# Patient Record
Sex: Male | Born: 1935 | Race: White | Hispanic: No | Marital: Married | Smoking: Former smoker
Health system: Southern US, Community
[De-identification: ages and names within clinical notes are randomized; demographics above are authoritative.]

## PROBLEM LIST (undated history)

## (undated) DIAGNOSIS — I739 Peripheral vascular disease, unspecified: Secondary | ICD-10-CM

## (undated) DIAGNOSIS — I509 Heart failure, unspecified: Secondary | ICD-10-CM

## (undated) DIAGNOSIS — IMO0002 Reserved for concepts with insufficient information to code with codable children: Secondary | ICD-10-CM

## (undated) DIAGNOSIS — C801 Malignant (primary) neoplasm, unspecified: Secondary | ICD-10-CM

## (undated) DIAGNOSIS — I219 Acute myocardial infarction, unspecified: Secondary | ICD-10-CM

## (undated) DIAGNOSIS — I2510 Atherosclerotic heart disease of native coronary artery without angina pectoris: Secondary | ICD-10-CM

## (undated) DIAGNOSIS — I10 Essential (primary) hypertension: Secondary | ICD-10-CM

## (undated) DIAGNOSIS — I82409 Acute embolism and thrombosis of unspecified deep veins of unspecified lower extremity: Secondary | ICD-10-CM

## (undated) DIAGNOSIS — I6529 Occlusion and stenosis of unspecified carotid artery: Secondary | ICD-10-CM

## (undated) DIAGNOSIS — J449 Chronic obstructive pulmonary disease, unspecified: Secondary | ICD-10-CM

## (undated) DIAGNOSIS — E785 Hyperlipidemia, unspecified: Secondary | ICD-10-CM

## (undated) HISTORY — DX: Occlusion and stenosis of unspecified carotid artery: I65.29

## (undated) HISTORY — DX: Essential (primary) hypertension: I10

## (undated) HISTORY — DX: Acute embolism and thrombosis of unspecified deep veins of unspecified lower extremity: I82.409

## (undated) HISTORY — DX: Hyperlipidemia, unspecified: E78.5

## (undated) HISTORY — PX: PR VEIN BYPASS GRAFT,AORTO-FEM-POP: 35551

## (undated) HISTORY — PX: KIDNEY SURGERY: SHX687

## (undated) HISTORY — DX: Atherosclerotic heart disease of native coronary artery without angina pectoris: I25.10

## (undated) HISTORY — PX: FEMORAL-POPLITEAL BYPASS GRAFT: SHX937

## (undated) HISTORY — DX: Peripheral vascular disease, unspecified: I73.9

## (undated) HISTORY — DX: Malignant (primary) neoplasm, unspecified: C80.1

## (undated) HISTORY — DX: Heart failure, unspecified: I50.9

## (undated) HISTORY — DX: Acute myocardial infarction, unspecified: I21.9

## (undated) HISTORY — DX: Reserved for concepts with insufficient information to code with codable children: IMO0002

## (undated) HISTORY — DX: Chronic obstructive pulmonary disease, unspecified: J44.9

---

## 1993-10-10 HISTORY — PX: AORTIC ARCH REPAIR: SHX256

## 2002-03-16 ENCOUNTER — Encounter: Payer: Self-pay | Admitting: *Deleted

## 2002-03-16 ENCOUNTER — Inpatient Hospital Stay (HOSPITAL_COMMUNITY): Admission: EM | Admit: 2002-03-16 | Discharge: 2002-03-20 | Payer: Self-pay | Admitting: *Deleted

## 2002-03-17 ENCOUNTER — Encounter: Payer: Self-pay | Admitting: *Deleted

## 2002-04-02 ENCOUNTER — Inpatient Hospital Stay (HOSPITAL_COMMUNITY): Admission: RE | Admit: 2002-04-02 | Discharge: 2002-04-04 | Payer: Self-pay | Admitting: *Deleted

## 2002-04-02 ENCOUNTER — Encounter: Payer: Self-pay | Admitting: *Deleted

## 2002-06-06 ENCOUNTER — Encounter: Admission: RE | Admit: 2002-06-06 | Discharge: 2002-06-06 | Payer: Self-pay | Admitting: *Deleted

## 2002-06-06 ENCOUNTER — Encounter: Payer: Self-pay | Admitting: *Deleted

## 2007-03-15 ENCOUNTER — Ambulatory Visit: Payer: Self-pay | Admitting: *Deleted

## 2007-10-11 DIAGNOSIS — I219 Acute myocardial infarction, unspecified: Secondary | ICD-10-CM

## 2007-10-11 HISTORY — DX: Acute myocardial infarction, unspecified: I21.9

## 2007-10-18 ENCOUNTER — Ambulatory Visit: Payer: Self-pay | Admitting: *Deleted

## 2008-05-15 ENCOUNTER — Ambulatory Visit: Payer: Self-pay | Admitting: *Deleted

## 2008-06-19 ENCOUNTER — Ambulatory Visit: Payer: Self-pay | Admitting: *Deleted

## 2009-01-01 ENCOUNTER — Ambulatory Visit: Payer: Self-pay | Admitting: *Deleted

## 2009-06-29 ENCOUNTER — Ambulatory Visit: Payer: Self-pay | Admitting: Surgery

## 2009-07-17 ENCOUNTER — Encounter: Payer: Self-pay | Admitting: Surgery

## 2009-07-17 ENCOUNTER — Ambulatory Visit: Payer: Self-pay | Admitting: Surgery

## 2009-07-17 ENCOUNTER — Inpatient Hospital Stay (HOSPITAL_COMMUNITY): Admission: RE | Admit: 2009-07-17 | Discharge: 2009-07-18 | Payer: Self-pay | Admitting: Surgery

## 2009-07-17 HISTORY — PX: CAROTID ENDARTERECTOMY: SUR193

## 2009-08-17 ENCOUNTER — Ambulatory Visit: Payer: Self-pay | Admitting: Surgery

## 2010-02-03 ENCOUNTER — Ambulatory Visit: Payer: Self-pay | Admitting: Surgery

## 2010-02-22 ENCOUNTER — Ambulatory Visit: Payer: Self-pay | Admitting: Surgery

## 2010-09-24 ENCOUNTER — Ambulatory Visit: Payer: Self-pay | Admitting: Vascular Surgery

## 2010-09-24 ENCOUNTER — Ambulatory Visit: Payer: Self-pay | Admitting: Surgery

## 2010-12-20 ENCOUNTER — Ambulatory Visit: Payer: Self-pay | Admitting: Surgery

## 2010-12-20 ENCOUNTER — Other Ambulatory Visit: Payer: Self-pay

## 2011-01-13 LAB — CBC
HCT: 44.9 % (ref 39.0–52.0)
Hemoglobin: 15.4 g/dL (ref 13.0–17.0)
MCHC: 34.2 g/dL (ref 30.0–36.0)
MCV: 91.9 fL (ref 78.0–100.0)
MCV: 92 fL (ref 78.0–100.0)
Platelets: 144 10*3/uL — ABNORMAL LOW (ref 150–400)
Platelets: 221 10*3/uL (ref 150–400)
RBC: 3.73 MIL/uL — ABNORMAL LOW (ref 4.22–5.81)
RBC: 4.89 MIL/uL (ref 4.22–5.81)
RDW: 13.6 % (ref 11.5–15.5)
WBC: 7.8 10*3/uL (ref 4.0–10.5)
WBC: 7.8 10*3/uL (ref 4.0–10.5)

## 2011-01-13 LAB — COMPREHENSIVE METABOLIC PANEL
ALT: 12 U/L (ref 0–53)
AST: 15 U/L (ref 0–37)
Albumin: 4.4 g/dL (ref 3.5–5.2)
Alkaline Phosphatase: 67 U/L (ref 39–117)
BUN: 19 mg/dL (ref 6–23)
CO2: 27 mEq/L (ref 19–32)
Calcium: 9.9 mg/dL (ref 8.4–10.5)
Chloride: 107 mEq/L (ref 96–112)
Creatinine, Ser: 1.54 mg/dL — ABNORMAL HIGH (ref 0.4–1.5)
GFR calc Af Amer: 53 mL/min — ABNORMAL LOW (ref >60)
GFR calc non Af Amer: 44 mL/min — ABNORMAL LOW (ref >60)
Glucose, Bld: 117 mg/dL — ABNORMAL HIGH (ref 70–99)
Potassium: 4.4 mEq/L (ref 3.5–5.1)
Sodium: 140 mEq/L (ref 135–145)
Total Bilirubin: 1 mg/dL (ref 0.3–1.2)
Total Protein: 6.8 g/dL (ref 6.0–8.3)

## 2011-01-13 LAB — URINALYSIS, ROUTINE W REFLEX MICROSCOPIC
Glucose, UA: NEGATIVE mg/dL
Ketones, ur: 15 mg/dL — AB
Protein, ur: NEGATIVE mg/dL

## 2011-01-13 LAB — BASIC METABOLIC PANEL
Calcium: 8.6 mg/dL (ref 8.4–10.5)
Chloride: 111 mEq/L (ref 96–112)
Creatinine, Ser: 1.53 mg/dL — ABNORMAL HIGH (ref 0.4–1.5)
GFR calc Af Amer: 54 mL/min — ABNORMAL LOW (ref >60)
GFR calc non Af Amer: 44 mL/min — ABNORMAL LOW (ref >60)

## 2011-01-13 LAB — TYPE AND SCREEN: Antibody Screen: NEGATIVE

## 2011-01-13 LAB — APTT: aPTT: 28 seconds (ref 24–37)

## 2011-01-13 LAB — PROTIME-INR: Prothrombin Time: 13.8 seconds (ref 11.6–15.2)

## 2011-01-13 LAB — ABO/RH: ABO/RH(D): O POS

## 2011-02-22 NOTE — Procedures (Signed)
BYPASS GRAFT EVALUATION   INDICATION:  Followup right fem-pop bypass graft.   HISTORY:  Diabetes:  No.  Cardiac:  MI.  Hypertension:  Yes.  Smoking:  Previous.  Previous Surgery:  Right fem-pop bypass graft in 2003, history of aorta  to bifem and aorta to birenal bypass graft.   SINGLE LEVEL ARTERIAL EXAM                               RIGHT              LEFT  Brachial:                    139                133  Anterior tibial:             122                82  Posterior tibial:            116                102  Peroneal:  Ankle/brachial index:        0.88               0.73   PREVIOUS ABI:  Date:  02/03/2010  RIGHT:  0.96  LEFT:  0.71   LOWER EXTREMITY BYPASS GRAFT DUPLEX EXAM:   DUPLEX:  Patent right femoral-popliteal bypass graft without evidence of  stenosis within the graft.  Elevated velocities up to 200 cm/s noted in  the native popliteal artery past the distal anastomosis.   IMPRESSION:  1. Patent right femoral-popliteal bypass graft.  2. Stable ankle brachial indices from previous exams.         ___________________________________________  V. Charlena Cross, MD   NT/MEDQ  D:  09/24/2010  T:  09/24/2010  Job:  [id number]

## 2011-02-22 NOTE — Procedures (Signed)
CAROTID DUPLEX EXAM   INDICATION:  Followup, carotid.   HISTORY:  Diabetes:  No.  Cardiac:  No.  Hypertension:  Yes.  Smoking:  No.  Previous Surgery:  Right fem-pop bypass graft.  CV History:  No.  Amaurosis Fugax No, Paresthesias No, Hemiparesis No.                                       RIGHT             LEFT  Brachial systolic pressure:         134               138  Brachial Doppler waveforms:         Normal            Normal  Vertebral direction of flow:        Antegrade         Antegrade  DUPLEX VELOCITIES (cm/sec)  CCA peak systolic                   75                80  ECA peak systolic                   380               152  ICA peak systolic                   311               138  ICA end diastolic                   70                29  PLAQUE MORPHOLOGY:                  Heterogenous      Heterogenous  PLAQUE AMOUNT:                      Moderate/severe   Moderate  PLAQUE LOCATION:                    ICA/ECA           ICA/ECA   IMPRESSION:  1. High-end 60-79% stenosis of the right internal carotid artery.  2. 40-59% stenosis of the left internal carotid artery.       ___________________________________________  P. Liliane Bade, M.D.   CH/MEDQ  D:  06/19/2008  T:  06/19/2008  Job:  [id number]

## 2011-02-22 NOTE — Procedures (Signed)
CAROTID DUPLEX EXAM   INDICATION:  Carotid disease.   HISTORY:  Diabetes:  No.  Cardiac:  No.  Hypertension:  Yes.  Smoking:  No.  Previous Surgery:  No carotid surgery.  CV History:  Asymptomatic.  Amaurosis Fugax No, Paresthesias No, Hemiparesis No.                                       RIGHT             LEFT  Brachial systolic pressure:         147               146  Brachial Doppler waveforms:         Normal            Normal  Vertebral direction of flow:        Antegrade         Antegrade  DUPLEX VELOCITIES (cm/sec)  CCA peak systolic                   90                85  ECA peak systolic                   398               160  ICA peak systolic                   547               170  ICA end diastolic                   126               36  PLAQUE MORPHOLOGY:                  Calcific          Calcific  PLAQUE AMOUNT:                      Severe            Moderate  PLAQUE LOCATION:                    ICA/ECA           ICA/ECA   IMPRESSION:  1. Doppler velocities suggest an 80-99% stenosis of the right internal      carotid artery.  2. 40-59% stenosis of the left internal carotid artery.  3. Significant increase in the right internal carotid artery Doppler      velocities noted when compared to the previous examination on      01/01/09 with the left internal carotid artery remaining stable.   ___________________________________________  V. Charlena Cross, MD   CH/MEDQ  D:  06/29/2009  T:  06/29/2009  Job:  [id number]

## 2011-02-22 NOTE — Procedures (Signed)
CAROTID DUPLEX EXAM   INDICATION:  Followup known carotid disease.   HISTORY:  Diabetes:  no  Cardiac:  MI  Hypertension:  yes  Smoking:  Previous  Previous Surgery:  Right carotid endarterectomy on 07/17/2009  CV History:  Asymptomatic  Amaurosis Fugax  No, Paresthesias No, Hemiparesis  No                                       RIGHT             LEFT  Brachial systolic pressure:         139               133  Brachial Doppler waveforms:         normal            normal  Vertebral direction of flow:        Antegrade         Antegrade  DUPLEX VELOCITIES (cm/sec)  CCA peak systolic                   105               78  ECA peak systolic                   147               164  ICA peak systolic                   100               132  ICA end diastolic                   33                39  PLAQUE MORPHOLOGY:                  heterogenous      heterogenous  PLAQUE AMOUNT:                      mild              mild  PLAQUE LOCATION:                    ICA / ECA         ICA / ECA   IMPRESSION:  1. Doppler velocities suggest 20% to 39% stenosis in the right      internal carotid artery, patent post carotid endarterectomy.  2. Doppler velocities suggest 40% to 59% stenosis in the left internal      carotid artery.  3. Bilateral vertebrals suggest antegrade flow.  4. No significant changes from previous exam.   ___________________________________________  V. Charlena Cross, MD   NT/MEDQ  D:  09/24/2010  T:  09/24/2010  Job:  [id number]

## 2011-02-22 NOTE — Procedures (Signed)
BYPASS GRAFT EVALUATION   INDICATION:  Followup, right femoral/popliteal artery bypass graft.   HISTORY:  Diabetes:  No.  Cardiac:  No.  Hypertension:  Yes.  Smoking:  No.  Previous Surgery:  Right femoral-popliteal artery bypass graft.   SINGLE LEVEL ARTERIAL EXAM                               RIGHT              LEFT  Brachial:                    158                160  Anterior tibial:             140                85  Posterior tibial:            130                95  Peroneal:  Ankle/brachial index:        0.88               0.60   PREVIOUS ABI:  Date: 10/18/07  RIGHT:  0.98  LEFT:  0.64   LOWER EXTREMITY BYPASS GRAFT DUPLEX EXAM:   DUPLEX:  1. Doppler arterial waveforms appear biphasic proximal to, within, and      distal to bypass graft.  2. Elevated velocities in native distal to bypass graft, 337 cm/s,      suggestive of >50% stenosis.   IMPRESSION:  1. Patent right femoral-popliteal artery bypass graft.  2. Elevated velocities in native distal to bypass graft, suggestive of      >50% stenosis.  3. Fairly stable ankle brachial indices bilaterally.   ___________________________________________  P. Liliane Bade, M.D.   AS/MEDQ  D:  05/15/2008  T:  05/15/2008  Job:  [id number]

## 2011-02-22 NOTE — Assessment & Plan Note (Signed)
OFFICE VISIT   Tyler [name], DALZIEL  DOB:  April 10, 1929                                       06/29/2009  ZOXWR#:[id number]   REASON FOR VISIT:  Follow up carotid.   HISTORY:  This is a 76 year old gentleman, former history of Dr. Madilyn Fireman.  He has a history of complex aortic reconstruction in 1995 with  aortobifemoral bypass graft, bilateral aortorenal bypass graft and a  right below-knee popliteal bypass.  This was carried out for a right  renal mass, bilateral renal artery stenosis and aortoiliac disease.  He  has been followed for right carotid stenosis.  He is asymptomatic.  He  denies numbness or weakness in either extremity.  He denies amaurosis  fugax.  He denies slurring of his speech.   On physical examination, blood pressure is 155/66, pulse is 60.  He is  well-appearing, in no distress.  He has no neurologic deficits.   Diagnostic studies:  The patient has a right carotid stenosis of 80% to  99% and a left of 40% to 59%.  There is a significant increase in the  right-sided velocities.   ASSESSMENT/PLAN:  Asymptomatic right carotid stenosis.   PLAN:  I discussed the natural history of extracranial cerebrovascular  disease with the patient.  I have recommended that we proceed with right  carotid endarterectomy.  I discussed the risks of stroke and nerve  injury.  At this point in time the patient is a little reluctant to  proceed and would like to contemplate his decision.  I am going to have  him come back to see me in 1 month.   Jorge Ny, MD  Electronically Signed   VWB/MEDQ  D:  06/29/2009  T:  06/30/2009  Job:  [id number]

## 2011-02-22 NOTE — Procedures (Signed)
CAROTID DUPLEX EXAM   INDICATION:  Follow up carotid artery disease.   HISTORY:  Diabetes:  No.  Cardiac:  No.  Hypertension:  Yes.  Smoking:  No.  Previous Surgery:  On 07/17/09.  CV History:  No.  Amaurosis Fugax No, Paresthesias No, Hemiparesis No.                                       RIGHT             LEFT  Brachial systolic pressure:         142               135  Brachial Doppler waveforms:         WNL               WNL  Vertebral direction of flow:        Antegrade         Antegrade  DUPLEX VELOCITIES (cm/sec)  CCA peak systolic                   128               97  ECA peak systolic                   140               172  ICA peak systolic                   114               146  ICA end diastolic                   36                33  PLAQUE MORPHOLOGY:                  Heterogenous      Heterogenous  PLAQUE AMOUNT:                      Mild              Mild  PLAQUE LOCATION:                    ICA, ECA          ICA, ECA   IMPRESSION:  1. Patent right carotid endarterectomy site with 20% to 39% stenosis.  2. Left internal carotid artery suggests 40% to 59% stenosis.  3. Antegrade flow in bilateral vertebrals.   ___________________________________________  V. Charlena Cross, MD   CB/MEDQ  D:  02/03/2010  T:  02/03/2010  Job:  [id number]

## 2011-02-22 NOTE — Assessment & Plan Note (Signed)
OFFICE VISIT   BURREL, LEGRAND  DOB:  [birth year]                                       08/17/2009  CHART#:[id number]   REASON FOR VISIT:  Follow up carotid.   HISTORY:  This is 77 year old gentleman with high-grade right carotid  stenosis.  He underwent right carotid endarterectomy.  This is his  postoperative visit.  He had an uncomplicated postoperative course.  He  comes in today without complaints.  His incision is well-healed.  He has  no neurological deficits.  I am going to see him back in 6 months with  repeat carotid ultrasound.  We are also following bilateral lower  extremities which have recently been evaluated.  This is a former  patient of Dr. Madilyn Fireman.   Jorge Ny, MD  Electronically Signed   VWB/MEDQ  D:  08/17/2009  T:  08/18/2009  Job:  [id number]

## 2011-02-22 NOTE — Procedures (Signed)
BYPASS GRAFT EVALUATION   INDICATION:  Follow-up evaluation of right fem-to-popliteal bypass  graft.  Patient reports bilateral hip claudication.   HISTORY:  Diabetes:  No.  Cardiac:  No.  Hypertension:  Yes.  Smoking:  No.  Previous Surgery:  Right fem-to-popliteal artery bypass graft by Dr.  Elyn Peers on March 17, 2002, with translocated nonreversed saphenous vein.  Patient also has a history of aortobifemoral and aortobirenal bypass  grafts.   SINGLE LEVEL ARTERIAL EXAM                               RIGHT              LEFT  Brachial:                    127                122  Anterior tibial:             115                78  Posterior tibial:            111                82  Peroneal:  Ankle/brachial index:        0.91               0.65   PREVIOUS ABI:  Date: 05/15/2008  RIGHT:  0.88  LEFT:  0.60   LOWER EXTREMITY BYPASS GRAFT DUPLEX EXAM:   DUPLEX:  Doppler arterial waveforms are biphasic proximal to, within and  distal to the right fem-to-popliteal bypass graft.   IMPRESSION:  1. ABIs are stable compared to previous study bilaterally.  2. Patent right fem-to-popliteal artery bypass graft.   ___________________________________________  P. Liliane Bade, M.D.   MC/MEDQ  D:  01/01/2009  T:  01/01/2009  Job:  [id number]

## 2011-02-22 NOTE — Procedures (Signed)
BYPASS GRAFT EVALUATION   INDICATION:  Follow up right fem-pop bypass graft.   HISTORY:  Diabetes:  No.  Cardiac:  No.  Hypertension:  Yes.  Smoking:  No.  Previous Surgery:  Right fem-pop bypass graft in 2003, history of aorto-  bifem and aorto-birenal bypass graft.   SINGLE LEVEL ARTERIAL EXAM                               RIGHT              LEFT  Brachial:                    142                135  Anterior tibial:             136                85  Posterior tibial:            128                101  Peroneal:  Ankle/brachial index:        0.96               0.71   PREVIOUS ABI:  Date: 06/29/2009  RIGHT:  1.0  LEFT:  0.73   LOWER EXTREMITY BYPASS GRAFT DUPLEX EXAM:   DUPLEX:  Patent right femoral-popliteal artery bypass graft with  biphasic waveforms noted proximal, within, and distal to the graft.   IMPRESSION:  1. Stable ankle brachial indices bilaterally.  2. Patent right femoral-popliteal bypass graft with no focal stenosis      noted.          ___________________________________________  V. Charlena Cross, MD   CB/MEDQ  D:  02/03/2010  T:  02/03/2010  Job:  [id number]

## 2011-02-22 NOTE — Procedures (Signed)
BYPASS GRAFT EVALUATION   INDICATION:  Right lower extremity bypass graft.   HISTORY:  Diabetes:  No.  Cardiac:  No.  Hypertension:  Yes.  Smoking:  No.  Previous Surgery:  Right fem-pop bypass graft in 2003, history of  aortobifem and aortobirenal bypass grafts.   SINGLE LEVEL ARTERIAL EXAM                               RIGHT              LEFT  Brachial:                    147                146  Anterior tibial:             141                96  Posterior tibial:            147                107  Peroneal:  Ankle/brachial index:        1.0                0.73   PREVIOUS ABI:  Date: 01/01/09  RIGHT:  0.91  LEFT:  0.65   LOWER EXTREMITY BYPASS GRAFT DUPLEX EXAM:   DUPLEX:  Biphasic Doppler waveforms noted throughout the right lower  extremity bypass graft and its native vessels with no increased  velocities noted.   IMPRESSION:  1. Patent right femoropopliteal bypass graft with no evidence of      stenosis.  2. Stable bilateral ankle brachial indices.     ___________________________________________  V. Charlena Cross, MD   CH/MEDQ  D:  06/29/2009  T:  06/29/2009  Job:  [id number]

## 2011-02-22 NOTE — Assessment & Plan Note (Signed)
OFFICE VISIT   Tyler [name], Tyler [name]  DOB:  03/14/1929                                       06/19/2008  CHART#:[id number]   The patient is a 78 year old gentleman well known to me with a history  of complex aortic reconstruction carried out in 1995 with aortobifemoral  bypass, bilateral aortorenal bypass and a right below knee popliteal  bypass.  This was carried out for a right renal mass, bilateral renal  artery stenosis and aortoiliac disease.   He returns at this time having recently been evaluated at [facility] in [place] with bilateral carotid bruits.  He underwent a  carotid Doppler evaluation today, this reveals elevated velocity in  right internal carotid artery consistent with a 60-79% stenosis.  Left  ICA reveals a 40-59% stenosis.  The patient has been free of any  symptoms.  He denies sensory, motor or visual deficit.  No speech  problems.  No gait abnormality.   He appears generally well.  BP 138/70, pulse is 68 per minute.  Alert  and oriented.  No acute distress.  Soft bilateral carotid bruits  audible.  Cranial nerves intact.  Strength equal bilaterally.  Heart  sounds are normal without murmurs.  Chest is clear with equal air entry  bilaterally.   The patient shows evidence of moderate right internal carotid artery  stenosis.  This is asymptomatic.  I have recommended continued followup  and will plan to see him again in 6 months with a carotid Doppler  evaluation.   Balinda Quails, M.D.  Electronically Signed   PGH/MEDQ  D:  06/19/2008  T:  06/20/2008  Job:  [id number]   cc:   Cristopher Estimable. Deterding, M.D.

## 2011-02-22 NOTE — Procedures (Signed)
CAROTID DUPLEX EXAM   INDICATION:  Follow-up evaluation of known cerebrovascular disease.   HISTORY:  Diabetes:  No.  Cardiac:  No.  Hypertension:  Yes.  Smoking:  No.  Previous Surgery:  Right femoropopliteal bypass graft.  CV History:  Patient reports no cerebrovascular symptoms.  Previous  duplex performed on 06/19/08 revealed a 60-79% right ICA stenosis and a  40-59% left ICA stenosis.  Amaurosis Fugax No, Paresthesias No hemiparesis No.                                       RIGHT             LEFT  Brachial systolic pressure:         127               122  Brachial Doppler waveforms:         Triphasic         Triphasic  Vertebral direction of flow:        Antegrade         Antegrade  DUPLEX VELOCITIES (cm/sec)  CCA peak systolic                   67                93  ECA peak systolic                   290               145  ICA peak systolic                   322               127  ICA end diastolic                   85                25  PLAQUE MORPHOLOGY:                  Mixed             Calcified  PLAQUE AMOUNT:                      Moderate-to-severe                  Moderate  PLAQUE LOCATION:                    Proximal-to-mid ICA                 Proximal ICA   IMPRESSION:  1. 60-79% right internal carotid artery stenosis.  2. 40-59% left internal carotid artery stenosis.  3. No significant change when compared to previous study performed on      06/19/08.   ___________________________________________  P. Liliane Bade, M.D.   MC/MEDQ  D:  01/01/2009  T:  01/01/2009  Job:  [id number]

## 2011-02-22 NOTE — Procedures (Signed)
BYPASS GRAFT EVALUATION   INDICATION:  Follow up of right fem-pop bypass graft.   HISTORY:  Diabetes:  No.  Cardiac:  No.  Hypertension:  Yes.  Smoking:  No.  Previous Surgery:  Please see above.   SINGLE LEVEL ARTERIAL EXAM                               RIGHT              LEFT  Brachial:                    134                138  Anterior tibial:             109                74  Posterior tibial:            124                88  Peroneal:  Ankle/brachial index:        0.98               0.64   PREVIOUS ABI:  Date:  03/15/2007  RIGHT:  0.98  LEFT:  0.77   LOWER EXTREMITY BYPASS GRAFT DUPLEX EXAM:   DUPLEX:  Patent right fem-pop bypass with no evidence of focal stenosis.   IMPRESSION:  1. Patent right fem-pop bypass graft with no evidence of focal      stenosis.  2. Mildly abnormal ABI with biphasic Doppler waveform noted in the      right leg.  Status post right      fem-pop bypass graft.  3. Moderately abnormal ABI with monophasic Doppler waveform noted in      the left leg.   ___________________________________________  P. Liliane Bade, M.D.   MG/MEDQ  D:  10/18/2007  T:  10/19/2007  Job:  [id number]

## 2011-02-22 NOTE — Assessment & Plan Note (Signed)
OFFICE VISIT   Tyler [name], Tyler [name]  DOB:  30-Dec-1928                                       02/22/2010  CHART#:[id number]   REASON FOR VISIT:  Follow-up.   HISTORY:  Patient is a 77 year old gentleman, former patient of Dr.  Madilyn Fireman, for complex aortic reconstruction in 1995.  He subsequently  underwent right fem-pop bypass graft in 2003.  I performed right carotid  endarterectomy.  He denies neurologic symptoms.  He continues to have  some swelling in his left leg, but overall he is doing quite well.   PHYSICAL EXAMINATION:  Heart rate 62, blood pressure 128/71,  respirations 20.  General:  He is well-appearing in no distress.  HEENT  within normal limits.  Lungs are clear bilaterally.  Cardiovascular:  Regular rate and rhythm.  No murmur.  No carotid bruits.  Abdomen:  Soft, nontender.  Musculoskeletal:  He has pitting edema of the right  leg.  Neuro is without focal deficits.  Skin:  Without rash.   DIAGNOSTIC STUDIES:  I have independently reviewed his ultrasound that  reveals ABI of 0.96 on the right and 0.71 on the left.  These are  essentially unchanged from his prior study.   His carotid duplex reveals a widely patent right carotid endarterectomy  site and 40% to 59% stenosis on the left.   ASSESSMENT/PLAN:  Peripheral artery disease.  The patient is stable with  regards to his ABIs.  At this time will continue with the protocol scan.  Carotid disease:  Patient's endarterectomy site is widely patent.  He  remains neurologically intact.  He has moderate disease on the left  side, which we will continue to monitor.  I will plan on seeing the  patient back in 1 year's time.     Jorge Ny, MD  Electronically Signed   VWB/MEDQ  D:  02/22/2010  T:  02/23/2010  Job:  [id number]   cc:   Fayrene Fearing L. Deterding, M.D.  Carolyn Linds

## 2011-02-25 NOTE — Op Note (Signed)
[facility]  Patient:    RANEY, KOEPPEN Visit Number: [id number] MRN: [id number]          Service Type: SUR Location: 2000 2023 01 Attending Physician:  Melvenia Needles Dictated by:   Denman George, M.D. Proc. Date: 04/02/02 Admit Date:  04/02/2002                             Operative Report  PREOPERATIVE DIAGNOSIS:  Lymphocele, right leg.  POSTOPERATIVE DIAGNOSIS:  Lymphocele, right leg.  OPERATION PERFORMED:  Drainage of lymphocele, right leg.  SURGEON:  Denman George, M.D.  ASSISTANT:  Adair Patter, P.A.  ANESTHESIA:  General endotracheal.  ANESTHESIOLOGIST:  Cliffton Asters. Ivin Booty, M.D.  INDICATIONS FOR PROCEDURE:  The patient is a 77 year old male who underwent a right femoral-popliteal bypass with Gore-Tex graft approximately two weeks ago.  He has developed a lymphocele in the right thigh which is draining.  He is brought to the operating room at this time for exploration and drainage.  DESCRIPTION OF PROCEDURE:  Patient brought to the operating room in stable condition.  Placed in supine position.  General endotracheal anesthesia induced.  Right leg prepped and draped in sterile fashion.  The draining lymphocele incision was opened throughout its length.  Cultures for aerobic and anaerobic bacteria obtained.  The lymphocele pocket was cauterized.  A 15 round Blake drain was in the lymphocele pocket, exited inferiorly and fixed to skin with 2-0 silk suture.  The pocket was then closed tightly over the lymphocele with interrupted 2-0 Vicryl suture for deep subcutaneous layer, interrupted 3-0 Vicryl suture for a superficial subcutaneous layer.  Skin closed with 3-0 vertical mattress nylon sutures.  Sterile dressing was applied.  The patient tolerated the procedure well.  Transferred to recovery room in stable condition. Dictated by:   Denman George, M.D. Attending Physician:  Melvenia Needles DD:  04/02/02 TD:   04/03/02 Job: [id number]

## 2011-02-25 NOTE — Discharge Summary (Signed)
[place]. [facility]  Patient:    Tyler [name], Tyler [name] Visit Number: [id number] MRN: [id number]          Service Type: SUR Location: 2000 2023 01 Attending Physician:  Melvenia Needles Dictated by:   Maxwell Marion, RNFA Admit Date:  04/02/2002 Discharge Date: 04/04/2002                             Discharge Summary  DATE OF BIRTH:  [birth year]  ADMISSION DIAGNOSIS:  Lymphocele, right popliteal space.  PAST MEDICAL HISTORY: 1. Aortoiliac occlusive disease, status post aortobifemoral bypass grafting. 2. Bilateral lower extremity peripheral vascular disease, status post right    femoral-popliteal bypass in 1995 by Dr. Madilyn Fireman, and redo right femoral to    above-the-knee popliteal bypass on March 20, 2002. 3. Hypertension. 4. History of renal cancer, status post resection of right renal mass. 5. Peptic ulcer disease. 6. Chronic constipation.  ALLERGIES:  PENICILLIN.  DISCHARGE DIAGNOSES:  Lymphocele, right popliteal right popliteal space, status post incision and drainage.  BRIEF HISTORY:  The patient is a 77 year old Caucasian man discharged on March 20, 2002, after a redo right femoral above-the-knee popliteal bypass. His hospital course was uneventful and he was discharged to home on postoperative day #3. He returned to the CVTS office on March 27, 2002, for skin staple removal. He was seen by Dr. Edilia Bo that day for concerns about his wound. Dr. Edilia Bo noticed him to have a small lymphocele adjacent to the above-the-knee incision. This was draining a small amount of serous fluid. Dr. Edilia Bo started him on a course of Keflex and asked the patient to return for a scheduled appointment with Dr. Madilyn Fireman in approximately 10 days. He returned to the CVTS office on April 02, 2002, with a large lymphocele in his right popliteal space.  HOSPITAL COURSE:  On April 02, 2002, the patient was admitted to [facility] in the care of Dr. Denman George. He underwent the following surgical procedure, incision and drainage of a lymphocele of the right popliteal space. Intraoperative cultures were sent for laboratory studies. At the conclusion of the procedure, a Jackson-Pratt drain was placed in the right popliteal space. The patient tolerated the procedure well and was transferred in stable condition to the PACU.  On the morning of postoperative day #1 the patient reports feeling well. His vital signs were stable and he was afebrile. His heart is in normal sinus rhythm. His lungs were clear to auscultation bilaterally. He was tolerating a regular diet. He is ambulating independently in his room and his pain is well controlled. His left leg surgical dressings are intact. The JP drain drained approximately 30 cc overnight. Cultures are pending.  PLAN:  If the patient continues to progress well, anticipate he will be ready for discharged to home tomorrow, April 04, 2002, with home health services for daily dressing changes as well as Jackson-Pratt drain emptying and recording of output.  CONDITION ON DISCHARGE:  Improved.  DISCHARGE INSTRUCTIONS:  Activity, he has been asked to refrain from any driving. He also has been instructed to continue his breathing exercises and daily walking. He is also to keep his right leg elevated when he is not walking. Diet should be low fat, low salt diet. Wound care, home health nurse will visit daily for his wound care.  DISCHARGE MEDICATIONS: 1. Tylox 1 to 2 p.o. q.4-6h. p.r.n. pain. He has been instructed to  resume his home medications of: 2. Procardia XL 60 mg 1 p.o. q.a.m., 2 p.o. q.p.m. 3. Cardura 4 mg 1 p.o. b.i.d. 4. Lipitor 10 mg 1 p.o. q.p.m. 5. Tagamet 200 mg p.o. b.i.d. 6. Aspirin 325 mg 1 p.o. q.d. 7. Multivitamin q.d.  FOLLOWUP:  He has been asked to keep his appointment to see Dr. Madilyn Fireman on Monday, April 08, 2002, at 11 a.m. Dictated by:   Maxwell Marion, RNFA Attending  Physician:  Melvenia Needles DD:  04/03/02 TD:  04/05/02 Job: [id number]

## 2011-02-25 NOTE — Discharge Summary (Signed)
[place]. [facility]  Patient:    Tyler [name], ORREGO Visit Number: [id number] MRN: [id number]          Service Type: SUR Location: 2000 2003 01 Attending Physician:  Caralee Ates. Dictated by:   Sherrie George, P.A. Admit Date:  03/16/2002 Discharge Date: 03/20/2002                             Discharge Summary  DATE OF BIRTH: 05-02-1929  ADMISSION DIAGNOSES: 1. Right lower extremity ischemia, status post right femoral popliteal bypass    graft with Gore-Tex below the knee in 1995. 2. Aorto iliac occlusive disease and renal artery stenosis, status post aorto-    femoral bypass grafting. 3. History of renal cancer, status post resection of right renal mass. 4. Peptic ulcer disease. 5. Hypertension. 6. History of ongoing tobacco use. 7. Chronic constipation.  DISCHARGE DIAGNOSES: 1. Right lower extremity ischemia, status post right femoral popliteal    graft with Gore-tex below the knee in 1995. 2. Aorto iliac occlusive disease and renal artery stenosis, status post    femoral bypass grafting. 3. History of renal cancer, status post resection of right renal mass. 4. Peptic ulcer disease. 5. Hypertension. 6. History of ongoing tobacco use. 7. Chronic constipation.  PROCEDURES: 1. Pelvic and right lower extremity arteriogram on March 16, 2002. 2. Redo right femoral to above the knee popliteal bypass graft with non-    reversed saphenous vein thrombectomy of the right profunda femoris    intraoperative arteriogram times two on March 17, 2002.  HISTORY OF PRESENT ILLNESS: The patient is a 77 year old white male who presented to the emergency room at [facility] with a two day history of progressive in his right lower extremity. He was evaluated by Dr. Elyn Peers and admitted and scheduled for arteriogram.  PAST MEDICAL HISTORY: 1. Hypertension. 2. Renal artery stenosis. 3. Aorto iliac occlusive disease with a previous aorto femoral bypass  grafting 4. Bilateral lower extremity peripheral vascular disease with a right femoral    to below the knee popliteal graft with Gore-tex in 1995 by Dr. Madilyn Fireman. 5. Resection of right renal mass. 6. History of tonsils and adenoids. 7. Lipoma excision.  SOCIAL HISTORY: Smoker for approximately 40 years. He is currently down to about 1-2 cigarettes per day. He also has a history of alcohol use.  MEDICATIONS: 1. Aspirin. 2. Lipitor. 3. Procardia. 4. Cardura.  ALLERGIES: PENICILLIN.  HOSPITAL COURSE: The patient was admitted and underwent arteriogram by radiology department. This showed a patent aortofemoral graft. The right femoral popliteal graft was occluded with thrombus/emboli in the profunda femoris. Posterior tibial artery reconstitutes at the adductor canal with anterior tibial and peroneal runoff. After reviewing the studies, it was Dr. Christean Leaf opinion that the patient should undergo a redo of failed above the knee popliteal in the AM. The risks and benefits were discussed. He was taken to the OR the next day and underwent the above noted procedure. He tolerated the procedure well and had a palpable DP postoperatively. The first postop morning, he was stable and somewhat hypokalemic. This was replaced. He was mobilized and transferred to the floor. He has been started on progressive ambulation. Ankle brachial indices postoperatively are 0.82 on the right and 0.76 on the left. He has made slow steady progress and it was Dr. Christean Leaf opinion that if he continued to do well and was walking without difficulty, he could go  home in the AM, March 20, 2002. Of note, during his hospitalization, his wife complained of preexisting constipation. He was given laxatives and we plan to discharge him home on Citrucel as an additional medication to his preadmission medications. The Tylox will be on a p.r.n. basis for pain only.  DISCHARGE MEDICATIONS: 1. Tagamet 200 mg b.i.d. 2. Aspirin 325 mg  q.d. 3. Lipitor 10 mg q.d. 4. Procardia 60 mg q.12h. 5. Cardura 8 mg q.12h. 6. Tylox one to two p.o. q.4h. p.r.n. 7. Citrucel one tablespoon b.i.d.  ACTIVITY: He is instructed to walk daily. No lifting over ten pounds. No driving. No strenuous activity.  DIET: To maintain a low fat diet.  WOUND CARE: Clean incision with plain soap and water.  FOLLOW-UP: Staple removal scheduled for out office on Wednesday, March 27, 2002 at 10:00 AM and he will follow-up with Dr. Elyn Peers on Friday, April 05, 2002 at 2:30 PM.  DISCHARGE CONDITION: Improving. Dictated by:   Sherrie George, P.A. Attending Physician:  Caralee Ates. DD:  03/19/02 TD:  03/21/02 Job: [id number]

## 2011-02-25 NOTE — Op Note (Signed)
[place]. [facility]  Patient:    Tyler [name], Tyler [name] Visit Number: [id number] MRN: [id number]          Service Type: SUR Location: 2000 2003 01 Attending Physician:  Caralee Ates Dictated by:   Caralee Ates, M.D. Proc. Date: 03/17/02 Admit Date:  03/16/2002                             Operative Report  PREOPERATIVE DIAGNOSIS:  Right lower extremity ischemia.  POSTOPERATIVE DIAGNOSIS:  Right lower extremity ischemia.  OPERATION PERFORMED: 1. Right femoral above knee artery popliteal bypass with translocated    nonreversed saphenous vein. 2. Left profunda thromboembolectomy. 3. Intraoperative arteriogram and completion arteriogram.  SURGEON:  Caralee Ates, M.D.  ASSISTANT:  Loura Pardon, P.A.  ANESTHESIA:  General endotracheal.  ESTIMATED BLOOD LOSS:  200 cc.  DRAINS:  None.  SPECIMENS:  None.  COMPLICATIONS:  None.  INDICATIONS FOR PROCEDURE:  The patient is a 77 year old gentleman who presented with acute onset of severe short distance claudication and intermittent rest pain on Saturday who had a history of an aortobifemoral bypass followed immediately by fem to below-knee popliteal bypass with PTFE. The PTFE bypass has been occluded chronically for several years.  An arteriogram demonstrated a patent right graft limb of the aortobifemoral and a long segment of superficial femoral artery occlusion with reconstitution of the above knee popliteal artery with two-vessel runoff to the foot via the anterior tibial and peroneal arteries.  The profunda was patent at its origin, however, had filling defects in its distal branches.  The patient was scheduled for a profunda thromboembolectomy as well as a femoral to above-knee artery popliteal bypass with greater saphenous vein.  DESCRIPTION OF PROCEDURE:  The patient was brought to the operating room and placed on the operating table in supine position.  Following adequate general endotracheal  anesthesia, the right lower extremity was prepped and draped circumferentially from the groin to the foot.  A vertical incision was made over the palpable femoral pulse.  The wound was deepened using Bovie to control to bleeding.  The aortobifemoral graft limb was identified, mobilized and encircled with an umbilical tape proximally.  Next, the native common femoral artery was mobilized deep to the graft limb circumferentially and encircled with a vessel loop.  The superficial femoral artery was similarly identified, mobilized and encircled with a vessel loop as was the profunda femoris artery which was a fairly small diminutive artery.  The chronically occluded PTFE graft was transected for further mobilization of the femoral vessels.  Next the saphenofemoral junction was exposed and the greater saphenous vein was exposed from that level to the level of the knee with intermittent skin bridges.  The vein appeared to be of excellent quality and all side branches were ligated between 4-0 silk ties.  The vein was left in situ at this point.  Next, through the saphenectomy wound, the above-knee popliteal space was entered and the popliteal artery was identified.  A 19 gauge butterfly was then used to cannulate the popliteal artery and a spot arteriogram was performed that demonstrated the patency of the artery at this level and to ensure that the bypass would be located distal to a moderate stenosis of the patent popliteal segment.  With this completed, the popliteal artery was mobilized and encircled with vessel loops proximally and distally. Next, the vein was harvested and its distal stump was ligated with a  2-0 silk tie.  The vein was then removed from the saphenofemoral junction and the resulting defect in the common femoral artery was oversewn with a running 5-0 Prolene suture.  Next, the vein was instilled with heparin saline solution and two small leaking side branches were tied with 4-0  silk ties.  Next, the long tunneler was then used to create subsartorial tunnel from the inferior wound and then brought out into the groin wound.  The patient was then systemically heparinized.  Following an adequate three-minute circulation time, the aortobifemoral graft limb and the native vessels were occluded.  The old PTFE graft anastomosis was then taken down.  The resulting defect in the aortobifemoral graft limb edges were freshened.  The vein was then brought on to the operative field and its proximal end was spatulated to the appropriate size and sewn into position in end-to-side fashion with a running 6-0 Prolene suture.  Following completion of this anastomosis, flow was restored to the graft limb and the native vessels.  There was no leak from the anastomotic suture line.  The vein was then distended  and all the valves were lysed with a [place] valvulotome.  Once all the valves had been lysed, there was noted to be excellent flow in the vein graft.  The vein graft was then occluded with a seraphim and marked on its anterior surface with a marking pen.  Next, the vein was then attached to the inner terser of the tunneler and passed in the tunneler sheath and brought out in the inferior wound.  Next, the loops around the popliteal artery were tightened to occlude flow.  A longitudinal arteriotomy was performed.  The lumen of the popliteal artery was widely patent and there was excellent brisk back-bleeding. The vein graft was then trimmed to the appropriate length, spatulated and sewn into position in end-to-side fashion with running 6-0 Prolene suture.  Prior to completion of the anastomosis, the native vessels were flushed and back-bled and the graft was flushed as well.  The anastomosis was then completed, clamps were released restoring flow.  At this point there was graft dependent flow in the dorsalis pedis and the posterior tibial and peroneal arteries.  Hemostasis was  achieved and then a completion arteriogram was performed which demonstrated a widely patent distal anastomosis and good run-off to the distal vasculature.  Next,  40 mg of protamine was administered.  Hemostasis was achieved in the wounds. The wounds were irrigated with warm sterile saline and closed in layers of 2 and 3-0 Vicryl suture followed by skin closure with skin staples.  Sterile dry dressings were applied.  The patient was noted to have a palpable dorsalis pedis pulse at this point.  The patient was then awakened from anesthesia and transferred to the recovery room in stable condition.  The patient tolerated the procedure well.  There were no complications.  All sponge and needle counts were reported as correct. Dictated by:   Caralee Ates, M.D. Attending Physician:  Caralee Ates. DD:  03/17/02 TD:  03/19/02 Job: [id number]

## 2011-02-25 NOTE — H&P (Signed)
[place]. [facility]  Patient:    Tyler [name], Tyler [name] Visit Number: [id number] MRN: [id number]          Service Type: SUR Location: 2000 2003 01 Attending Physician:  Caralee Ates Dictated by:   Caralee Ates, M.D. Admit Date:  03/16/2002 Discharge Date: 03/20/2002                           History and Physical  ADMITTING DIAGNOSIS: Right lower extremity ischemia.  HISTORY OF PRESENT ILLNESS: The patient is a 77 year old white gentleman who presented to the emergency department with a two day history of progressive right calf pain.  He noted that he began developing this progressive pain on Thursday with ambulation and has had greater difficulty over the past two days to the point where he essentially cannot ambulate.  Despite this he denies rest pain.  He also noted that his right foot had become cool, with a pallor appearance.   He denied any recent illness or injury.  This patient has had a known aortobifemoral bypass graft as well as a chronically occluded right femoral to below-the-knee popliteal artery bypass graft that was performed by Dr. Liliane Bade in 1995.  The patient also had bilateral renal artery bypass at the same setting.  CURRENT MEDICATIONS:  1. Aspirin.  2. Lipitor.  3. Procardia.  4. Cardura.  ALLERGIES: PENICILLIN.  PAST MEDICAL HISTORY:  1. Hypertension.  2. Renal artery stenosis.  3. Aortoiliac occlusive disease.  4. Bilateral lower extremity peripheral vascular disease.  5. Right renal cancer.  6. Peptic ulcer disease.  PAST SURGICAL HISTORY:  1. Aortobifemoral bypass grafting.  2. Biopsy renal artery bypass.  3. Right femoral below-the-knee artery popliteal bypass with PTFE     (Dr. Liliane Bade, 1995).  4. Resection of a right renal mass at that same time.  5. Tonsillectomy and adenoidectomy.  6. Lipoma excision.  SOCIAL HISTORY: He reports an approximate 40 year history of cigarette smoking; however, currently only  smokes one to two cigarettes a day.  He also has occasional alcohol use.  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  FAMILY HISTORY: There is a history of hypertension, coronary artery disease, Alzheimers disease, abdominal aortic aneurysm, arthritis, seizure disorders, and diabetes.  REVIEW OF SYSTEMS: CONSTITUTIONAL: This is a well-developed 77 year old white gentleman.  He denies any recent fever, chills, nausea, vomiting, or change in his bowel or bladder habits.  HEENT: Negative.  CARDIOVASCULAR: History of hypertension, although no acute coronary syndromes.  PULMONARY: Long history of cigarette smoking though does not have a known diagnosis of any acute or chronic pulmonary disease.  RENAL: History of renal artery hypertension as well as a right renal tumor which has been resected.  He did not require any chemotherapy or radiation therapy for this.  He did have occlusive disease, having undergone aortobifemoral bypass grafting.  ENDOCRINE: Negative. PSYCHIATRIC: Negative.  SKIN: No ischemic ulceration.  MUSCULOSKELETAL: Negative.  PHYSICAL EXAMINATION:  VITAL SIGNS: He is afebrile.  His blood pressure is 148/69.  His heart rate is 83 and regular, and his respirations are 20.  GENERAL: This is a 77 year old white gentleman who is awake and alert, in no acute distress; however, is somewhat anxious.  HEENT: Negative.  CHEST: Clear bilaterally.  CV: Regular rate and rhythm.  ABDOMEN: Soft, nontender, with no palpable masses.  EXTREMITIES: Warm and dry with  the exception of his right lower extremity, which is cool from approximately the ankle down.  The right foot has a pallor appearance.  He has good range of motion in his ankle and at his knee.  NEUROVASCULAR: He has 2+ femoral pulses bilaterally, popliteal pulses are not palpable, and he has a monophasic Doppler flow in the posterior tibial artery on the  right and biphasic Doppler flow in the DP and PT on the left.  LABORATORY DATA: Laboratories are pending at this time.  IMPRESSION: Right lower extremity ischemia with severe short distance progressive claudication.  He has a chronically occluded right femoropopliteal and undoubtedly has had occlusion of the profunda outflow tract.  While he has a femoral pulse on the right I suspect this graft limb is at risk for thrombosis.  PLAN:  1. I have discussed the situation with the family and have recommended that     he undergo an arteriogram to determine exactly what the etiology of his     problem is.  The family is extremely adamant that they do not wish to have     an arteriogram at this time due to the fact that he had some distal     embolization to his feet from his previous arteriogram several years ago.     With that in mind I have asked the vascular laboratory to come in to     perform a duplex study of the right femoral region to determine if the     profunda outflow tract is patent and to see if they can help me determine     exactly what the problem with the flow into the right leg is.  I suspect     that he is going to wind up with an arteriogram at any rate.  2. Plan systemic heparinization until the etiology of his right lower     extremity can be determined and resolved.  3. He will likely require a right femoral exploration with revision of the     outflow tract to the right limb of his aortobifemoral bypass graft. Dictated by:   Caralee Ates, M.D. Attending Physician:  Caralee Ates. DD:  03/16/02 TD:  03/18/02 Job: [id number]

## 2011-09-07 ENCOUNTER — Encounter: Payer: Self-pay | Admitting: Surgery

## 2011-09-26 ENCOUNTER — Ambulatory Visit: Payer: Self-pay | Admitting: Surgery

## 2011-09-26 ENCOUNTER — Other Ambulatory Visit: Payer: Self-pay

## 2011-10-14 ENCOUNTER — Encounter: Payer: Self-pay | Admitting: Surgery

## 2011-10-17 ENCOUNTER — Ambulatory Visit: Payer: Medicare Other | Admitting: Surgery

## 2011-10-17 ENCOUNTER — Other Ambulatory Visit (INDEPENDENT_AMBULATORY_CARE_PROVIDER_SITE_OTHER): Payer: Medicare Other | Admitting: *Deleted

## 2011-10-17 ENCOUNTER — Ambulatory Visit (INDEPENDENT_AMBULATORY_CARE_PROVIDER_SITE_OTHER): Payer: Medicare Other | Admitting: *Deleted

## 2011-10-17 DIAGNOSIS — I6529 Occlusion and stenosis of unspecified carotid artery: Secondary | ICD-10-CM

## 2011-10-17 DIAGNOSIS — Z48812 Encounter for surgical aftercare following surgery on the circulatory system: Secondary | ICD-10-CM

## 2011-10-17 DIAGNOSIS — I739 Peripheral vascular disease, unspecified: Secondary | ICD-10-CM

## 2011-11-01 ENCOUNTER — Other Ambulatory Visit: Payer: Self-pay | Admitting: *Deleted

## 2011-11-01 DIAGNOSIS — I739 Peripheral vascular disease, unspecified: Secondary | ICD-10-CM

## 2011-11-01 DIAGNOSIS — I6529 Occlusion and stenosis of unspecified carotid artery: Secondary | ICD-10-CM

## 2011-11-01 DIAGNOSIS — Z48812 Encounter for surgical aftercare following surgery on the circulatory system: Secondary | ICD-10-CM

## 2011-11-02 ENCOUNTER — Encounter: Payer: Self-pay | Admitting: Surgery

## 2011-11-02 NOTE — Procedures (Unsigned)
CAROTID DUPLEX EXAM  INDICATION:  Follow up carotid artery disease.  HISTORY: Diabetes:  No. Cardiac:  MI. Hypertension:  Yes. Smoking:  Previous. Previous Surgery:  Right carotid endarterectomy, 07/17/2009. CV History:  Currently asymptomatic. Amaurosis Fugax No, Paresthesias No, Hemiparesis No.                                      RIGHT             LEFT Brachial systolic pressure:         131               133 Brachial Doppler waveforms:         Normal            Normal Vertebral direction of flow:        Antegrade         Antegrade DUPLEX VELOCITIES (cm/sec) CCA peak systolic                   80                71 ECA peak systolic                   113               133 ICA peak systolic                   80                81 ICA end diastolic                   26                25 PLAQUE MORPHOLOGY:                                    Heterogenous PLAQUE AMOUNT:                      None              Mild PLAQUE LOCATION:                                      ICA, ECA  IMPRESSION: 1. Patent right carotid endarterectomy site with no evidence of     restenosis of the internal carotid artery. 2. Left internal carotid artery velocities suggest 1% to 39% stenosis. 3. Antegrade vertebral arteries bilaterally.  ___________________________________________ V. Charlena Cross, MD  EM/MEDQ  D:  10/17/2011  T:  10/17/2011  Job:  [id number]

## 2011-11-02 NOTE — Procedures (Unsigned)
BYPASS GRAFT EVALUATION  INDICATION:  Follow up right lower extremity femoral to popliteal bypass graft.  HISTORY: Diabetes:  No. Cardiac:  MI. Hypertension:  Yes. Smoking:  Previous. Previous Surgery:  Right femoral to popliteal bypass graft placed in 2003.  SINGLE LEVEL ARTERIAL EXAM                              RIGHT              LEFT Brachial:                    131                133 Anterior tibial:             127                93 Posterior tibial:            124                96 Peroneal: Ankle/brachial index:        0.95               0.72  PREVIOUS ABI:  Date:  09/24/10  RIGHT:  0.88  LEFT:  0.73   LOWER EXTREMITY BYPASS GRAFT DUPLEX EXAM:  DUPLEX:  Patent right lower extremity femoral to popliteal bypass graft.  IMPRESSION: 1. Patent right lower extremity femoral to popliteal bypass graft with     biphasic waveforms noted throughout the bypass graft.  Right ankle     brachial indices are suggestive of mild arterial disease. 2. Left ankle brachial indices are suggestive of moderate arterial     disease. 3. Ankle brachial indices are stable in comparison to the last     examination.         ___________________________________________ V. Charlena Cross, MD  EM/MEDQ  D:  10/17/2011  T:  10/17/2011  Job:  [id number]

## 2012-09-28 ENCOUNTER — Encounter: Payer: Self-pay | Admitting: Neurosurgery

## 2012-10-01 ENCOUNTER — Ambulatory Visit (INDEPENDENT_AMBULATORY_CARE_PROVIDER_SITE_OTHER): Payer: Medicare Other | Admitting: Neurosurgery

## 2012-10-01 ENCOUNTER — Encounter: Payer: Self-pay | Admitting: Neurosurgery

## 2012-10-01 ENCOUNTER — Encounter (INDEPENDENT_AMBULATORY_CARE_PROVIDER_SITE_OTHER): Payer: Medicare Other | Admitting: *Deleted

## 2012-10-01 ENCOUNTER — Other Ambulatory Visit (INDEPENDENT_AMBULATORY_CARE_PROVIDER_SITE_OTHER): Payer: Medicare Other | Admitting: *Deleted

## 2012-10-01 ENCOUNTER — Ambulatory Visit: Payer: Medicare Other | Admitting: Surgery

## 2012-10-01 VITALS — BP 116/64 | HR 54 | Resp 16 | Ht 67.0 in | Wt 162.0 lb

## 2012-10-01 DIAGNOSIS — I739 Peripheral vascular disease, unspecified: Secondary | ICD-10-CM

## 2012-10-01 DIAGNOSIS — I6529 Occlusion and stenosis of unspecified carotid artery: Secondary | ICD-10-CM

## 2012-10-01 DIAGNOSIS — Z48812 Encounter for surgical aftercare following surgery on the circulatory system: Secondary | ICD-10-CM

## 2012-10-01 NOTE — Progress Notes (Signed)
[facility]  [place] Office Note  CC: Carotid surveillance with right lower extremity bypass graft and ABIs Referring Physician: Brabham  History of Present Illness: 78 year old male patient of Dr. Myra Gianotti status post aortobifem bypass graft in 1995 with a right femoropopliteal bypass in 2003. The patient also had a right CEA in 2010. The patient denies any lower extremity pain, he has no claudication or rest pain and no open ulcerations. The patient denies any signs or symptoms of CVA, TIA, amaurosis fugax or any neural deficit.  Past Medical History  Diagnosis Date  . Hypertension   . Myocardial infarction 2009  . CHF (congestive heart failure)   . Hyperlipidemia   . Carotid artery occlusion   . Ulcer   . Cancer     kidney  . Peripheral vascular disease   . COPD (chronic obstructive pulmonary disease)   . Coronary artery disease   . DVT (deep venous thrombosis)     ROS: [x]  Positive   [ ]  Denies    General: [ ]  Weight loss, [ ]  Fever, [ ]  chills Neurologic: [ ]  Dizziness, [ ]  Blackouts, [ ]  Seizure [ ]  Stroke, [ ]  "Mini stroke", [ ]  Slurred speech, [ ]  Temporary blindness; [ ]  weakness in arms or legs, [ ]  Hoarseness Cardiac: [ ]  Chest pain/pressure, [ ]  Shortness of breath at rest [ ]  Shortness of breath with exertion, [ ]  Atrial fibrillation or irregular heartbeat Vascular: [ ]  Pain in legs with walking, [ ]  Pain in legs at rest, [ ]  Pain in legs at night,  [ ]  Non-healing ulcer, [ ]  Blood clot in vein/DVT,   Pulmonary: [ ]  Home oxygen, [ ]  Productive cough, [ ]  Coughing up blood, [ ]  Asthma,  [ ]  Wheezing Musculoskeletal:  [ ]  Arthritis, [ ]  Low back pain, [ ]  Joint pain Hematologic: [ ]  Easy Bruising, [ ]  Anemia; [ ]  Hepatitis Gastrointestinal: [ ]  Blood in stool, [ ]  Gastroesophageal Reflux/heartburn, [ ]  Trouble swallowing Urinary: [ ]  chronic Kidney disease, [ ]  on HD - [ ]  MWF or [ ]  TTHS, [ ]  Burning with urination, [ ]  Difficulty  urinating Skin: [ ]  Rashes, [ ]  Wounds Psychological: [ ]  Anxiety, [ ]  Depression   Social History History  Substance Use Topics  . Smoking status: Former Smoker -- 1.0 packs/day for 40 years    Types: Cigarettes    Quit date: 10/11/1999  . Smokeless tobacco: Never Used  . Alcohol Use: No    Family History Family History  Problem Relation Age of Onset  . Hypertension Other   . Coronary artery disease Other   . Alzheimer's disease Other   . Other Other     AAA  . Arthritis Other   . Diabetes Other   . Seizures Other   . Heart disease Father     Aneurysm, Abdominal Aortic    Allergies  Allergen Reactions  . Penicillins Swelling and Rash    Current Outpatient Prescriptions  Medication Sig Dispense Refill  . aspirin EC 81 MG tablet Take 81 mg by mouth daily.        . carvedilol (COREG) 6.25 MG tablet Take 6.25 mg by mouth 2 (two) times daily with a meal.        . dicyclomine (BENTYL) 10 MG capsule Take 10 mg by mouth 4 (four) times daily -  before meals and at bedtime.      Marland Kitchen doxazosin (CARDURA) 4 MG tablet Take  4 mg by mouth at bedtime.        . Multiple Vitamin (MULTIVITAMIN) capsule Take 1 capsule by mouth daily.        Marland Kitchen NIFEdipine (PROCARDIA XL/ADALAT-CC) 60 MG 24 hr tablet Take 60 mg by mouth 2 (two) times daily.        . nitroGLYCERIN (NITROSTAT) 0.4 MG SL tablet Place 0.4 mg under the tongue every 5 (five) minutes as needed.        Marland Kitchen omeprazole (PRILOSEC) 20 MG capsule Take 20 mg by mouth daily.        . simvastatin (ZOCOR) 20 MG tablet Take 20 mg by mouth at bedtime.          Physical Examination  Filed Vitals:   10/01/12 1107  BP: 116/64  Pulse: 54  Resp:     Body mass index is 25.37 kg/(m^2).  General:  WDWN in NAD Gait: Normal HEENT: WNL Eyes: Pupils equal Pulmonary: normal non-labored breathing , without Rales, rhonchi,  wheezing Cardiac: RRR, without  Murmurs, rubs or gallops; Abdomen: soft, NT, no masses Skin: no rashes, ulcers  noted  Vascular Exam Pulses: Palpable femoral pulses bilaterally, palpable dorsalis pedis Carotid bruits: Carotid pulses to auscultation no bruits are heard Extremities without ischemic changes, no Gangrene , no cellulitis; no open wounds;  Musculoskeletal: no muscle wasting or atrophy   Neurologic: A&O X 3; Appropriate Affect ; SENSATION: normal; MOTOR FUNCTION:  moving all extremities equally. Speech is fluent/normal  Non-Invasive Vascular Imaging CAROTID DUPLEX 10/01/2012  Right ICA 0 - 19% stenosis Left ICA 20 - 39 % stenosis ABIs today are 0.96 and triphasic to biphasic on the right, 0.74 on the left which is virtually unchanged from previous exam  ASSESSMENT/PLAN: Asymptomatic patient that will followup in 6 months for repeat graft duplex and ABIs due to some elevation in velocity of the distal anastomosis. He will followup in one year for repeat carotid duplex. The patient's questions were encouraged and answered, he is in agreement with this plan.  Lauree Chandler ANP   Clinic MD: Myra Gianotti

## 2012-10-01 NOTE — Addendum Note (Signed)
Addended by: Lorin Mercy K on: 10/01/2012 02:00 PM   Modules accepted: Orders

## 2012-10-22 ENCOUNTER — Ambulatory Visit: Payer: Medicare Other | Admitting: Surgery

## 2012-10-22 ENCOUNTER — Other Ambulatory Visit: Payer: Medicare Other

## 2012-11-05 ENCOUNTER — Ambulatory Visit: Payer: Medicare Other | Admitting: Surgery

## 2012-11-05 ENCOUNTER — Other Ambulatory Visit: Payer: Medicare Other

## 2012-12-28 ENCOUNTER — Encounter: Payer: Self-pay | Admitting: Surgery

## 2012-12-31 ENCOUNTER — Ambulatory Visit (INDEPENDENT_AMBULATORY_CARE_PROVIDER_SITE_OTHER): Payer: Medicare Other | Admitting: Surgery

## 2012-12-31 ENCOUNTER — Encounter: Payer: Self-pay | Admitting: Surgery

## 2012-12-31 ENCOUNTER — Encounter (INDEPENDENT_AMBULATORY_CARE_PROVIDER_SITE_OTHER): Payer: Medicare Other | Admitting: *Deleted

## 2012-12-31 VITALS — BP 140/58 | HR 95 | Ht 67.0 in | Wt 165.8 lb

## 2012-12-31 DIAGNOSIS — I739 Peripheral vascular disease, unspecified: Secondary | ICD-10-CM

## 2012-12-31 DIAGNOSIS — Z48812 Encounter for surgical aftercare following surgery on the circulatory system: Secondary | ICD-10-CM

## 2012-12-31 NOTE — Progress Notes (Signed)
[facility] [place]   Patient name: Tyler [name] MRN: [id number] DOB: 23-Jan-1929 Sex: male     Chief Complaint  Patient presents with  . Re-evaluation    3 month f/u    HISTORY OF PRESENT ILLNESS: The patient is back today for followup. He is a former patient of Dr. Madilyn Fireman. He is status post complex aortic reconstruction with an aortobifemoral bypass graft and bilateral renal artery bypass graft. He is also status post right femoral-popliteal bypass graft in 1995 and 2003. I performed a right carotid endarterectomy for asymptomatic stenosis. He comes in today without complaints. Approximately 100 yard she does experience pain in his buttocks. He has no neurologic symptoms.  Past Medical History  Diagnosis Date  . Hypertension   . Myocardial infarction 2009  . CHF (congestive heart failure)   . Hyperlipidemia   . Carotid artery occlusion   . Ulcer   . Cancer     kidney  . Peripheral vascular disease   . COPD (chronic obstructive pulmonary disease)   . Coronary artery disease   . DVT (deep venous thrombosis)     Past Surgical History  Procedure Laterality Date  . Femoral-popliteal bypass graft    . Carotid endarterectomy  07/17/2009    right  . Pr vein bypass graft,aorto-fem-pop    . Aortic arch repair    . Kidney surgery      resection of right renal mass    History   Social History  . Marital Status: Married    Spouse Name: N/A    Number of Children: N/A  . Years of Education: N/A   Occupational History  . Not on file.   Social History Main Topics  . Smoking status: Former Smoker -- 1.00 packs/day for 40 years    Types: Cigarettes    Quit date: 10/11/1999  . Smokeless tobacco: Never Used  . Alcohol Use: No  . Drug Use: No  . Sexually Active: Not on file   Other Topics Concern  . Not on file   Social History Narrative  . No narrative on file    Family History  Problem Relation Age of Onset  . Hypertension Other   . Coronary  artery disease Other   . Alzheimer's disease Other   . Other Other     AAA  . Arthritis Other   . Diabetes Other   . Seizures Other   . Heart disease Father     Aneurysm, Abdominal Aortic    Allergies as of 12/31/2012 - Review Complete 12/31/2012  Allergen Reaction Noted  . Penicillins Swelling and Rash 09/07/2011    Current Outpatient Prescriptions on File Prior to Visit  Medication Sig Dispense Refill  . aspirin EC 81 MG tablet Take 81 mg by mouth daily.        . carvedilol (COREG) 6.25 MG tablet Take 6.25 mg by mouth 2 (two) times daily with a meal.        . dicyclomine (BENTYL) 10 MG capsule Take 10 mg by mouth 4 (four) times daily -  before meals and at bedtime.      Marland Kitchen doxazosin (CARDURA) 4 MG tablet Take 4 mg by mouth at bedtime.        . Multiple Vitamin (MULTIVITAMIN) capsule Take 1 capsule by mouth daily.        Marland Kitchen NIFEdipine (PROCARDIA XL/ADALAT-CC) 60 MG 24 hr tablet Take 60 mg by mouth 2 (two) times daily.        Marland Kitchen  omeprazole (PRILOSEC) 20 MG capsule Take 20 mg by mouth daily.        . simvastatin (ZOCOR) 20 MG tablet Take 20 mg by mouth at bedtime.        . nitroGLYCERIN (NITROSTAT) 0.4 MG SL tablet Place 0.4 mg under the tongue every 5 (five) minutes as needed.         No current facility-administered medications on file prior to visit.     REVIEW OF SYSTEMS: Please see history of present illness, otherwise all systems negative  PHYSICAL EXAMINATION:   Vital signs are BP 140/58  Pulse 95  Ht 5\' 7"  (1.702 m)  Wt 165 lb 12.8 oz (75.206 kg)  BMI 25.96 kg/m2  SpO2 100% General: The patient appears their stated age. HEENT:  No gross abnormalities Pulmonary:  Non labored breathing Musculoskeletal: There are no major deformities. Neurologic: No focal weakness or paresthesias are detected, Skin: There are no ulcer or rashes noted. Psychiatric: The patient has normal affect. Cardiovascular: There is a regular rate and rhythm without significant murmur appreciated.  No carotid bruits. Palpable right dorsalis pedis pulse   Diagnostic Studies Duplex ultrasound was ordered and reviewed today. His ABIs remain unchanged at 0.97. We have been following an area in the right popliteal artery for a progressive increase in velocities. Today's ultrasound suggest that this is in the native popliteal artery below the anastomosis. Peak velocity today was 2 78 cm/s  Assessment: Status post aortic reconstruction and right femoral-popliteal bypass graft and right carotid endarterectomy Plan: I have the the patient's most recent carotid ultrasound in December of 2013. This remained stable. From a lower extremity perspective, he remains essentially asymptomatic. We are following a progressively stenotic lesion in the right popliteal artery. It is uncertain as to whether this is the distal anastomosis of his right femoral-popliteal bypass graft or whether this is the native artery. Regardless, the patient remains asymptomatic, he has an ABI of 0.97, and a palpable pulse. I would not recommend intervention at this time but rather close surveillance. He would potentially require antegrade access to address this because of his aortobifemoral bypass graft. I did discuss these findings with the patient. He will follow with me in 3 months for a repeat duplex ultrasound.  Jorge Ny, M.D. [facility] [place] Office: [phone] Pager:  [phone]

## 2013-01-01 NOTE — Addendum Note (Signed)
Addended by: Adria Dill L on: 01/01/2013 02:04 PM   Modules accepted: Orders

## 2013-01-07 ENCOUNTER — Encounter: Payer: Self-pay | Admitting: Nephrology

## 2013-02-27 ENCOUNTER — Telehealth: Payer: Self-pay | Admitting: Surgery

## 2013-02-27 NOTE — Telephone Encounter (Signed)
Appointment scheduled 04/08/13 @ 2 pm.  Tyler [name] is aware.   Juliette Alcide

## 2013-02-27 NOTE — Telephone Encounter (Signed)
Message copied by Allegra Grana on Wed Feb 27, 2013 11:53 AM ------      Message from: Vanetta Shawl      Created: Wed Feb 27, 2013  9:18 AM      Regarding: FW: Do we need to add carotid duplex                   ----- Message -----         From: Nada Libman, MD         Sent: 02/26/2013   8:47 PM           To: Fayne Norrie, RT,RVT,RDMS      Subject: RE: Do we need to add carotid duplex                     Just repeat carotid duplex.   If I have a slot earlier, he can have it, otherwise keep his appointment      ----- Message -----         From: Fayne Norrie, RT,RVT,RDMS         Sent: 02/26/2013   2:57 PM           To: Nada Libman, MD      Subject: Do we need to add carotid duplex                         Dr. Myra Gianotti,      This patient is coming to see you and with ABI's and duplex of right FPBG on 5/23.  He had carotid duplex on 10/07/12 s/p right CE in 2010 and is scheduled for repeat carotid duplex on 10/14/13.  He was told recently at [facility] that he had a "cholesterol patch" in his left eye.  [facility].  Do you want [place] to add a carotid duplex to the appt on 04/01/13?  Does he need to come in any earlier than 04/01/13?  Previous carotid duplex revealed widely patent right ICA with <40% LICA stenosis.              Thanks.      Eber Jones       ------

## 2013-04-01 ENCOUNTER — Ambulatory Visit: Payer: Medicare Other | Admitting: Neurosurgery

## 2013-04-01 ENCOUNTER — Ambulatory Visit: Payer: Medicare Other | Admitting: Surgery

## 2013-04-05 ENCOUNTER — Encounter: Payer: Self-pay | Admitting: Surgery

## 2013-04-08 ENCOUNTER — Ambulatory Visit (INDEPENDENT_AMBULATORY_CARE_PROVIDER_SITE_OTHER): Payer: Medicare Other | Admitting: Surgery

## 2013-04-08 ENCOUNTER — Encounter (INDEPENDENT_AMBULATORY_CARE_PROVIDER_SITE_OTHER): Payer: Medicare Other | Admitting: *Deleted

## 2013-04-08 ENCOUNTER — Other Ambulatory Visit (INDEPENDENT_AMBULATORY_CARE_PROVIDER_SITE_OTHER): Payer: Medicare Other | Admitting: *Deleted

## 2013-04-08 ENCOUNTER — Encounter: Payer: Self-pay | Admitting: Surgery

## 2013-04-08 VITALS — BP 120/60 | HR 61 | Ht 67.0 in | Wt 165.5 lb

## 2013-04-08 DIAGNOSIS — I6529 Occlusion and stenosis of unspecified carotid artery: Secondary | ICD-10-CM

## 2013-04-08 DIAGNOSIS — I739 Peripheral vascular disease, unspecified: Secondary | ICD-10-CM

## 2013-04-08 DIAGNOSIS — Z48812 Encounter for surgical aftercare following surgery on the circulatory system: Secondary | ICD-10-CM

## 2013-04-08 NOTE — Progress Notes (Signed)
[facility] [place] HISTORY AND PHYSICAL   CC:  F/u carotid duplex and lower extremity arterial duplex and states he has no complaints except swelling in his right leg Deterding, Llana Aliment, MD  HPI: This is a 79 y.o. male who presents today for f/u of right CEA in 10/10, Aortobifemoral bypass grafting 1995, and right femoral to above knee popliteal artery bypass graft in 2003.  He states that he has been doing well since his last visit.  He denies claudication or any non healing ulcers to his feet.  He does have HTN for which he takes a beta blocker for.  He also has hyperlipidemia, which he takes a statin.  He is also on a baby aspirin.  Past Medical History  Diagnosis Date  . Hypertension   . Myocardial infarction 2009  . CHF (congestive heart failure)   . Hyperlipidemia   . Carotid artery occlusion   . Ulcer   . Cancer     kidney  . Peripheral vascular disease   . COPD (chronic obstructive pulmonary disease)   . Coronary artery disease   . DVT (deep venous thrombosis)    Past Surgical History  Procedure Laterality Date  . Femoral-popliteal bypass graft    . Carotid endarterectomy  07/17/2009    right  . Pr vein bypass graft,aorto-fem-pop    . Aortic arch repair    . Kidney surgery      resection of right renal mass    Allergies  Allergen Reactions  . Penicillins Swelling and Rash    Current Outpatient Prescriptions  Medication Sig Dispense Refill  . aspirin EC 81 MG tablet Take 81 mg by mouth daily.        . carvedilol (COREG) 6.25 MG tablet Take 6.25 mg by mouth 2 (two) times daily with a meal.        . dicyclomine (BENTYL) 10 MG capsule Take 10 mg by mouth 4 (four) times daily -  before meals and at bedtime.      Marland Kitchen doxazosin (CARDURA) 4 MG tablet Take 4 mg by mouth at bedtime.        . Multiple Vitamin (MULTIVITAMIN) capsule Take 1 capsule by mouth daily.        Marland Kitchen NIFEdipine (PROCARDIA XL/ADALAT-CC) 60 MG 24 hr tablet Take 60 mg by mouth 2 (two)  times daily.        . nitroGLYCERIN (NITROSTAT) 0.4 MG SL tablet Place 0.4 mg under the tongue every 5 (five) minutes as needed.        Marland Kitchen omeprazole (PRILOSEC) 20 MG capsule Take 20 mg by mouth daily.        . simvastatin (ZOCOR) 20 MG tablet Take 20 mg by mouth at bedtime.         No current facility-administered medications for this visit.    Family History  Problem Relation Age of Onset  . Hypertension Other   . Coronary artery disease Other   . Alzheimer's disease Other   . Other Other     AAA  . Arthritis Other   . Diabetes Other   . Seizures Other   . Heart disease Father     Aneurysm, Abdominal Aortic    History   Social History  . Marital Status: Married    Spouse Name: N/A    Number of Children: N/A  . Years of Education: N/A   Occupational History  . Not on file.   Social History Main Topics  .  Smoking status: Former Smoker -- 1.00 packs/day for 40 years    Types: Cigarettes    Quit date: 10/11/1999  . Smokeless tobacco: Never Used  . Alcohol Use: No  . Drug Use: No  . Sexually Active: Not on file   Other Topics Concern  . Not on file   Social History Narrative  . No narrative on file     ROS: [x]  Positive   [ ]  Negative   [x ] All sytems reviewed and are negative  Cardiovascular: []  chest pain/pressure []  palpitations []  SOB lying flat []  DOE []  pain in legs while walking []  pain in feet when lying flat []  hx of DVT []  hx of phlebitis []  swelling in legs []  varicose veins  Pulmonary: []  productive cough []  asthma []  wheezing  Neurologic: []  weakness in []  arms []  legs []  numbness in []  arms []  legs [] difficulty speaking or slurred speech []  temporary loss of vision in one eye []  dizziness  Hematologic: []  bleeding problems []  problems with blood clotting easily  GI []  vomiting blood []  blood in stool  GU: []  burning with urination []  blood in urine  Psychiatric: []  hx of major depression  Integumentary: []  rashes  []  ulcers  Constitutional: []  fever []  chills   PHYSICAL EXAMINATION:  Filed Vitals:   04/08/13 1456  BP: 120/60  Pulse:    Body mass index is 25.91 kg/(m^2).  General:  WDWN in NAD Gait: Normal HENT: WNL Eyes: PERRL Pulmonary: normal non-labored breathing , without Rales, rhonchi,  wheezing Cardiac: RRR, without  Murmurs, rubs or gallops Abdomen: soft, NT, no masses; small incisional hernia proximal to the umbilicus  Skin: no rashes, ulcers noted Vascular Exam/Pulses: 1+ edema RLE;  Extremities: without ischemic changes, no Gangrene , no cellulitis; no open wounds;  Musculoskeletal: no muscle wasting or atrophy  Neurologic: A&O X 3; Appropriate Affect ; SENSATION: normal; MOTOR FUNCTION:  moving all extremities equally. Speech is fluent/normal   Non-Invasive Vascular Imaging:  04/08/13  Carotid duplex: 1.  Patent right CEA site with no RICA stenosis 2.  Evidence suggestive of < 40% stenosis of the left proximal internal carotid artery  *no significant change in bilateral carotid arteries when compared to the previous exam on 10/01/12. *no significant stenosis in the bliateral external or common carotid arteries.  Lower extremity Arterial duplex:  -Patent right lower extremity bypass graft with doppler velocities suggestive of a greater than 50% stenosis of the distal outflow artery near the anastomosis.  This increase in velocity may be due to a  Change in vessel diameter.  -patent right distal limb of the aortofemoral bypass graft  -No significant change noted in the right lower extremity bypass graft when compared to the previous exam on 12/31/12.  ABI's: Today: Right:  0.91 Left:  0.71  12/31/12 Right:  0.97 Left:  0.67   ASSESSMENT/PLAN: 79 y.o. male who is s/p right CEA 2010, aortobifemoral bypass 1995, and right femoral to above knee popliteal bypass in 2003.  -pt is doing well without complaints. -the lesion in his right popliteal artery is stable from  last visit.  Will have him f/u in 6 months with f/u lower extremity arterial duplex. -he is doing well from his right CEA and is asymptomatic.  Will have him repeat the carotid duplex in 1 year. -he does have a small incisional hernia-pt knows to go to the ER if he experiences significant pain with this-he has been asymptomatic with this.   Doreatha Massed,  PA-C [facility] [phone]  Clinic MD:  Pt seen and examined in conjunction with Dr. Myra Gianotti  The patient has been seen and examined. We are following him for his carotid occlusive disease. He is status post carotid endarterectomy on the right. Repeat ultrasound showed a patent endarterectomy site. He is less than 40% stenosis of the left carotid artery. He is also status post aortobifemoral bypass graft. He does have an incisional hernia which is reducible. He is status post right femoral-popliteal bypass graft. I have been following him for stenosis within the native artery distal to the distal anastomosis. This appears to be stable as are his ankle-brachial indices. I will have his ultrasound repeated in 6 months to evaluate the stenosis in his outflow artery for his right leg bypass graft. He will have followup carotid ultrasound in one year.  Durene Cal

## 2013-04-09 NOTE — Addendum Note (Signed)
Addended by: Sharee Pimple on: 04/09/2013 07:46 AM   Modules accepted: Orders

## 2013-10-02 ENCOUNTER — Encounter: Payer: Self-pay | Admitting: Family

## 2013-10-07 ENCOUNTER — Ambulatory Visit (INDEPENDENT_AMBULATORY_CARE_PROVIDER_SITE_OTHER)
Admission: RE | Admit: 2013-10-07 | Discharge: 2013-10-07 | Disposition: A | Discharge status: Home / Self Care | Payer: Medicare Other | Source: Ambulatory Visit | Attending: Family | Admitting: Family

## 2013-10-07 ENCOUNTER — Ambulatory Visit (INDEPENDENT_AMBULATORY_CARE_PROVIDER_SITE_OTHER): Payer: Medicare Other | Admitting: Family

## 2013-10-07 ENCOUNTER — Ambulatory Visit (HOSPITAL_COMMUNITY)
Admission: RE | Admit: 2013-10-07 | Discharge: 2013-10-07 | Disposition: A | Discharge status: Home / Self Care | Payer: Medicare Other | Source: Ambulatory Visit | Attending: Family | Admitting: Family

## 2013-10-07 ENCOUNTER — Encounter (HOSPITAL_COMMUNITY): Payer: Medicare Other

## 2013-10-07 ENCOUNTER — Encounter: Payer: Self-pay | Admitting: Family

## 2013-10-07 ENCOUNTER — Other Ambulatory Visit: Payer: Medicare Other

## 2013-10-07 ENCOUNTER — Ambulatory Visit: Payer: Medicare Other | Admitting: Neurosurgery

## 2013-10-07 VITALS — BP 141/72 | HR 57 | Resp 16 | Ht 67.0 in | Wt 166.0 lb

## 2013-10-07 DIAGNOSIS — Z48812 Encounter for surgical aftercare following surgery on the circulatory system: Secondary | ICD-10-CM

## 2013-10-07 DIAGNOSIS — I6529 Occlusion and stenosis of unspecified carotid artery: Secondary | ICD-10-CM

## 2013-10-07 DIAGNOSIS — I739 Peripheral vascular disease, unspecified: Secondary | ICD-10-CM

## 2013-10-07 DIAGNOSIS — I10 Essential (primary) hypertension: Secondary | ICD-10-CM | POA: Insufficient documentation

## 2013-10-07 DIAGNOSIS — I70209 Unspecified atherosclerosis of native arteries of extremities, unspecified extremity: Secondary | ICD-10-CM | POA: Insufficient documentation

## 2013-10-07 DIAGNOSIS — Z87891 Personal history of nicotine dependence: Secondary | ICD-10-CM | POA: Insufficient documentation

## 2013-10-07 DIAGNOSIS — E785 Hyperlipidemia, unspecified: Secondary | ICD-10-CM | POA: Insufficient documentation

## 2013-10-07 NOTE — Progress Notes (Signed)
[facility] HISTORY AND PHYSICAL   MRN : [id number]  History of Present Illness:   Tyler [name] is a 79 y.o. male patient of Dr. Myra Gianotti who is s/p right CEA in 07/17/09 by Dr. Myra Gianotti, Aortobifemoral bypass grafting and bilateral renal artery bypass graft in1995, and right femoral to above knee popliteal artery bypass using vein graft in 1995 and 2003 by Dr. Madilyn Fireman. He returns today for LE arterial surveillance. Bilateral buttocks tired feeling after walking several hundred yards, relieved by rest, he denies claudication symptoms in thighs or calves. Patient denies non-healing wounds. He denies any history of stroke or TIA symptoms.  Pt Diabetic: No Pt smoker: former smoker, quit 15 years ago  Current Outpatient Prescriptions  Medication Sig Dispense Refill  . aspirin EC 81 MG tablet Take 81 mg by mouth daily.        . carvedilol (COREG) 6.25 MG tablet Take 6.25 mg by mouth 2 (two) times daily with a meal.        . dicyclomine (BENTYL) 10 MG capsule Take 10 mg by mouth 4 (four) times daily -  before meals and at bedtime.      Marland Kitchen doxazosin (CARDURA) 4 MG tablet Take 4 mg by mouth at bedtime.        . Multiple Vitamin (MULTIVITAMIN) capsule Take 1 capsule by mouth daily.        Marland Kitchen NIFEdipine (PROCARDIA XL/ADALAT-CC) 60 MG 24 hr tablet Take 60 mg by mouth 2 (two) times daily.        . nitroGLYCERIN (NITROSTAT) 0.4 MG SL tablet Place 0.4 mg under the tongue every 5 (five) minutes as needed.        Marland Kitchen omeprazole (PRILOSEC) 20 MG capsule Take 20 mg by mouth daily.        . simvastatin (ZOCOR) 20 MG tablet Take 20 mg by mouth at bedtime.         No current facility-administered medications for this visit.    Pt meds include: Statin :Yes Betablocker: Yes ASA: Yes Other anticoagulants/antiplatelets: no  Past Medical History  Diagnosis Date  . Hypertension   . Myocardial infarction 2009  . CHF (congestive heart failure)   . Hyperlipidemia   . Carotid  artery occlusion   . Ulcer   . Cancer     kidney  . Peripheral vascular disease   . COPD (chronic obstructive pulmonary disease)   . Coronary artery disease   . DVT (deep venous thrombosis)     Past Surgical History  Procedure Laterality Date  . Femoral-popliteal bypass graft    . Carotid endarterectomy  07/17/2009    right  . Pr vein bypass graft,aorto-fem-pop    . Aortic arch repair    . Kidney surgery      resection of right renal mass    Social History History  Substance Use Topics  . Smoking status: Former Smoker -- 1.00 packs/day for 40 years    Types: Cigarettes    Quit date: 10/11/1999  . Smokeless tobacco: Never Used  . Alcohol Use: No    Family History Family History  Problem Relation Age of Onset  . Hypertension Other   . Coronary artery disease Other   . Alzheimer's disease Other   . Other Other     AAA  . Arthritis Other   . Diabetes Other   . Seizures Other   . Heart disease Father     Aneurysm, Abdominal Aortic    Allergies  Allergen Reactions  . Penicillins Swelling and Rash     REVIEW OF SYSTEMS: See HPI for pertinent positives and negatives.  Physical Examination Filed Vitals:   10/07/13 1520  BP: 141/72  Pulse: 57  Resp: 16  Height: 5\' 7"  (1.702 m)  Weight: 166 lb (75.297 kg)  SpO2: 99%   Body mass index is 25.99 kg/(m^2).  General:  WDWN in NAD Gait: Normal HENT: WNL Eyes: Pupils equal Pulmonary: normal non-labored breathing , without Rales, rhonchi,  wheezing Cardiac: RRR, without  Murmurs, rubs or gallops; No carotid bruits Abdomen: soft, NT, large asymptotic ventral hernia Skin: no rashes, ulcers noted;  no Gangrene , no cellulitis; no open wounds.  Vascular Exam/Pulses: VASCULAR EXAM  Carotid Bruits Left Right   Negative Negative                             VASCULAR EXAM: Extremities without ischemic changes  without Gangrene; without open wounds.                                                                                                           LE Pulses LEFT RIGHT       FEMORAL   palpable   palpable        POPLITEAL  not palpable   not palpable       POSTERIOR TIBIAL  not palpable    palpable        DORSALIS PEDIS      ANTERIOR TIBIAL  palpable   palpable      Musculoskeletal: no muscle wasting or atrophy; no edema  Neurologic: A&O X 3; Appropriate Affect ;  SENSATION: normal; MOTOR FUNCTION: 5/5 Symmetric, CN 2-12 intact Speech is fluent/normal   Non-Invasive Vascular Imaging (10/07/2013):   ABI's: Right: 0.98, biphasic waveform, Left: 0.65, monophasic waveform Previous (04/08/13) ABI's: Right: 0.91, Left: 0.71  RLE Duplex: Patent right distal limb of aorto-bi-femoral BPG. Elevated velocities present involving the right distal femoral-popliteal artery bypass graft anastomosis suggestive of >70% stenosis.  ASSESSMENT:  Tyler [name] is a 79 y.o. male patient who is s/p right CEA in 07/17/09, Aortobifemoral bypass grafting 1995, and right femoral to above knee popliteal artery bypass using vein graft in 2003. His ankle-brachial indices are stable compared to six months ago: normal in the RLE, moderate arterial occlusive disease in the LLE. He has elevated velocities present involving the right distal femoral-popliteal artery bypass graft anastomosis suggestive of >70% stenosis, may be in the 50-70% range; discussed with sonographer, lead sonographer, and Dr. Myra Gianotti. The patient does not have claudication symptoms in his thighs or calves. He does have claudication in both buttocks after walking several hundred yards.   PLAN:   Based on today's exam and non-invasive vascular lab studies, and after discussing with Dr. Myra Gianotti, patient advised to return in 3 months for aortoiliac duplex complete and RLE arterial Duplex, he should already be scheduled for carotid Duplex in 6 months. I discussed in depth with the patient the nature of  atherosclerosis, and emphasized the  importance of maximal medical management including strict control of blood pressure, blood glucose, and lipid levels, obtaining regular exercise, and continued cessation of smoking.  The patient is aware that without maximal medical management the underlying atherosclerotic disease process will progress, limiting the benefit of any interventions.  The patient was given information about stroke prevention and what symptoms should prompt the patient to seek immediate medical care.  The patient was given information about PAD and stroke prevention, including signs, symptoms, treatment, what symptoms should prompt the patient to seek immediate medical care, and risk reduction measures to take.  Charisse March, RN, MSN, FNP-C [facility] Office: [phone]  Clinic MD: Myra Gianotti 10/07/2013 3:58 PM

## 2013-10-07 NOTE — Patient Instructions (Signed)
Stroke Prevention Some medical conditions and behaviors are associated with an increased chance of having a stroke. You may prevent a stroke by making healthy choices and managing medical conditions. Reduce your risk of having a stroke by:  Staying physically active. Get at least 30 minutes of activity on most or all days.  Not smoking. It may also be helpful to avoid exposure to secondhand smoke.  Limiting alcohol use. Moderate alcohol use is considered to be:  No more than 2 drinks per day for men.  No more than 1 drink per day for nonpregnant women.  Eating healthy foods.  Include 5 or more servings of fruits and vegetables a day.  Certain diets may be prescribed to address high blood pressure, high cholesterol, diabetes, or obesity.  Managing your cholesterol levels.  A low-saturated fat, low-trans fat, low-cholesterol, and high-fiber diet may control cholesterol levels.  Take any prescribed medicines to control cholesterol as directed by your caregiver.  Managing your diabetes.  A controlled-carbohydrate, controlled-sugar diet is recommended to manage diabetes.  Take any prescribed medicines to control diabetes as directed by your caregiver.  Controlling your high blood pressure (hypertension).  A low-salt (sodium), low-saturated fat, low-trans fat, and low-cholesterol diet is recommended to manage high blood pressure.  Take any prescribed medicines to control hypertension as directed by your caregiver.  Maintaining a healthy weight.  A reduced-calorie, low-sodium, low-saturated fat, low-trans fat, low-cholesterol diet is recommended to manage weight.  Stopping drug abuse.  Avoiding birth control pills.  Talk to your caregiver about the risks of taking birth control pills if you are over 35 years old, smoke, get migraines, or have ever had a blood clot.  Getting evaluated for sleep disorders (sleep apnea).  Talk to your caregiver about getting a sleep evaluation  if you snore a lot or have excessive sleepiness.  Taking medicines as directed by your caregiver.  For some people, aspirin or blood thinners (anticoagulants) are helpful in reducing the risk of forming abnormal blood clots that can lead to stroke. If you have the irregular heart rhythm of atrial fibrillation, you should be on a blood thinner unless there is a good reason you cannot take them.  Understand all your medicine instructions. SEEK IMMEDIATE MEDICAL CARE IF:   You have sudden weakness or numbness of the face, arm, or leg, especially on one side of the body.  You have sudden confusion.  You have trouble speaking (aphasia) or understanding.  You have sudden trouble seeing in one or both eyes.  You have sudden trouble walking.  You have dizziness.  You have a loss of balance or coordination.  You have a sudden, severe headache with no known cause.  You have new chest pain or an irregular heartbeat. Any of these symptoms may represent a serious problem that is an emergency. Do not wait to see if the symptoms will go away. Get medical help right away. Call your local emergency services (911 in [place] Do not drive yourself to the hospital. Document Released: 11/03/2004 Document Revised: 12/19/2011 Document Reviewed: 03/29/2013 [employer] Patient Information 2014 [employer].   Peripheral Vascular Disease Peripheral Vascular Disease (PVD), also called Peripheral Arterial Disease (PAD), is a circulation problem caused by cholesterol (atherosclerotic plaque) deposits in the arteries. PVD commonly occurs in the lower extremities (legs) but it can occur in other areas of the body, such as your arms. The cholesterol buildup in the arteries reduces blood flow which can cause pain and other serious problems. The presence   of PVD can place a person at risk for Coronary Artery Disease (CAD).  CAUSES  Causes of PVD can be many. It is usually associated with more than one risk factor such  as:   High Cholesterol.  Smoking.  Diabetes.  Lack of exercise or inactivity.  High blood pressure (hypertension).  Obesity.  Family history. SYMPTOMS   When the lower extremities are affected, patients with PVD may experience:  Leg pain with exertion or physical activity. This is called INTERMITTENT CLAUDICATION. This may present as cramping or numbness with physical activity. The location of the pain is associated with the level of blockage. For example, blockage at the abdominal level (distal abdominal aorta) may result in buttock or hip pain. Lower leg arterial blockage may result in calf pain.  As PVD becomes more severe, pain can develop with less physical activity.  In people with severe PVD, leg pain may occur at rest.  Other PVD signs and symptoms:  Leg numbness or weakness.  Coldness in the affected leg or foot, especially when compared to the other leg.  A change in leg color.  Patients with significant PVD are more prone to ulcers or sores on toes, feet or legs. These may take longer to heal or may reoccur. The ulcers or sores can become infected.  If signs and symptoms of PVD are ignored, gangrene may occur. This can result in the loss of toes or loss of an entire limb.  Not all leg pain is related to PVD. Other medical conditions can cause leg pain such as:  Blood clots (embolism) or Deep Vein Thrombosis.  Inflammation of the blood vessels (vasculitis).  Spinal stenosis. DIAGNOSIS  Diagnosis of PVD can involve several different types of tests. These can include:  Pulse Volume Recording Method (PVR). This test is simple, painless and does not involve the use of X-rays. PVR involves measuring and comparing the blood pressure in the arms and legs. An ABI (Ankle-Brachial Index) is calculated. The normal ratio of blood pressures is 1. As this number becomes smaller, it indicates more severe disease.  < 0.95  indicates significant narrowing in one or more leg  vessels.  <0.8 there will usually be pain in the foot, leg or buttock with exercise.  <0.4 will usually have pain in the legs at rest.  <0.25  usually indicates limb threatening PVD.  Doppler detection of pulses in the legs. This test is painless and checks to see if you have a pulses in your legs/feet.  A dye or contrast material (a substance that highlights the blood vessels so they show up on x-ray) may be given to help your caregiver better see the arteries for the following tests. The dye is eliminated from your body by the kidney's. Your caregiver may order blood work to check your kidney function and other laboratory values before the following tests are performed:  Magnetic Resonance Angiography (MRA). An MRA is a picture study of the blood vessels and arteries. The MRA machine uses a large magnet to produce images of the blood vessels.  Computed Tomography Angiography (CTA). A CTA is a specialized x-ray that looks at how the blood flows in your blood vessels. An IV may be inserted into your arm so contrast dye can be injected.  Angiogram. Is a procedure that uses x-rays to look at your blood vessels. This procedure is minimally invasive, meaning a small incision (cut) is made in your groin. A small tube (catheter) is then inserted into the artery   of your groin. The catheter is guided to the blood vessel or artery your caregiver wants to examine. Contrast dye is injected into the catheter. X-rays are then taken of the blood vessel or artery. After the images are obtained, the catheter is taken out. TREATMENT  Treatment of PVD involves many interventions which may include:  Lifestyle changes:  Quitting smoking.  Exercise.  Following a low fat, low cholesterol diet.  Control of diabetes.  Foot care is very important to the PVD patient. Good foot care can help prevent infection.  Medication:  Cholesterol-lowering medicine.  Blood pressure medicine.  Anti-platelet  drugs.  Certain medicines may reduce symptoms of Intermittent Claudication.  Interventional/Surgical options:  Angioplasty. An Angioplasty is a procedure that inflates a balloon in the blocked artery. This opens the blocked artery to improve blood flow.  Stent Implant. A wire mesh tube (stent) is placed in the artery. The stent expands and stays in place, allowing the artery to remain open.  Peripheral Bypass Surgery. This is a surgical procedure that reroutes the blood around a blocked artery to help improve blood flow. This type of procedure may be performed if Angioplasty or stent implants are not an option. SEEK IMMEDIATE MEDICAL CARE IF:   You develop pain or numbness in your arms or legs.  Your arm or leg turns cold, becomes blue in color.  You develop redness, warmth, swelling and pain in your arms or legs. MAKE SURE YOU:   Understand these instructions.  Will watch your condition.  Will get help right away if you are not doing well or get worse. Document Released: 11/03/2004 Document Revised: 12/19/2011 Document Reviewed: 09/30/2008 [employer] Patient Information 2014 [employer].  

## 2013-10-14 ENCOUNTER — Other Ambulatory Visit: Payer: Medicare Other

## 2013-10-14 ENCOUNTER — Ambulatory Visit: Payer: Medicare Other | Admitting: Neurosurgery

## 2013-11-15 ENCOUNTER — Other Ambulatory Visit: Payer: Self-pay | Admitting: Family

## 2013-11-15 DIAGNOSIS — I739 Peripheral vascular disease, unspecified: Secondary | ICD-10-CM

## 2013-11-15 DIAGNOSIS — Z48812 Encounter for surgical aftercare following surgery on the circulatory system: Secondary | ICD-10-CM

## 2013-11-15 DIAGNOSIS — I701 Atherosclerosis of renal artery: Secondary | ICD-10-CM

## 2014-01-03 ENCOUNTER — Encounter: Payer: Self-pay | Admitting: Family

## 2014-01-06 ENCOUNTER — Other Ambulatory Visit: Payer: Self-pay | Admitting: Surgery

## 2014-01-06 ENCOUNTER — Ambulatory Visit (HOSPITAL_COMMUNITY)
Admission: RE | Admit: 2014-01-06 | Discharge: 2014-01-06 | Disposition: A | Discharge status: Home / Self Care | Payer: Medicare Other | Source: Ambulatory Visit | Attending: Family | Admitting: Family

## 2014-01-06 ENCOUNTER — Ambulatory Visit (INDEPENDENT_AMBULATORY_CARE_PROVIDER_SITE_OTHER)
Admission: RE | Admit: 2014-01-06 | Discharge: 2014-01-06 | Disposition: A | Discharge status: Home / Self Care | Payer: Medicare Other | Source: Ambulatory Visit | Attending: Surgery | Admitting: Surgery

## 2014-01-06 ENCOUNTER — Other Ambulatory Visit (HOSPITAL_COMMUNITY): Payer: Medicare Other

## 2014-01-06 ENCOUNTER — Encounter: Payer: Self-pay | Admitting: Family

## 2014-01-06 ENCOUNTER — Ambulatory Visit (HOSPITAL_COMMUNITY)
Admission: RE | Admit: 2014-01-06 | Discharge: 2014-01-06 | Disposition: A | Discharge status: Home / Self Care | Payer: Medicare Other | Source: Ambulatory Visit | Attending: Surgery | Admitting: Surgery

## 2014-01-06 ENCOUNTER — Ambulatory Visit (INDEPENDENT_AMBULATORY_CARE_PROVIDER_SITE_OTHER): Payer: Medicare Other | Admitting: Family

## 2014-01-06 VITALS — BP 125/64 | HR 53 | Resp 14 | Ht 67.0 in | Wt 165.0 lb

## 2014-01-06 DIAGNOSIS — I739 Peripheral vascular disease, unspecified: Secondary | ICD-10-CM

## 2014-01-06 DIAGNOSIS — I701 Atherosclerosis of renal artery: Secondary | ICD-10-CM

## 2014-01-06 DIAGNOSIS — Z48812 Encounter for surgical aftercare following surgery on the circulatory system: Secondary | ICD-10-CM | POA: Insufficient documentation

## 2014-01-06 NOTE — Patient Instructions (Signed)
Peripheral Vascular Disease Peripheral Vascular Disease (PVD), also called Peripheral Arterial Disease (PAD), is a circulation problem caused by cholesterol (atherosclerotic plaque) deposits in the arteries. PVD commonly occurs in the lower extremities (legs) but it can occur in other areas of the body, such as your arms. The cholesterol buildup in the arteries reduces blood flow which can cause pain and other serious problems. The presence of PVD can place a person at risk for Coronary Artery Disease (CAD).  CAUSES  Causes of PVD can be many. It is usually associated with more than one risk factor such as:   High Cholesterol.  Smoking.  Diabetes.  Lack of exercise or inactivity.  High blood pressure (hypertension).  Obesity.  Family history. SYMPTOMS   When the lower extremities are affected, patients with PVD may experience:  Leg pain with exertion or physical activity. This is called INTERMITTENT CLAUDICATION. This may present as cramping or numbness with physical activity. The location of the pain is associated with the level of blockage. For example, blockage at the abdominal level (distal abdominal aorta) may result in buttock or hip pain. Lower leg arterial blockage may result in calf pain.  As PVD becomes more severe, pain can develop with less physical activity.  In people with severe PVD, leg pain may occur at rest.  Other PVD signs and symptoms:  Leg numbness or weakness.  Coldness in the affected leg or foot, especially when compared to the other leg.  A change in leg color.  Patients with significant PVD are more prone to ulcers or sores on toes, feet or legs. These may take longer to heal or may reoccur. The ulcers or sores can become infected.  If signs and symptoms of PVD are ignored, gangrene may occur. This can result in the loss of toes or loss of an entire limb.  Not all leg pain is related to PVD. Other medical conditions can cause leg pain such  as:  Blood clots (embolism) or Deep Vein Thrombosis.  Inflammation of the blood vessels (vasculitis).  Spinal stenosis. DIAGNOSIS  Diagnosis of PVD can involve several different types of tests. These can include:  Pulse Volume Recording Method (PVR). This test is simple, painless and does not involve the use of X-rays. PVR involves measuring and comparing the blood pressure in the arms and legs. An ABI (Ankle-Brachial Index) is calculated. The normal ratio of blood pressures is 1. As this number becomes smaller, it indicates more severe disease.  < 0.95  indicates significant narrowing in one or more leg vessels.  <0.8 there will usually be pain in the foot, leg or buttock with exercise.  <0.4 will usually have pain in the legs at rest.  <0.25  usually indicates limb threatening PVD.  Doppler detection of pulses in the legs. This test is painless and checks to see if you have a pulses in your legs/feet.  A dye or contrast material (a substance that highlights the blood vessels so they show up on x-ray) may be given to help your caregiver better see the arteries for the following tests. The dye is eliminated from your body by the kidney's. Your caregiver may order blood work to check your kidney function and other laboratory values before the following tests are performed:  Magnetic Resonance Angiography (MRA). An MRA is a picture study of the blood vessels and arteries. The MRA machine uses a large magnet to produce images of the blood vessels.  Computed Tomography Angiography (CTA). A CTA is a   specialized x-ray that looks at how the blood flows in your blood vessels. An IV may be inserted into your arm so contrast dye can be injected.  Angiogram. Is a procedure that uses x-rays to look at your blood vessels. This procedure is minimally invasive, meaning a small incision (cut) is made in your groin. A small tube (catheter) is then inserted into the artery of your groin. The catheter is  guided to the blood vessel or artery your caregiver wants to examine. Contrast dye is injected into the catheter. X-rays are then taken of the blood vessel or artery. After the images are obtained, the catheter is taken out. TREATMENT  Treatment of PVD involves many interventions which may include:  Lifestyle changes:  Quitting smoking.  Exercise.  Following a low fat, low cholesterol diet.  Control of diabetes.  Foot care is very important to the PVD patient. Good foot care can help prevent infection.  Medication:  Cholesterol-lowering medicine.  Blood pressure medicine.  Anti-platelet drugs.  Certain medicines may reduce symptoms of Intermittent Claudication.  Interventional/Surgical options:  Angioplasty. An Angioplasty is a procedure that inflates a balloon in the blocked artery. This opens the blocked artery to improve blood flow.  Stent Implant. A wire mesh tube (stent) is placed in the artery. The stent expands and stays in place, allowing the artery to remain open.  Peripheral Bypass Surgery. This is a surgical procedure that reroutes the blood around a blocked artery to help improve blood flow. This type of procedure may be performed if Angioplasty or stent implants are not an option. SEEK IMMEDIATE MEDICAL CARE IF:   You develop pain or numbness in your arms or legs.  Your arm or leg turns cold, becomes blue in color.  You develop redness, warmth, swelling and pain in your arms or legs. MAKE SURE YOU:   Understand these instructions.  Will watch your condition.  Will get help right away if you are not doing well or get worse. Document Released: 11/03/2004 Document Revised: 12/19/2011 Document Reviewed: 09/30/2008 [employer] Patient Information 2014 [employer].   Stroke Prevention Some medical conditions and behaviors are associated with an increased chance of having a stroke. You may prevent a stroke by making healthy choices and managing medical  conditions. HOW CAN I REDUCE MY RISK OF HAVING A STROKE?   Stay physically active. Get at least 30 minutes of activity on most or all days.  Do not smoke. It may also be helpful to avoid exposure to secondhand smoke.  Limit alcohol use. Moderate alcohol use is considered to be:  No more than 2 drinks per day for men.  No more than 1 drink per day for nonpregnant women.  Eat healthy foods. This involves  Eating 5 or more servings of fruits and vegetables a day.  Following a diet that addresses high blood pressure (hypertension), high cholesterol, diabetes, or obesity.  Manage your cholesterol levels.  A diet low in saturated fat, trans fat, and cholesterol and high in fiber may control cholesterol levels.  Take any prescribed medicines to control cholesterol as directed by your health care provider.  Manage your diabetes.  A controlled-carbohydrate, controlled-sugar diet is recommended to manage diabetes.  Take any prescribed medicines to control diabetes as directed by your health care provider.  Control your hypertension.  A low-salt (sodium), low-saturated fat, low-trans fat, and low-cholesterol diet is recommended to manage hypertension.  Take any prescribed medicines to control hypertension as directed by your health care provider.    Maintain a healthy weight.  A reduced-calorie, low-sodium, low-saturated fat, low-trans fat, low-cholesterol diet is recommended to manage weight.  Stop drug abuse.  Avoid taking birth control pills.  Talk to your health care provider about the risks of taking birth control pills if you are over 35 years old, smoke, get migraines, or have ever had a blood clot.  Get evaluated for sleep disorders (sleep apnea).  Talk to your health care provider about getting a sleep evaluation if you snore a lot or have excessive sleepiness.  Take medicines as directed by your health care provider.  For some people, aspirin or blood thinners  (anticoagulants) are helpful in reducing the risk of forming abnormal blood clots that can lead to stroke. If you have the irregular heart rhythm of atrial fibrillation, you should be on a blood thinner unless there is a good reason you cannot take them.  Understand all your medicine instructions.  Make sure that other other conditions (such as anemia or atherosclerosis) are addressed. SEEK IMMEDIATE MEDICAL CARE IF:   You have sudden weakness or numbness of the face, arm, or leg, especially on one side of the body.  Your face or eyelid droops to one side.  You have sudden confusion.  You have trouble speaking (aphasia) or understanding.  You have sudden trouble seeing in one or both eyes.  You have sudden trouble walking.  You have dizziness.  You have a loss of balance or coordination.  You have a sudden, severe headache with no known cause.  You have new chest pain or an irregular heartbeat. Any of these symptoms may represent a serious problem that is an emergency. Do not wait to see if the symptoms will go away. Get medical help at once. Call your local emergency services  (911 in [place] Do not drive yourself to the hospital. Document Released: 11/03/2004 Document Revised: 07/17/2013 Document Reviewed: 03/29/2013 [employer] Patient Information 2014 [employer].  

## 2014-01-06 NOTE — Progress Notes (Signed)
[facility] -PAD  History of Present Illness Tyler [name] is a 80 y.o. male patient of Dr. Trula Slade who is s/p right CEA in 07/17/09 by Dr. Trula Slade, Aortobifemoral bypass grafting and bilateral renal artery bypass graft in1995, and right femoral to above knee popliteal artery bypass using vein graft in 1995 and 2003 by Dr. Amedeo Plenty.  He returns today for LE arterial surveillance.  Bilateral buttocks tired feeling after walking about a hundred yards, relieved by rest, he denies claudication symptoms in thighs or calves.  Patient denies non-healing wounds.  He denies any history of stroke or TIA symptoms.   Pt Diabetic: No  Pt smoker: former smoker, quit 15 years ago  Pt meds include:  Statin :Yes  Betablocker: Yes  ASA: Yes  Other anticoagulants/antiplatelets: no   Past Medical History  Diagnosis Date  . Hypertension   . Myocardial infarction 2009  . CHF (congestive heart failure)   . Hyperlipidemia   . Carotid artery occlusion   . Ulcer   . Cancer     kidney  . Peripheral vascular disease   . COPD (chronic obstructive pulmonary disease)   . Coronary artery disease   . DVT (deep venous thrombosis)     Social History History  Substance Use Topics  . Smoking status: Former Smoker -- 1.00 packs/day for 40 years    Types: Cigarettes    Quit date: 10/11/1999  . Smokeless tobacco: Never Used  . Alcohol Use: No    Family History Family History  Problem Relation Age of Onset  . Hypertension Other   . Coronary artery disease Other   . Alzheimer's disease Other   . Other Other     AAA  . Arthritis Other   . Diabetes Other   . Seizures Other   . Heart disease Father     Aneurysm, Abdominal Aortic    Past Surgical History  Procedure Laterality Date  . Femoral-popliteal bypass graft    . Carotid endarterectomy  07/17/2009    right  . Pr vein bypass graft,aorto-fem-pop    . Aortic arch repair    . Kidney surgery     resection of right renal mass    Allergies  Allergen Reactions  . Penicillins Swelling and Rash    Current Outpatient Prescriptions  Medication Sig Dispense Refill  . aspirin EC 81 MG tablet Take 81 mg by mouth daily.        . carvedilol (COREG) 6.25 MG tablet Take 6.25 mg by mouth 2 (two) times daily with a meal.        . dicyclomine (BENTYL) 10 MG capsule Take 10 mg by mouth 4 (four) times daily -  before meals and at bedtime.      Marland Kitchen doxazosin (CARDURA) 4 MG tablet Take 4 mg by mouth at bedtime.        . Multiple Vitamin (MULTIVITAMIN) capsule Take 1 capsule by mouth daily.        Marland Kitchen NIFEdipine (PROCARDIA XL/ADALAT-CC) 60 MG 24 hr tablet Take 60 mg by mouth 2 (two) times daily.        . nitroGLYCERIN (NITROSTAT) 0.4 MG SL tablet Place 0.4 mg under the tongue every 5 (five) minutes as needed.        Marland Kitchen omeprazole (PRILOSEC) 20 MG capsule Take 20 mg by mouth daily.        . simvastatin (ZOCOR) 20 MG tablet Take 20 mg by mouth at bedtime.  No current facility-administered medications for this visit.    ROS: See HPI for pertinent positives and negatives.   Physical Examination  Filed Vitals:   01/06/14 1103  BP: 125/64  Pulse: 53  Resp: 14   Filed Weights   01/06/14 1103  Weight: 165 lb (74.844 kg)   Body mass index is 25.84 kg/(m^2).   General: A&O x 3, WDWN. Gait: normal Eyes: PERRLA. Pulmonary: Breathing is non labored. Cardiac: regular Rythm , without detected murmur.      Aorta is not palpable. Radial pulses: 2+ palpable and =.                           VASCULAR EXAM: Extremities without ischemic changes  without Gangrene; without open wounds.                                                                                                          LE Pulses LEFT RIGHT       FEMORAL  not palpable   palpable        POPLITEAL  not palpable   not palpable       POSTERIOR TIBIAL  not palpable    palpable        DORSALIS PEDIS      ANTERIOR TIBIAL   palpable  palpable    Abdomen: soft, NT, no masses. Skin: no rashes, no ulcers noted. Musculoskeletal: no muscle wasting or atrophy.  Neurologic: A&O X 3; Appropriate Affect ; SENSATION: normal; MOTOR FUNCTION:  moving all extremities equally. Speech is fluent/normal. CN 2-12 grossly intact.    Non-Invasive Vascular Imaging: DATE: 01/06/2014 [facility] - ILIAC DUPLEX EVALUATION    INDICATION: PVD    PREVIOUS INTERVENTION(S): Aorta bifemoral bypass graft with bilateral aorta renal bypass graft 1995, right femoral to below knee popliteal bypass graft by Dr. Amedeo Plenty in 1995. In 2003 Dr. Allean Found performed a right femoral to above knee popliteal bypass with saphenous vein.    DUPLEX EXAM: Aorta-iliac duplex limited     Peak Systolic Velocity (cm/s)  AORTA - Proximal -  AORTA - Mid -  AORTA - Distal -    RIGHT  LEFT  Peak Systolic Velocity (cm/s) Ratio (if abnormal) Waveform  Peak Systolic Velocity (cm/s) Ratio (if abnormal) Waveform  - - - Common Iliac Artery - Proximal -  -  - - - Common Iliac Artery - Mid -  -  - - - Common Iliac Artery - Distal -  -  - - - External Iliac Artery - Proximal -  -  - - - External Iliac Artery - Mid -  -  *247 - B External Iliac Artery - Distal >783  Stenosic  - - - Internal Iliac Artery -  -  0.99 Today's ABI / TBI 0.67  0.98 Previous ABI / TBI ( 10-07-13 ) 0.65    Waveform:    M - Monophasic       B - Biphasic       T - Triphasic  If  Ankle Brachial Index (ABI) or Toe Brachial Index (TBI) performed, please see complete report     ADDITIONAL FINDINGS:   Focal  plaque in the distal right limb of the aorta-bifemoral bypass graft.    IMPRESSION: 1. 50 - 99% stenosis of the bilateral limb of the bypass graft, left greater than right. 2. Unable to visualize renal bypass graft..    Compared to the previous exam:  None   LOWER EXTREMITY ARTERIAL DUPLEX EVALUATION    INDICATION: PVD    PREVIOUS INTERVENTION(S): Aorta bifemoral bypass graft with bilateral  aorta renal bypass graft, right femoral to below knee popliteal bypass graft by Dr. Amedeo Plenty in  1995, In 2003 Dr. Allean Found performed a right femoral to above knee popliteal bypass with saphenous vein.    DUPLEX EXAM: Right lower extremity duplex    RIGHT  LEFT   Peak Systolic Velocity (cm/s) Ratio (if abnormal) Waveform  Peak Systolic Velocity (cm/s) Ratio (if abnormal) Waveform  91 limb  B Inflow Artery     68  B Proximal Anastomosis     40  B Proximal Graft     71  B Mid Graft     117  B  Distal Graft     62  B Distal Anastomosis     307  B Outflow Artery     0.99 Today's ABI / TBI 0.67  0.98 Previous ABI / TBI (  10/07/13) 0.65    Waveform:    M - Monophasic       B - Biphasic       T - Triphasic  If Ankle Brachial Index (ABI) or Toe Brachial Index (TBI) performed, please see complete report     ADDITIONAL FINDINGS:     IMPRESSION: 1. Patent right femoral to above knee bypass graft with greater than 50% stenosis in the right distal popliteal artery.    Compared to the previous exam:      ASSESSMENT: Tyler [name] is a 80 y.o. male who is s/p right CEA in 07/17/09 by Dr. Trula Slade, Aortobifemoral bypass grafting and bilateral renal artery bypass graft in1995, and right femoral to above knee popliteal artery bypass using vein graft in 1995 and 2003 by Dr. Amedeo Plenty.  Patent right femoral to above knee bypass graft with greater than 50% stenosis in the right distal popliteal artery. Bilateral buttocks tired feeling after walking about a hundred yards, relieved by rest, he denies claudication symptoms in thighs or calves.  Patient denies non-healing wounds.   Focal  plaque in the distal right limb of the aorta-bifemoral bypass graft. 50 - 99% stenosis of the bilateral limb of the bypass graft, left greater than right. Unable to visualize renal bypass graft.Marland Kitchen  PLAN:  I discussed in depth with the patient the nature of atherosclerosis, and emphasized the importance of maximal medical  management including strict control of blood pressure, blood glucose, and lipid levels, obtaining regular exercise, and continued cessation of smoking.  The patient is aware that without maximal medical management the underlying atherosclerotic disease process will progress, limiting the benefit of any interventions.  Based on the patient's.vascular studies and examination, pt will return to clinic in 3 months for carotid Duplex. ABI's, aortoiliac Duplex, and bilateral LE arterial Duplex., follow up with Dr. Trula Slade.  The patient was given information about PAD including signs, symptoms, treatment, what symptoms should prompt the patient to seek immediate medical care, and risk reduction measures to take.  Vinnie Level Nickel, RN, MSN, FNP-C [facility] Phone: [phone]  Clinic MD: Kellie Simmering  01/06/2014 9:19 AM

## 2014-01-07 ENCOUNTER — Other Ambulatory Visit: Payer: Self-pay | Admitting: Family

## 2014-01-07 DIAGNOSIS — Z48812 Encounter for surgical aftercare following surgery on the circulatory system: Secondary | ICD-10-CM

## 2014-01-07 DIAGNOSIS — I739 Peripheral vascular disease, unspecified: Secondary | ICD-10-CM

## 2014-04-07 ENCOUNTER — Other Ambulatory Visit (HOSPITAL_COMMUNITY): Payer: Medicare Other

## 2014-04-07 ENCOUNTER — Ambulatory Visit: Payer: Medicare Other | Admitting: Family

## 2014-04-08 ENCOUNTER — Encounter: Payer: Self-pay | Admitting: Family

## 2014-04-09 ENCOUNTER — Ambulatory Visit (INDEPENDENT_AMBULATORY_CARE_PROVIDER_SITE_OTHER)
Admission: RE | Admit: 2014-04-09 | Discharge: 2014-04-09 | Disposition: A | Discharge status: Home / Self Care | Payer: Medicare Other | Source: Ambulatory Visit | Attending: Family | Admitting: Family

## 2014-04-09 ENCOUNTER — Ambulatory Visit (INDEPENDENT_AMBULATORY_CARE_PROVIDER_SITE_OTHER): Payer: Medicare Other | Admitting: Family

## 2014-04-09 ENCOUNTER — Ambulatory Visit (HOSPITAL_COMMUNITY)
Admission: RE | Admit: 2014-04-09 | Discharge: 2014-04-09 | Disposition: A | Discharge status: Home / Self Care | Payer: Medicare Other | Source: Ambulatory Visit | Attending: Family | Admitting: Family

## 2014-04-09 ENCOUNTER — Encounter: Payer: Self-pay | Admitting: Family

## 2014-04-09 ENCOUNTER — Other Ambulatory Visit: Payer: Self-pay | Admitting: Family

## 2014-04-09 ENCOUNTER — Other Ambulatory Visit: Payer: Self-pay

## 2014-04-09 VITALS — BP 147/77 | HR 66 | Resp 16 | Ht 67.0 in | Wt 159.0 lb

## 2014-04-09 DIAGNOSIS — I714 Abdominal aortic aneurysm, without rupture, unspecified: Secondary | ICD-10-CM | POA: Insufficient documentation

## 2014-04-09 DIAGNOSIS — I658 Occlusion and stenosis of other precerebral arteries: Secondary | ICD-10-CM | POA: Insufficient documentation

## 2014-04-09 DIAGNOSIS — I739 Peripheral vascular disease, unspecified: Secondary | ICD-10-CM

## 2014-04-09 DIAGNOSIS — I6529 Occlusion and stenosis of unspecified carotid artery: Secondary | ICD-10-CM

## 2014-04-09 DIAGNOSIS — Z48812 Encounter for surgical aftercare following surgery on the circulatory system: Secondary | ICD-10-CM | POA: Insufficient documentation

## 2014-04-09 NOTE — Progress Notes (Signed)
[facility] [place] HISTORY AND PHYSICAL   MRN : [id number]  History of Present Illness:   Tyler [name] is a 80 y.o. male patient of Dr. Trula Slade who is s/p right CEA in 07/17/09 by Dr. Trula Slade, Aortobifemoral bypass grafting and bilateral renal artery bypass graft in1995, and right femoral to above knee popliteal artery bypass using vein graft in 1995 and 2003 by Dr. Amedeo Plenty.  He returns today for LE arterial surveillance.  Bilateral buttocks tired feeling after walking about a hundred yards, relieved by rest, he denies claudication symptoms in thighs or calves.  Patient denies non-healing wounds.  He denies any recent of remote history of stroke or TIA symptoms.   Pt Diabetic: No  Pt smoker: former smoker, quit 15 years ago  Pt meds include:  Statin :Yes  Betablocker: Yes  ASA: Yes  Other anticoagulants/antiplatelets: no    Current Outpatient Prescriptions  Medication Sig Dispense Refill  . aspirin EC 81 MG tablet Take 81 mg by mouth daily.        . carvedilol (COREG) 6.25 MG tablet Take 6.25 mg by mouth 2 (two) times daily with a meal.        . dicyclomine (BENTYL) 10 MG capsule Take 10 mg by mouth 4 (four) times daily -  before meals and at bedtime.      Marland Kitchen doxazosin (CARDURA) 4 MG tablet Take 4 mg by mouth at bedtime.        . Multiple Vitamin (MULTIVITAMIN) capsule Take 1 capsule by mouth daily.        Marland Kitchen NIFEdipine (PROCARDIA XL/ADALAT-CC) 60 MG 24 hr tablet Take 60 mg by mouth 2 (two) times daily.        . nitroGLYCERIN (NITROSTAT) 0.4 MG SL tablet Place 0.4 mg under the tongue every 5 (five) minutes as needed. Pt  Has patch on everyday      . omeprazole (PRILOSEC) 20 MG capsule Take 20 mg by mouth daily.        . simvastatin (ZOCOR) 20 MG tablet Take 20 mg by mouth at bedtime.         No current facility-administered medications for this visit.    Past Medical History  Diagnosis Date  . Hypertension   . Myocardial infarction 2009  . CHF (congestive  heart failure)   . Hyperlipidemia   . Carotid artery occlusion   . Ulcer   . Cancer     kidney  . Peripheral vascular disease   . COPD (chronic obstructive pulmonary disease)   . Coronary artery disease   . DVT (deep venous thrombosis)     Past Surgical History  Procedure Laterality Date  . Femoral-popliteal bypass graft  1995 and 2003  . Carotid endarterectomy  07/17/2009    right  . Pr vein bypass graft,aorto-fem-pop    . Aortic arch repair  1995  . Kidney surgery      resection of right renal mass    Social History History  Substance Use Topics  . Smoking status: Former Smoker -- 1.00 packs/day for 40 years    Types: Cigarettes    Quit date: 10/11/1999  . Smokeless tobacco: Never Used  . Alcohol Use: No    Family History Family History  Problem Relation Age of Onset  . Hypertension Other   . Coronary artery disease Other   . Alzheimer's disease Other   . Other Other     AAA  . Arthritis Other   . Diabetes Other   .  Seizures Other   . Heart disease Father     Aneurysm, Abdominal Aortic    Allergies  Allergen Reactions  . Penicillins Swelling and Rash     REVIEW OF SYSTEMS: See HPI for pertinent positives and negatives.  Physical Examination Filed Vitals:   04/09/14 1125 04/09/14 1127  BP: 150/79 147/77  Pulse: 60 66  Resp:  16  Height:  5\' 7"  (1.702 m)  Weight:  159 lb (72.122 kg)  SpO2:  99%   Body mass index is 24.9 kg/(m^2).  General: A&O x 3, WDWN.  Gait: normal  Eyes: PERRLA.  Pulmonary: Breathing is non labored.  Cardiac: regular Rythm , without detected murmur.   Aorta is not palpable.  Radial pulses: 2+ palpable and =.   VASCULAR EXAM:  Extremities without ischemic changes  without Gangrene; without open wounds.   LE Pulses  LEFT  RIGHT   FEMORAL  not palpable  palpable   POPLITEAL  not palpable  not palpable   POSTERIOR TIBIAL  not palpable  palpable   DORSALIS PEDIS  ANTERIOR TIBIAL  Not palpable  Not palpable    Abdomen: soft, NT, no masses.  Skin: no rashes, no ulcers noted.  Musculoskeletal: no muscle wasting or atrophy.  Neurologic: A&O X 3; Appropriate Affect ; SENSATION: normal; MOTOR FUNCTION: moving all extremities equally. Speech is fluent/normal. CN 2-12  intact.   Non-Invasive Vascular Imaging:  VASCULAR LAB EVALUATION    INDICATION: Follow up aorta to bifemoral bypass graft    PREVIOUS INTERVENTION(S): Aorta bifemoral bypass graft with bilateral aorta to renal bypass graft by Dr. Amedeo Plenty in 1995. Right femoral to below knee popliteal bypass graft by Dr. Amedeo Plenty in 1995. Dr. Allean Found performed a right femoral to above knee popliteal bypass with saphenous vein 2003.    DUPLEX EXAM: Aorta bifemoral bypass graft    FINDINGS:     IMPRESSION:  1. Patent right limb of the aorta to femoral bypass graft, unable to obtain increased velocity as shown on prior exam. 2. 50 - 99% stenosis of the left limb of the bypass graft. 3. See attached.   LOWER EXTREMITY ARTERIAL DUPLEX EVALUATION    INDICATION: Follow up bypass graft    PREVIOUS INTERVENTION(S): Aorta bifemoral bypass graft with bilateral aorta to renal bypass graft by Dr. Amedeo Plenty in 1995. Right femoral to below knee popliteal bypass graft by Dr. Amedeo Plenty in 1995. Dr. Allean Found performed a right femoral to above knee popliteal bypass with saphenous vein 2003.    DUPLEX EXAM: Right lower extremity duplex    RIGHT  LEFT   Peak Systolic Velocity (cm/s) Ratio (if abnormal) Waveform  Peak Systolic Velocity (cm/s) Ratio (if abnormal) Waveform  136  T Inflow Artery     85  B Proximal Anastomosis     37  B Proximal Graft     55  B Mid Graft     83  B  Distal Graft     39  B Distal Anastomosis     288  B Outflow Artery     1.02 Today's ABI / TBI .65  .99 Previous ABI / TBI ( 01/06/14 ) .67    Waveform:    M - Monophasic       B - Biphasic       T - Triphasic  If Ankle Brachial Index (ABI) or Toe Brachial Index (TBI) performed, please see complete report      ADDITIONAL FINDINGS:     IMPRESSION: 1. Patent right  femoral to below knee popliteal bypass graft with greater than 50% stenosis in the outflow artery.    Compared to the previous exam:  No change   CEREBROVASCULAR DUPLEX EVALUATION    INDICATION: Carotid artery disease    PREVIOUS INTERVENTION(S): Aorta bifemoral bypass graft with bilateral aorta to renal bypass graft by Dr. Amedeo Plenty in 1995. Right femoral to below knee popliteal bypass graft by Dr. Amedeo Plenty in 1995.  Dr. Allean Found performed a right femoral to above knee popliteal bypass graft with saphenous vein. 2003. Right carotid endarterectomy 07/17/2009.    DUPLEX EXAM: Carotid duplex    RIGHT  LEFT  Peak Systolic Velocities (cm/s) End Diastolic Velocities (cm/s) Plaque LOCATION Peak Systolic Velocities (cm/s) End Diastolic Velocities (cm/s) Plaque  104 11  CCA PROXIMAL 101 11 HT  91 13  CCA MID 82 16 -  70 11 HM CCA DISTAL 104 20 -  96 4  ECA 144 11 -  72 14  ICA PROXIMAL 112 27 CP  70 18  ICA MID 119 23 -  79 25  ICA DISTAL 72 21 -    N/A ICA / CCA Ratio (PSV) 1.4  Antegrade Vertebral Flow Antegrade  016 Brachial Systolic Pressure (mmHg) 010  Triphasic Brachial Artery Waveforms Triphasic    Plaque Morphology:  HM = Homogeneous, HT = Heterogeneous, CP = Calcific Plaque, SP = Smooth Plaque, IP = Irregular Plaque     ADDITIONAL FINDINGS:     IMPRESSION: 1. Patent right carotid endarterectomy with no evidence for restenosis. 2. Less than 40% left internal carotid artery stenosis.    Compared to the previous exam:  No change.    ASSESSMENT:  Tyler [name] is a 80 y.o. male s/p right CEA in 07/17/09 by Dr. Trula Slade, Aortobifemoral bypass grafting and bilateral renal artery bypass graft in1995, and right femoral to above knee popliteal artery bypass using vein graft in 1995 and 2003 by Dr. Amedeo Plenty.   He has not history of stroke or TIA. Patent right carotid endarterectomy with no evidence for restenosis. Less than 40% left  internal carotid artery stenosis.  Bilateral buttocks tired feeling after walking about a hundred yards, relieved by rest, he denies claudication symptoms in thighs or calves.  Patient denies non-healing wounds. Patent right limb of the aorta to femoral bypass graft, unable to obtain increased velocity as shown on prior exam. 50 - 99% stenosis of the left limb of the bypass graft including velocities of 723 and 698 cm/sec.  Right ABI of 1.02 with triphasic waveforms in PT and DP arteries, left ABI at 0.67 with monophasic waveforms; remains stable compared to three months ago.   PLAN:   Based on today's exam and non-invasive vascular lab results, and after discussing with Dr. Scot Dock,  the patient will follow up for aortogram with run off with Dr. Trula Slade. I discussed in depth with the patient the nature of atherosclerosis, and emphasized the importance of maximal medical management including strict control of blood pressure, blood glucose, and lipid levels, obtaining regular exercise, and cessation of smoking.  The patient is aware that without maximal medical management the underlying atherosclerotic disease process will progress, limiting the benefit of any interventions.  The patient was given information about stroke prevention and what symptoms should prompt the patient to seek immediate medical care.  The patient was given information about PAD including signs, symptoms, treatment, what symptoms should prompt the patient to seek immediate medical care, and risk reduction measures to take. Thank you for  allowing [place] to participate in this patient's care.  Clemon Chambers, RN, MSN, FNP-C [facility] Office: [phone]  Clinic MD: Scot Dock 04/09/2014 11:40 AM

## 2014-04-11 ENCOUNTER — Encounter (HOSPITAL_COMMUNITY): Payer: Self-pay | Admitting: Pharmacy Technician

## 2014-04-15 ENCOUNTER — Encounter (HOSPITAL_COMMUNITY)
Admission: RE | Disposition: A | Discharge status: Home / Self Care | Payer: Self-pay | Source: Ambulatory Visit | Attending: Surgery

## 2014-04-15 ENCOUNTER — Ambulatory Visit (HOSPITAL_COMMUNITY)
Admission: RE | Admit: 2014-04-15 | Discharge: 2014-04-15 | Disposition: A | Discharge status: Home / Self Care | Payer: Medicare Other | Source: Ambulatory Visit | Attending: Surgery | Admitting: Surgery

## 2014-04-15 ENCOUNTER — Telehealth: Payer: Self-pay | Admitting: Surgery

## 2014-04-15 DIAGNOSIS — Z7982 Long term (current) use of aspirin: Secondary | ICD-10-CM | POA: Insufficient documentation

## 2014-04-15 DIAGNOSIS — I10 Essential (primary) hypertension: Secondary | ICD-10-CM | POA: Diagnosis not present

## 2014-04-15 DIAGNOSIS — I252 Old myocardial infarction: Secondary | ICD-10-CM | POA: Insufficient documentation

## 2014-04-15 DIAGNOSIS — I509 Heart failure, unspecified: Secondary | ICD-10-CM | POA: Insufficient documentation

## 2014-04-15 DIAGNOSIS — J4489 Other specified chronic obstructive pulmonary disease: Secondary | ICD-10-CM | POA: Insufficient documentation

## 2014-04-15 DIAGNOSIS — J449 Chronic obstructive pulmonary disease, unspecified: Secondary | ICD-10-CM | POA: Insufficient documentation

## 2014-04-15 DIAGNOSIS — T82898A Other specified complication of vascular prosthetic devices, implants and grafts, initial encounter: Secondary | ICD-10-CM

## 2014-04-15 DIAGNOSIS — E785 Hyperlipidemia, unspecified: Secondary | ICD-10-CM | POA: Diagnosis not present

## 2014-04-15 DIAGNOSIS — I70309 Unspecified atherosclerosis of unspecified type of bypass graft(s) of the extremities, unspecified extremity: Secondary | ICD-10-CM | POA: Insufficient documentation

## 2014-04-15 DIAGNOSIS — I6529 Occlusion and stenosis of unspecified carotid artery: Secondary | ICD-10-CM | POA: Insufficient documentation

## 2014-04-15 DIAGNOSIS — Z87891 Personal history of nicotine dependence: Secondary | ICD-10-CM | POA: Insufficient documentation

## 2014-04-15 DIAGNOSIS — I2510 Atherosclerotic heart disease of native coronary artery without angina pectoris: Secondary | ICD-10-CM | POA: Insufficient documentation

## 2014-04-15 DIAGNOSIS — Z86718 Personal history of other venous thrombosis and embolism: Secondary | ICD-10-CM | POA: Insufficient documentation

## 2014-04-15 DIAGNOSIS — Z85528 Personal history of other malignant neoplasm of kidney: Secondary | ICD-10-CM | POA: Insufficient documentation

## 2014-04-15 HISTORY — PX: ABDOMINAL AORTAGRAM: SHX5454

## 2014-04-15 LAB — POCT I-STAT, CHEM 8
BUN: 20 mg/dL (ref 6–23)
Calcium, Ion: 1.25 mmol/L (ref 1.13–1.30)
Chloride: 104 mEq/L (ref 96–112)
Creatinine, Ser: 1.5 mg/dL — ABNORMAL HIGH (ref 0.50–1.35)
GLUCOSE: 114 mg/dL — AB (ref 70–99)
HEMATOCRIT: 47 % (ref 39.0–52.0)
HEMOGLOBIN: 16 g/dL (ref 13.0–17.0)
Potassium: 3.8 mEq/L (ref 3.7–5.3)
Sodium: 144 mEq/L (ref 137–147)
TCO2: 23 mmol/L (ref 0–100)

## 2014-04-15 SURGERY — ABDOMINAL AORTAGRAM
Anesthesia: LOCAL

## 2014-04-15 MED ORDER — HEPARIN (PORCINE) IN NACL 2-0.9 UNIT/ML-% IJ SOLN
INTRAMUSCULAR | Status: AC
  Filled 2014-04-15: qty 1000

## 2014-04-15 MED ORDER — PHENOL 1.4 % MT LIQD: 1.0000 | OROMUCOSAL | Status: DC | PRN

## 2014-04-15 MED ORDER — MIDAZOLAM HCL 2 MG/2ML IJ SOLN
INTRAMUSCULAR | Status: AC
  Filled 2014-04-15: qty 2

## 2014-04-15 MED ORDER — LIDOCAINE HCL (PF) 1 % IJ SOLN
INTRAMUSCULAR | Status: AC
  Filled 2014-04-15: qty 30

## 2014-04-15 MED ORDER — METOPROLOL TARTRATE 1 MG/ML IV SOLN: 2.0000 mg | INTRAVENOUS | Status: DC | PRN

## 2014-04-15 MED ORDER — OXYCODONE-ACETAMINOPHEN 5-325 MG PO TABS: 1.0000 | ORAL_TABLET | ORAL | Status: DC | PRN

## 2014-04-15 MED ORDER — ONDANSETRON HCL 4 MG/2ML IJ SOLN
4.0000 mg | Freq: Four times a day (QID) | INTRAMUSCULAR | Status: DC | PRN
Start: 2014-04-15 — End: 2014-04-15

## 2014-04-15 MED ORDER — ACETAMINOPHEN 325 MG PO TABS
325.0000 mg | ORAL_TABLET | ORAL | Status: DC | PRN
  Filled 2014-04-15: qty 2

## 2014-04-15 MED ORDER — SODIUM CHLORIDE 0.9 % IV SOLN: 1.0000 mL/kg/h | INTRAVENOUS | Status: DC

## 2014-04-15 MED ORDER — SODIUM CHLORIDE 0.9 % IV SOLN
INTRAVENOUS | Status: DC
  Administered 2014-04-15: 06:00:00 via INTRAVENOUS

## 2014-04-15 MED ORDER — HYDRALAZINE HCL 20 MG/ML IJ SOLN: 10.0000 mg | INTRAMUSCULAR | Status: DC | PRN

## 2014-04-15 MED ORDER — ALUM & MAG HYDROXIDE-SIMETH 200-200-20 MG/5ML PO SUSP
15.0000 mL | ORAL | Status: DC | PRN
  Filled 2014-04-15: qty 30

## 2014-04-15 MED ORDER — GUAIFENESIN-DM 100-10 MG/5ML PO SYRP
15.0000 mL | ORAL_SOLUTION | ORAL | Status: DC | PRN
  Filled 2014-04-15: qty 15

## 2014-04-15 MED ORDER — MORPHINE SULFATE 10 MG/ML IJ SOLN: 2.0000 mg | INTRAMUSCULAR | Status: DC | PRN

## 2014-04-15 MED ORDER — FENTANYL CITRATE 0.05 MG/ML IJ SOLN
INTRAMUSCULAR | Status: AC
  Filled 2014-04-15: qty 2

## 2014-04-15 MED ORDER — ACETAMINOPHEN 325 MG RE SUPP
325.0000 mg | RECTAL | Status: DC | PRN
  Filled 2014-04-15: qty 2

## 2014-04-15 MED ORDER — LABETALOL HCL 5 MG/ML IV SOLN: 10.0000 mg | INTRAVENOUS | Status: DC | PRN

## 2014-04-15 SURGICAL SUPPLY — 55 items
ADH SKN CLS APL DERMABOND .7 (GAUZE/BANDAGES/DRESSINGS) ×2
BANDAGE ELASTIC 4 VELCRO ST LF (GAUZE/BANDAGES/DRESSINGS) IMPLANT
BANDAGE ESMARK 6X9 LF (GAUZE/BANDAGES/DRESSINGS) IMPLANT
BNDG CMPR 9X6 STRL LF SNTH (GAUZE/BANDAGES/DRESSINGS)
BNDG ESMARK 6X9 LF (GAUZE/BANDAGES/DRESSINGS)
CANISTER SUCTION 2500CC (MISCELLANEOUS) ×3 IMPLANT
CLIP TI MEDIUM 24 (CLIP) ×3 IMPLANT
CLIP TI WIDE RED SMALL 24 (CLIP) ×3 IMPLANT
COVER SURGICAL LIGHT HANDLE (MISCELLANEOUS) ×3 IMPLANT
CUFF TOURNIQUET SINGLE 24IN (TOURNIQUET CUFF) IMPLANT
CUFF TOURNIQUET SINGLE 34IN LL (TOURNIQUET CUFF) IMPLANT
CUFF TOURNIQUET SINGLE 44IN (TOURNIQUET CUFF) IMPLANT
DERMABOND ADVANCED (GAUZE/BANDAGES/DRESSINGS) ×1
DERMABOND ADVANCED .7 DNX12 (GAUZE/BANDAGES/DRESSINGS) ×2 IMPLANT
DRAIN CHANNEL 15F RND FF W/TCR (WOUND CARE) IMPLANT
DRAPE WARM FLUID 44X44 (DRAPE) ×3 IMPLANT
DRAPE X-RAY CASS 24X20 (DRAPES) IMPLANT
DRSG COVADERM 4X10 (GAUZE/BANDAGES/DRESSINGS) IMPLANT
DRSG COVADERM 4X8 (GAUZE/BANDAGES/DRESSINGS) IMPLANT
ELECT REM PT RETURN 9FT ADLT (ELECTROSURGICAL) ×3
ELECTRODE REM PT RTRN 9FT ADLT (ELECTROSURGICAL) ×2 IMPLANT
EVACUATOR SILICONE 100CC (DRAIN) IMPLANT
GLOVE BIOGEL PI IND STRL 7.5 (GLOVE) ×2 IMPLANT
GLOVE BIOGEL PI INDICATOR 7.5 (GLOVE) ×1
GLOVE SURG SS PI 7.5 STRL IVOR (GLOVE) ×3 IMPLANT
GOWN PREVENTION PLUS XXLARGE (GOWN DISPOSABLE) ×3 IMPLANT
GOWN STRL NON-REIN LRG LVL3 (GOWN DISPOSABLE) ×9 IMPLANT
HEMOSTAT SNOW SURGICEL 2X4 (HEMOSTASIS) IMPLANT
KIT BASIN OR (CUSTOM PROCEDURE TRAY) ×3 IMPLANT
KIT ROOM TURNOVER OR (KITS) ×3 IMPLANT
MARKER GRAFT CORONARY BYPASS (MISCELLANEOUS) IMPLANT
NS IRRIG 1000ML POUR BTL (IV SOLUTION) ×6 IMPLANT
PACK PERIPHERAL VASCULAR (CUSTOM PROCEDURE TRAY) ×3 IMPLANT
PAD ARMBOARD 7.5X6 YLW CONV (MISCELLANEOUS) ×6 IMPLANT
PADDING CAST COTTON 6X4 STRL (CAST SUPPLIES) IMPLANT
SET COLLECT BLD 21X3/4 12 (NEEDLE) IMPLANT
STOPCOCK 4 WAY LG BORE MALE ST (IV SETS) IMPLANT
SUT ETHILON 3 0 PS 1 (SUTURE) IMPLANT
SUT PROLENE 5 0 C 1 24 (SUTURE) ×3 IMPLANT
SUT PROLENE 6 0 BV (SUTURE) ×3 IMPLANT
SUT PROLENE 7 0 BV 1 (SUTURE) IMPLANT
SUT SILK 2 0 SH (SUTURE) ×3 IMPLANT
SUT SILK 3 0 (SUTURE)
SUT SILK 3-0 18XBRD TIE 12 (SUTURE) IMPLANT
SUT VIC AB 2-0 CT1 27 (SUTURE) ×6
SUT VIC AB 2-0 CT1 TAPERPNT 27 (SUTURE) ×4 IMPLANT
SUT VIC AB 3-0 SH 27 (SUTURE) ×6
SUT VIC AB 3-0 SH 27X BRD (SUTURE) ×4 IMPLANT
SUT VICRYL 4-0 PS2 18IN ABS (SUTURE) ×6 IMPLANT
TOWEL OR 17X24 6PK STRL BLUE (TOWEL DISPOSABLE) ×6 IMPLANT
TOWEL OR 17X26 10 PK STRL BLUE (TOWEL DISPOSABLE) ×6 IMPLANT
TRAY FOLEY CATH 16FRSI W/METER (SET/KITS/TRAYS/PACK) ×3 IMPLANT
TUBING EXTENTION W/L.L. (IV SETS) IMPLANT
UNDERPAD 30X30 INCONTINENT (UNDERPADS AND DIAPERS) ×3 IMPLANT
WATER STERILE IRR 1000ML POUR (IV SOLUTION) ×3 IMPLANT

## 2014-04-15 NOTE — H&P (View-Only) (Signed)
[facility] [place] HISTORY AND PHYSICAL   MRN : [id number]  History of Present Illness:   Tyler [name] is a 80 y.o. male patient of Dr. Trula Slade who is s/p right CEA in 07/17/09 by Dr. Trula Slade, Aortobifemoral bypass grafting and bilateral renal artery bypass graft in1995, and right femoral to above knee popliteal artery bypass using vein graft in 1995 and 2003 by Dr. Amedeo Plenty.  He returns today for LE arterial surveillance.  Bilateral buttocks tired feeling after walking about a hundred yards, relieved by rest, he denies claudication symptoms in thighs or calves.  Patient denies non-healing wounds.  He denies any recent of remote history of stroke or TIA symptoms.   Pt Diabetic: No  Pt smoker: former smoker, quit 15 years ago  Pt meds include:  Statin :Yes  Betablocker: Yes  ASA: Yes  Other anticoagulants/antiplatelets: no    Current Outpatient Prescriptions  Medication Sig Dispense Refill  . aspirin EC 81 MG tablet Take 81 mg by mouth daily.        . carvedilol (COREG) 6.25 MG tablet Take 6.25 mg by mouth 2 (two) times daily with a meal.        . dicyclomine (BENTYL) 10 MG capsule Take 10 mg by mouth 4 (four) times daily -  before meals and at bedtime.      Marland Kitchen doxazosin (CARDURA) 4 MG tablet Take 4 mg by mouth at bedtime.        . Multiple Vitamin (MULTIVITAMIN) capsule Take 1 capsule by mouth daily.        Marland Kitchen NIFEdipine (PROCARDIA XL/ADALAT-CC) 60 MG 24 hr tablet Take 60 mg by mouth 2 (two) times daily.        . nitroGLYCERIN (NITROSTAT) 0.4 MG SL tablet Place 0.4 mg under the tongue every 5 (five) minutes as needed. Pt  Has patch on everyday      . omeprazole (PRILOSEC) 20 MG capsule Take 20 mg by mouth daily.        . simvastatin (ZOCOR) 20 MG tablet Take 20 mg by mouth at bedtime.         No current facility-administered medications for this visit.    Past Medical History  Diagnosis Date  . Hypertension   . Myocardial infarction 2009  . CHF (congestive  heart failure)   . Hyperlipidemia   . Carotid artery occlusion   . Ulcer   . Cancer     kidney  . Peripheral vascular disease   . COPD (chronic obstructive pulmonary disease)   . Coronary artery disease   . DVT (deep venous thrombosis)     Past Surgical History  Procedure Laterality Date  . Femoral-popliteal bypass graft  1995 and 2003  . Carotid endarterectomy  07/17/2009    right  . Pr vein bypass graft,aorto-fem-pop    . Aortic arch repair  1995  . Kidney surgery      resection of right renal mass    Social History History  Substance Use Topics  . Smoking status: Former Smoker -- 1.00 packs/day for 40 years    Types: Cigarettes    Quit date: 10/11/1999  . Smokeless tobacco: Never Used  . Alcohol Use: No    Family History Family History  Problem Relation Age of Onset  . Hypertension Other   . Coronary artery disease Other   . Alzheimer's disease Other   . Other Other     AAA  . Arthritis Other   . Diabetes Other   .  Seizures Other   . Heart disease Father     Aneurysm, Abdominal Aortic    Allergies  Allergen Reactions  . Penicillins Swelling and Rash     REVIEW OF SYSTEMS: See HPI for pertinent positives and negatives.  Physical Examination Filed Vitals:   04/09/14 1125 04/09/14 1127  BP: 150/79 147/77  Pulse: 60 66  Resp:  16  Height:  5\' 7"  (1.702 m)  Weight:  159 lb (72.122 kg)  SpO2:  99%   Body mass index is 24.9 kg/(m^2).  General: A&O x 3, WDWN.  Gait: normal  Eyes: PERRLA.  Pulmonary: Breathing is non labored.  Cardiac: regular Rythm , without detected murmur.   Aorta is not palpable.  Radial pulses: 2+ palpable and =.   VASCULAR EXAM:  Extremities without ischemic changes  without Gangrene; without open wounds.   LE Pulses  LEFT  RIGHT   FEMORAL  not palpable  palpable   POPLITEAL  not palpable  not palpable   POSTERIOR TIBIAL  not palpable  palpable   DORSALIS PEDIS  ANTERIOR TIBIAL  Not palpable  Not palpable    Abdomen: soft, NT, no masses.  Skin: no rashes, no ulcers noted.  Musculoskeletal: no muscle wasting or atrophy.  Neurologic: A&O X 3; Appropriate Affect ; SENSATION: normal; MOTOR FUNCTION: moving all extremities equally. Speech is fluent/normal. CN 2-12  intact.   Non-Invasive Vascular Imaging:  VASCULAR LAB EVALUATION    INDICATION: Follow up aorta to bifemoral bypass graft    PREVIOUS INTERVENTION(S): Aorta bifemoral bypass graft with bilateral aorta to renal bypass graft by Dr. Amedeo Plenty in 1995. Right femoral to below knee popliteal bypass graft by Dr. Amedeo Plenty in 1995. Dr. Allean Found performed a right femoral to above knee popliteal bypass with saphenous vein 2003.    DUPLEX EXAM: Aorta bifemoral bypass graft    FINDINGS:     IMPRESSION:  1. Patent right limb of the aorta to femoral bypass graft, unable to obtain increased velocity as shown on prior exam. 2. 50 - 99% stenosis of the left limb of the bypass graft. 3. See attached.   LOWER EXTREMITY ARTERIAL DUPLEX EVALUATION    INDICATION: Follow up bypass graft    PREVIOUS INTERVENTION(S): Aorta bifemoral bypass graft with bilateral aorta to renal bypass graft by Dr. Amedeo Plenty in 1995. Right femoral to below knee popliteal bypass graft by Dr. Amedeo Plenty in 1995. Dr. Allean Found performed a right femoral to above knee popliteal bypass with saphenous vein 2003.    DUPLEX EXAM: Right lower extremity duplex    RIGHT  LEFT   Peak Systolic Velocity (cm/s) Ratio (if abnormal) Waveform  Peak Systolic Velocity (cm/s) Ratio (if abnormal) Waveform  136  T Inflow Artery     85  B Proximal Anastomosis     37  B Proximal Graft     55  B Mid Graft     83  B  Distal Graft     39  B Distal Anastomosis     288  B Outflow Artery     1.02 Today's ABI / TBI .65  .99 Previous ABI / TBI ( 01/06/14 ) .67    Waveform:    M - Monophasic       B - Biphasic       T - Triphasic  If Ankle Brachial Index (ABI) or Toe Brachial Index (TBI) performed, please see complete report      ADDITIONAL FINDINGS:     IMPRESSION: 1. Patent right  femoral to below knee popliteal bypass graft with greater than 50% stenosis in the outflow artery.    Compared to the previous exam:  No change   CEREBROVASCULAR DUPLEX EVALUATION    INDICATION: Carotid artery disease    PREVIOUS INTERVENTION(S): Aorta bifemoral bypass graft with bilateral aorta to renal bypass graft by Dr. Amedeo Plenty in 1995. Right femoral to below knee popliteal bypass graft by Dr. Amedeo Plenty in 1995.  Dr. Allean Found performed a right femoral to above knee popliteal bypass graft with saphenous vein. 2003. Right carotid endarterectomy 07/17/2009.    DUPLEX EXAM: Carotid duplex    RIGHT  LEFT  Peak Systolic Velocities (cm/s) End Diastolic Velocities (cm/s) Plaque LOCATION Peak Systolic Velocities (cm/s) End Diastolic Velocities (cm/s) Plaque  104 11  CCA PROXIMAL 101 11 HT  91 13  CCA MID 82 16 -  70 11 HM CCA DISTAL 104 20 -  96 4  ECA 144 11 -  72 14  ICA PROXIMAL 112 27 CP  70 18  ICA MID 119 23 -  79 25  ICA DISTAL 72 21 -    N/A ICA / CCA Ratio (PSV) 1.4  Antegrade Vertebral Flow Antegrade  643 Brachial Systolic Pressure (mmHg) 329  Triphasic Brachial Artery Waveforms Triphasic    Plaque Morphology:  HM = Homogeneous, HT = Heterogeneous, CP = Calcific Plaque, SP = Smooth Plaque, IP = Irregular Plaque     ADDITIONAL FINDINGS:     IMPRESSION: 1. Patent right carotid endarterectomy with no evidence for restenosis. 2. Less than 40% left internal carotid artery stenosis.    Compared to the previous exam:  No change.    ASSESSMENT:  Tyler [name] is a 80 y.o. male s/p right CEA in 07/17/09 by Dr. Trula Slade, Aortobifemoral bypass grafting and bilateral renal artery bypass graft in1995, and right femoral to above knee popliteal artery bypass using vein graft in 1995 and 2003 by Dr. Amedeo Plenty.   He has not history of stroke or TIA. Patent right carotid endarterectomy with no evidence for restenosis. Less than 40% left  internal carotid artery stenosis.  Bilateral buttocks tired feeling after walking about a hundred yards, relieved by rest, he denies claudication symptoms in thighs or calves.  Patient denies non-healing wounds. Patent right limb of the aorta to femoral bypass graft, unable to obtain increased velocity as shown on prior exam. 50 - 99% stenosis of the left limb of the bypass graft including velocities of 723 and 698 cm/sec.  Right ABI of 1.02 with triphasic waveforms in PT and DP arteries, left ABI at 0.67 with monophasic waveforms; remains stable compared to three months ago.   PLAN:   Based on today's exam and non-invasive vascular lab results, and after discussing with Dr. Scot Dock,  the patient will follow up for aortogram with run off with Dr. Trula Slade. I discussed in depth with the patient the nature of atherosclerosis, and emphasized the importance of maximal medical management including strict control of blood pressure, blood glucose, and lipid levels, obtaining regular exercise, and cessation of smoking.  The patient is aware that without maximal medical management the underlying atherosclerotic disease process will progress, limiting the benefit of any interventions.  The patient was given information about stroke prevention and what symptoms should prompt the patient to seek immediate medical care.  The patient was given information about PAD including signs, symptoms, treatment, what symptoms should prompt the patient to seek immediate medical care, and risk reduction measures to take. Thank you for  allowing [place] to participate in this patient's care.  Clemon Chambers, RN, MSN, FNP-C [facility] Office: [phone]  Clinic MD: Scot Dock 04/09/2014 11:40 AM

## 2014-04-15 NOTE — Op Note (Signed)
Patient name: Tyler [name] MRN: [id number] DOB: July 03, 1929 Sex: male  04/15/2014 Pre-operative Diagnosis: Bypass graft stenosis Post-operative diagnosis:  Same Surgeon:  Eldridge Abrahams Procedure Performed:  1.  ultrasound-guided access, left femoral artery  2.  abdominal aortogram  3.  bilateral lower extremity runoff   Indications:  The patient has a history of an aortobifemoral bypass graft by Dr. Amedeo Plenty.  He underwent bilateral renal artery bypass graft is well.  He is also undergone a right femoral-popliteal bypass grafting.  Ultrasound identified elevated velocities within the left limb of the graft.  He is in for further evaluation.  Procedure:  The patient was identified in the holding area and taken to room 8.  The patient was then placed supine on the table and prepped and draped in the usual sterile fashion.  A time out was called.  Ultrasound was used to evaluate the left common femoral artery.  It was patent .  A digital ultrasound image was acquired.  A micropuncture needle was used to access the left common femoral artery under ultrasound guidance.  An 018 wire was advanced without resistance and a micropuncture sheath was placed.  The 018 wire was removed and a benson wire was placed.  The micropuncture sheath was exchanged for a 5 french sheath.  An omniflush catheter was advanced over the wire to the level of L-1.  An abdominal angiogram was obtained in both the AP and lateral projections.  The catheter was then brought down to the aortic bifurcation and pelvic angiography was performed, followed by bilateral lower extremity runoff.  Findings:   Aortogram:  The suprarenal abdominal aorta is widely patent.  There is a bypass arising from the aortic graft going to the left renal artery which is widely patent.  I do not see a bypass going to the right renal artery.  I also did not see contrast opacified the right kidney.  There is approximately a 30% stenosis at the proximal  anastomosis.  The remainder of the graft is widely patent.  The right femoral anastomosis is widely patent.  The left femoral anastomosis and distal limb of the graft appears to be diffusely diseased with approximately 80% stenosis.  Right Lower Extremity:  Patulous dilatation the right common femoral artery is identified.  The common femoral artery is widely patent as is the profunda femoral artery.  There is a bypass from the common femoral artery to the above-knee popliteal artery which is widely patent.  The below knee pop the artery is widely patent.  There is two-vessel runoff via the anterior tibial and peroneal artery.  There is a high-grade stenosis within the mid anterior tibial artery.  Left Lower Extremity:  The distal end of the left limb of the aortobifemoral graft is diffusely diseased, approximately 80% stenosis leading into the patulous dilatation of the common femoral artery.  There is a low I furcation of the common femoral artery.  The profunda is patent throughout it's course.  The superficial femoral artery is diffusely diseased but patent.  The popliteal artery is patent throughout it's course.  There is three-vessel runoff down to the ankle.  The posterior tibial artery is the dominant vessel across the ankle.  Intervention:  None  Impression:  #1  approximately 30% stenosis at the proximal aortic anastomosis  #2  right renal artery bypass graft is visualized, nor is the right kidney.  #3  the right femoral above-knee popliteal bypass graft is widely patent  #4  approximate 80% stenosis is identified within the distal limb of the left distal anastomosis to the common femoral artery.  #5  surgical repair of the left groin is recommended with interposition graft from the distal portion of the left limb of the aortobifemoral bypass graft to the common femoral artery patch repair   V. Annamarie Major, M.D. [facility] [place] Office: [phone] Pager:   [phone]

## 2014-04-15 NOTE — Discharge Instructions (Signed)
Angiogram, Care After Refer to this sheet in the next few weeks. These instructions provide you with information on caring for yourself after your procedure. Your health care provider may also give you more specific instructions. Your treatment has been planned according to current medical practices, but problems sometimes occur. Call your health care provider if you have any problems or questions after your procedure.  WHAT TO EXPECT AFTER THE PROCEDURE After your procedure, it is typical to have the following sensations:  Minor discomfort or tenderness and a small bump at the catheter insertion site. The bump should usually decrease in size and tenderness within 1 to 2 weeks.  Any bruising will usually fade within 2 to 4 weeks. HOME CARE INSTRUCTIONS   You may need to keep taking blood thinners if they were prescribed for you. Only take over-the-counter or prescription medicines for pain, fever, or discomfort as directed by your health care provider.  Do not apply powder or lotion to the site.  Do not sit in a bathtub, swimming pool, or whirlpool for 5 to 7 days.  You may shower 24 hours after the procedure. Remove the bandage (dressing) and gently wash the site with plain soap and water. Gently pat the site dry.  Inspect the site at least twice daily.  Limit your activity for the first 48 hours. Do not bend, squat, or lift anything over 20 lb (9 kg) or as directed by your health care provider.  Do not drive home if you are discharged the day of the procedure. Have someone else drive you. Follow instructions about when you can drive or return to work. SEEK MEDICAL CARE IF:  You get lightheaded when standing up.  You have drainage (other than a small amount of blood on the dressing).  You have chills.  You have a fever.  You have redness, warmth, swelling, or pain at the insertion site. SEEK IMMEDIATE MEDICAL CARE IF:   You develop chest pain or shortness of breath, feel faint,  or pass out.  You have bleeding, swelling larger than a walnut, or drainage from the catheter insertion site.  You develop pain, discoloration, coldness, or severe bruising in the leg or arm that held the catheter.  You develop bleeding from any other place, such as the bowels. You may see bright red blood in your urine or stools, or your stools may appear black and tarry.  You have heavy bleeding from the site. If this happens, hold pressure on the site. MAKE SURE YOU:  Understand these instructions.  Will watch your condition.  Will get help right away if you are not doing well or get worse. Document Released: 04/14/2005 Document Revised: 10/01/2013 Document Reviewed: 02/18/2013 [facility] Patient Information 2015 [place], [place]. This information is not intended to replace advice given to you by your health care provider. Make sure you discuss any questions you have with your health care provider.

## 2014-04-15 NOTE — Telephone Encounter (Signed)
Message copied by Gena Fray on Tue Apr 15, 2014 10:42 AM ------      Message from: Denman George      Created: Tue Apr 15, 2014  9:07 AM      Regarding: needs OV with [facility] in July/discuss surgery                   ----- Message -----         From: Serafina Mitchell, MD         Sent: 04/15/2014   8:53 AM           To: [facility] Charge Pool            04/15/2014:            Surgeon:  Eldridge Abrahams      Procedure Performed:       1.  ultrasound-guided access, left femoral artery       2.  abdominal aortogram       3.  bilateral lower extremity runoff            Please schedule the patient to come see me in the office to sometime in July for discussions of a left femoral endarterectomy ------

## 2014-04-15 NOTE — Progress Notes (Signed)
Report given to [facility]  RN in Short stay .

## 2014-04-15 NOTE — Progress Notes (Signed)
Received pt from [facility] lab post procedure alert and denies any discomfort .

## 2014-04-15 NOTE — Telephone Encounter (Signed)
04/15/14: left message for pt and asked for call confirmation, dpm

## 2014-04-15 NOTE — Interval H&P Note (Signed)
History and Physical Interval Note:  04/15/2014 7:47 AM  Tyler [name]  has presented today for surgery, with the diagnosis of pvd  The various methods of treatment have been discussed with the patient and family. After consideration of risks, benefits and other options for treatment, the patient has consented to  Procedure(s): ABDOMINAL AORTAGRAM (N/A) as a surgical intervention .  The patient's history has been reviewed, patient examined, no change in status, stable for surgery.  I have reviewed the patient's chart and labs.  Questions were answered to the patient's satisfaction.     BRABHAM IV, V. WELLS

## 2014-05-02 ENCOUNTER — Encounter: Payer: Self-pay | Admitting: Surgery

## 2014-05-05 ENCOUNTER — Other Ambulatory Visit: Payer: Self-pay

## 2014-05-05 ENCOUNTER — Encounter: Payer: Self-pay | Admitting: Surgery

## 2014-05-05 ENCOUNTER — Ambulatory Visit (INDEPENDENT_AMBULATORY_CARE_PROVIDER_SITE_OTHER): Payer: Medicare Other | Admitting: Surgery

## 2014-05-05 VITALS — BP 204/79 | HR 52 | Ht 67.0 in | Wt 160.5 lb

## 2014-05-05 DIAGNOSIS — I739 Peripheral vascular disease, unspecified: Secondary | ICD-10-CM

## 2014-05-05 NOTE — Progress Notes (Signed)
Patient name: Tyler [name] MRN: [id number] DOB: Aug 25, 1929 Sex: male     Chief Complaint  Patient presents with  . Re-evaluation    f/u to discuss L femoral endarterectomy    HISTORY OF PRESENT ILLNESS: The patient is back today for followup. He is a former patient of Dr. Amedeo Plenty. He is status post complex aortic reconstruction with an aortobifemoral bypass graft and bilateral renal artery bypass graft. He is also status post right femoral-popliteal bypass graft in 1995 and 2003. I performed a right carotid endarterectomy for asymptomatic stenosis.  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  He underwent angiography on 04/15/2014 to better define this.  I found approximately 80% stenosis within the distal limb of the aortobifemoral graft.  The patient does have a stable claudication symptoms.   Past Medical History  Diagnosis Date  . Hypertension   . Myocardial infarction 2009  . CHF (congestive heart failure)   . Hyperlipidemia   . Carotid artery occlusion   . Ulcer   . Cancer     kidney  . Peripheral vascular disease   . COPD (chronic obstructive pulmonary disease)   . Coronary artery disease   . DVT (deep venous thrombosis)     Past Surgical History  Procedure Laterality Date  . Femoral-popliteal bypass graft  1995 and 2003  . Carotid endarterectomy  07/17/2009    right  . Pr vein bypass graft,aorto-fem-pop    . Aortic arch repair  1995  . Kidney surgery      resection of right renal mass    History   Social History  . Marital Status: Married    Spouse Name: N/A    Number of Children: N/A  . Years of Education: N/A   Occupational History  . Not on file.   Social History Main Topics  . Smoking status: Former Smoker -- 1.00 packs/day for 40 years    Types: Cigarettes    Quit date: 10/11/1999  . Smokeless tobacco: Never Used  . Alcohol Use: No  . Drug Use: No  . Sexual Activity: Not on file   Other Topics Concern  . Not on  file   Social History Narrative  . No narrative on file    Family History  Problem Relation Age of Onset  . Hypertension Other   . Coronary artery disease Other   . Alzheimer's disease Other   . Other Other     AAA  . Arthritis Other   . Diabetes Other   . Seizures Other   . Heart disease Father     Aneurysm, Abdominal Aortic    Allergies as of 05/05/2014 - Review Complete 05/05/2014  Allergen Reaction Noted  . Penicillins Swelling and Rash 09/07/2011    Current Outpatient Prescriptions on File Prior to Visit  Medication Sig Dispense Refill  . aspirin EC 81 MG tablet Take 81 mg by mouth daily.        . carvedilol (COREG) 6.25 MG tablet Take 6.25 mg by mouth 2 (two) times daily with a meal.        . dicyclomine (BENTYL) 10 MG capsule Take 10 mg by mouth 4 (four) times daily -  before meals and at bedtime.      Marland Kitchen doxazosin (CARDURA) 4 MG tablet Take 4 mg by mouth at bedtime.        . Multiple Vitamin (MULTIVITAMIN) capsule Take 1 capsule by mouth daily.        Marland Kitchen  NIFEdipine (PROCARDIA XL/ADALAT-CC) 60 MG 24 hr tablet Take 60 mg by mouth 2 (two) times daily.        . nitroGLYCERIN (NITROSTAT) 0.4 MG SL tablet Place 0.4 mg under the tongue every 5 (five) minutes as needed. Pt  Has patch on everyday      . omeprazole (PRILOSEC) 20 MG capsule Take 20 mg by mouth daily.        . simvastatin (ZOCOR) 20 MG tablet Take 20 mg by mouth at bedtime.         No current facility-administered medications on file prior to visit.     REVIEW OF SYSTEMS: Please see review of systems from last visit with Tyler [name]  PHYSICAL EXAMINATION:   Vital signs are BP 204/79  Pulse 52  Ht 5\' 7"  (1.702 m)  Wt 160 lb 8 oz (72.802 kg)  BMI 25.13 kg/m2  SpO2 100% General: The patient appears their stated age. HEENT:  No gross abnormalities Pulmonary:  Non labored breathing Musculoskeletal: There are no major deformities. Neurologic: No focal weakness or paresthesias are detected, Skin: There  are no ulcer or rashes noted. Psychiatric: The patient has normal affect. Cardiovascular: There is a regular rate and rhythm without significant murmur appreciated.   Diagnostic Studies None  Assessment: Bypass graft stenosis, left limb of aortobifemoral graft Plan: I have reviewed the patient's operative note from Dr. Amedeo Plenty.  It appears that he has only had one intervention in the left groin.  He did have to have the distal anastomosis opened up during the original operation as there was a intimal flap which had to be tacked down.  I discussed treatment options with the patient which would be treating this area which I believe is within the aortobifemoral graft, coming from the left brachial approach and perform balloon angioplasty.  I also discussed surgical revision with interposition graft.  After a lengthy discussion, we have elected to proceed with balloon angioplasty.  This will be scheduled for Tuesday, August 18 via the left brachial approach.  The patient's blood pressure was elevated today.  I am giving him scheduled to see nephrology today  V. Leia Alf, M.D. [facility] [place] Office: [phone] Pager:  [phone]

## 2014-05-14 ENCOUNTER — Encounter (HOSPITAL_COMMUNITY): Payer: Self-pay | Admitting: Pharmacy Technician

## 2014-05-26 MED ORDER — SODIUM CHLORIDE 0.9 % IV SOLN
INTRAVENOUS | Status: DC
  Administered 2014-05-27: 09:00:00 via INTRAVENOUS

## 2014-05-27 ENCOUNTER — Ambulatory Visit (HOSPITAL_COMMUNITY)
Admission: RE | Admit: 2014-05-27 | Discharge: 2014-05-27 | Disposition: A | Discharge status: Home / Self Care | Payer: Medicare Other | Source: Ambulatory Visit | Attending: Surgery | Admitting: Surgery

## 2014-05-27 ENCOUNTER — Other Ambulatory Visit: Payer: Self-pay

## 2014-05-27 ENCOUNTER — Encounter (HOSPITAL_COMMUNITY)
Admission: RE | Disposition: A | Discharge status: Home / Self Care | Payer: Self-pay | Source: Ambulatory Visit | Attending: Surgery

## 2014-05-27 DIAGNOSIS — Y832 Surgical operation with anastomosis, bypass or graft as the cause of abnormal reaction of the patient, or of later complication, without mention of misadventure at the time of the procedure: Secondary | ICD-10-CM | POA: Insufficient documentation

## 2014-05-27 DIAGNOSIS — I739 Peripheral vascular disease, unspecified: Secondary | ICD-10-CM

## 2014-05-27 DIAGNOSIS — I10 Essential (primary) hypertension: Secondary | ICD-10-CM | POA: Diagnosis not present

## 2014-05-27 DIAGNOSIS — I509 Heart failure, unspecified: Secondary | ICD-10-CM | POA: Insufficient documentation

## 2014-05-27 DIAGNOSIS — E785 Hyperlipidemia, unspecified: Secondary | ICD-10-CM | POA: Insufficient documentation

## 2014-05-27 DIAGNOSIS — T82898A Other specified complication of vascular prosthetic devices, implants and grafts, initial encounter: Secondary | ICD-10-CM | POA: Diagnosis not present

## 2014-05-27 DIAGNOSIS — I2510 Atherosclerotic heart disease of native coronary artery without angina pectoris: Secondary | ICD-10-CM | POA: Diagnosis not present

## 2014-05-27 DIAGNOSIS — Z86718 Personal history of other venous thrombosis and embolism: Secondary | ICD-10-CM | POA: Diagnosis not present

## 2014-05-27 DIAGNOSIS — Z87891 Personal history of nicotine dependence: Secondary | ICD-10-CM | POA: Insufficient documentation

## 2014-05-27 DIAGNOSIS — J4489 Other specified chronic obstructive pulmonary disease: Secondary | ICD-10-CM | POA: Insufficient documentation

## 2014-05-27 DIAGNOSIS — Z48812 Encounter for surgical aftercare following surgery on the circulatory system: Secondary | ICD-10-CM

## 2014-05-27 DIAGNOSIS — I252 Old myocardial infarction: Secondary | ICD-10-CM | POA: Insufficient documentation

## 2014-05-27 DIAGNOSIS — Z7982 Long term (current) use of aspirin: Secondary | ICD-10-CM | POA: Insufficient documentation

## 2014-05-27 DIAGNOSIS — J449 Chronic obstructive pulmonary disease, unspecified: Secondary | ICD-10-CM | POA: Insufficient documentation

## 2014-05-27 LAB — POCT I-STAT, CHEM 8
BUN: 19 mg/dL (ref 6–23)
CREATININE: 1.5 mg/dL — AB (ref 0.50–1.35)
Calcium, Ion: 1.28 mmol/L (ref 1.13–1.30)
Chloride: 107 mEq/L (ref 96–112)
GLUCOSE: 129 mg/dL — AB (ref 70–99)
HCT: 48 % (ref 39.0–52.0)
Hemoglobin: 16.3 g/dL (ref 13.0–17.0)
Potassium: 4.1 mEq/L (ref 3.7–5.3)
Sodium: 142 mEq/L (ref 137–147)
TCO2: 23 mmol/L (ref 0–100)

## 2014-05-27 LAB — POCT ACTIVATED CLOTTING TIME: Activated Clotting Time: 202 seconds

## 2014-05-27 SURGERY — PTA FEMORAL POPLITEAL ARTERY
Laterality: Left

## 2014-05-27 MED ORDER — ALUM & MAG HYDROXIDE-SIMETH 200-200-20 MG/5ML PO SUSP: 15.0000 mL | ORAL | Status: DC | PRN

## 2014-05-27 MED ORDER — LABETALOL HCL 5 MG/ML IV SOLN: 10.0000 mg | INTRAVENOUS | Status: DC | PRN

## 2014-05-27 MED ORDER — MIDAZOLAM HCL 2 MG/2ML IJ SOLN
INTRAMUSCULAR | Status: AC
  Filled 2014-05-27: qty 2

## 2014-05-27 MED ORDER — PHENOL 1.4 % MT LIQD: 1.0000 | OROMUCOSAL | Status: DC | PRN

## 2014-05-27 MED ORDER — HEPARIN (PORCINE) IN NACL 2-0.9 UNIT/ML-% IJ SOLN
INTRAMUSCULAR | Status: AC
  Filled 2014-05-27: qty 1000

## 2014-05-27 MED ORDER — ONDANSETRON HCL 4 MG/2ML IJ SOLN: 4.0000 mg | Freq: Four times a day (QID) | INTRAMUSCULAR | Status: DC | PRN

## 2014-05-27 MED ORDER — LIDOCAINE HCL (PF) 1 % IJ SOLN
INTRAMUSCULAR | Status: AC
  Filled 2014-05-27: qty 30

## 2014-05-27 MED ORDER — ACETAMINOPHEN 325 MG PO TABS: 325.0000 mg | ORAL_TABLET | ORAL | Status: DC | PRN

## 2014-05-27 MED ORDER — MORPHINE SULFATE 10 MG/ML IJ SOLN: 2.0000 mg | INTRAMUSCULAR | Status: DC | PRN

## 2014-05-27 MED ORDER — HYDRALAZINE HCL 20 MG/ML IJ SOLN: 10.0000 mg | INTRAMUSCULAR | Status: DC | PRN

## 2014-05-27 MED ORDER — METOPROLOL TARTRATE 1 MG/ML IV SOLN: 2.0000 mg | INTRAVENOUS | Status: DC | PRN

## 2014-05-27 MED ORDER — SODIUM CHLORIDE 0.9 % IV SOLN: 1.0000 mL/kg/h | INTRAVENOUS | Status: DC

## 2014-05-27 MED ORDER — NITROGLYCERIN 0.2 MG/ML ON CALL CATH LAB
INTRAVENOUS | Status: AC
  Filled 2014-05-27: qty 1

## 2014-05-27 MED ORDER — HEPARIN SODIUM (PORCINE) 1000 UNIT/ML IJ SOLN
INTRAMUSCULAR | Status: AC
  Filled 2014-05-27: qty 1

## 2014-05-27 MED ORDER — OXYCODONE HCL 5 MG PO TABS: 5.0000 mg | ORAL_TABLET | ORAL | Status: DC | PRN

## 2014-05-27 MED ORDER — ACETAMINOPHEN 325 MG RE SUPP: 325.0000 mg | RECTAL | Status: DC | PRN

## 2014-05-27 MED ORDER — FENTANYL CITRATE 0.05 MG/ML IJ SOLN
INTRAMUSCULAR | Status: AC
  Filled 2014-05-27: qty 2

## 2014-05-27 MED ORDER — GUAIFENESIN-DM 100-10 MG/5ML PO SYRP: 15.0000 mL | ORAL_SOLUTION | ORAL | Status: DC | PRN

## 2014-05-27 NOTE — Progress Notes (Signed)
Site area: left brachial Site Prior to Removal:  Level 0 Pressure Applied For: 20 minutes, sheath removed by [facility] Manual:   yes Patient Status During Pull:  stable Post Pull Site:  Level 0 Post Pull Instructions Given:  yes Post Pull Pulses Present: yes Dressing Applied:  yes Bedrest begins @ 1194 Comments: no complications

## 2014-05-27 NOTE — Progress Notes (Signed)
Left brachial arterial sheath removed by [facility]

## 2014-05-27 NOTE — H&P (View-Only) (Signed)
Patient name: Tyler [name] MRN: [id number] DOB: 11-10-1928 Sex: male     Chief Complaint  Patient presents with  . Re-evaluation    f/u to discuss L femoral endarterectomy    HISTORY OF PRESENT ILLNESS: The patient is back today for followup. He is a former patient of Dr. Amedeo Plenty. He is status post complex aortic reconstruction with an aortobifemoral bypass graft and bilateral renal artery bypass graft. He is also status post right femoral-popliteal bypass graft in 1995 and 2003. I performed a right carotid endarterectomy for asymptomatic stenosis.  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  He underwent angiography on 04/15/2014 to better define this.  I found approximately 80% stenosis within the distal limb of the aortobifemoral graft.  The patient does have a stable claudication symptoms.   Past Medical History  Diagnosis Date  . Hypertension   . Myocardial infarction 2009  . CHF (congestive heart failure)   . Hyperlipidemia   . Carotid artery occlusion   . Ulcer   . Cancer     kidney  . Peripheral vascular disease   . COPD (chronic obstructive pulmonary disease)   . Coronary artery disease   . DVT (deep venous thrombosis)     Past Surgical History  Procedure Laterality Date  . Femoral-popliteal bypass graft  1995 and 2003  . Carotid endarterectomy  07/17/2009    right  . Pr vein bypass graft,aorto-fem-pop    . Aortic arch repair  1995  . Kidney surgery      resection of right renal mass    History   Social History  . Marital Status: Married    Spouse Name: N/A    Number of Children: N/A  . Years of Education: N/A   Occupational History  . Not on file.   Social History Main Topics  . Smoking status: Former Smoker -- 1.00 packs/day for 40 years    Types: Cigarettes    Quit date: 10/11/1999  . Smokeless tobacco: Never Used  . Alcohol Use: No  . Drug Use: No  . Sexual Activity: Not on file   Other Topics Concern  . Not on  file   Social History Narrative  . No narrative on file    Family History  Problem Relation Age of Onset  . Hypertension Other   . Coronary artery disease Other   . Alzheimer's disease Other   . Other Other     AAA  . Arthritis Other   . Diabetes Other   . Seizures Other   . Heart disease Father     Aneurysm, Abdominal Aortic    Allergies as of 05/05/2014 - Review Complete 05/05/2014  Allergen Reaction Noted  . Penicillins Swelling and Rash 09/07/2011    Current Outpatient Prescriptions on File Prior to Visit  Medication Sig Dispense Refill  . aspirin EC 81 MG tablet Take 81 mg by mouth daily.        . carvedilol (COREG) 6.25 MG tablet Take 6.25 mg by mouth 2 (two) times daily with a meal.        . dicyclomine (BENTYL) 10 MG capsule Take 10 mg by mouth 4 (four) times daily -  before meals and at bedtime.      Marland Kitchen doxazosin (CARDURA) 4 MG tablet Take 4 mg by mouth at bedtime.        . Multiple Vitamin (MULTIVITAMIN) capsule Take 1 capsule by mouth daily.        Marland Kitchen  NIFEdipine (PROCARDIA XL/ADALAT-CC) 60 MG 24 hr tablet Take 60 mg by mouth 2 (two) times daily.        . nitroGLYCERIN (NITROSTAT) 0.4 MG SL tablet Place 0.4 mg under the tongue every 5 (five) minutes as needed. Pt  Has patch on everyday      . omeprazole (PRILOSEC) 20 MG capsule Take 20 mg by mouth daily.        . simvastatin (ZOCOR) 20 MG tablet Take 20 mg by mouth at bedtime.         No current facility-administered medications on file prior to visit.     REVIEW OF SYSTEMS: Please see review of systems from last visit with [name]  PHYSICAL EXAMINATION:   Vital signs are BP 204/79  Pulse 52  Ht 5\' 7"  (1.702 m)  Wt 160 lb 8 oz (72.802 kg)  BMI 25.13 kg/m2  SpO2 100% General: The patient appears their stated age. HEENT:  No gross abnormalities Pulmonary:  Non labored breathing Musculoskeletal: There are no major deformities. Neurologic: No focal weakness or paresthesias are detected, Skin: There  are no ulcer or rashes noted. Psychiatric: The patient has normal affect. Cardiovascular: There is a regular rate and rhythm without significant murmur appreciated.   Diagnostic Studies None  Assessment: Bypass graft stenosis, left limb of aortobifemoral graft Plan: I have reviewed the patient's operative note from Dr. Amedeo Plenty.  It appears that he has only had one intervention in the left groin.  He did have to have the distal anastomosis opened up during the original operation as there was a intimal flap which had to be tacked down.  I discussed treatment options with the patient which would be treating this area which I believe is within the aortobifemoral graft, coming from the left brachial approach and perform balloon angioplasty.  I also discussed surgical revision with interposition graft.  After a lengthy discussion, we have elected to proceed with balloon angioplasty.  This will be scheduled for Tuesday, August 18 via the left brachial approach.  The patient's blood pressure was elevated today.  I am giving him scheduled to see nephrology today  V. Leia Alf, M.D. [facility] [place] Office: [phone] Pager:  [phone]

## 2014-05-27 NOTE — Op Note (Signed)
Patient name: Tyler [name] MRN: [id number] DOB: June 04, 1929 Sex: male  05/27/2014 Pre-operative Diagnosis: Bypass graft stenosis Post-operative diagnosis:  Same Surgeon:  Eldridge Abrahams Procedure Performed:  1.  ultrasound-guided access, left brachial artery  2.  left lower extremity runoff  3.  drug coated balloon angioplasty left external iliac and left common femoral artery \  Indications:  The patient previously underwent angiography which revealed a stenosis within his left distal external iliac and common femoral artery.  He has a history of an aortobifemoral bypass graft.  This lesion most likely correlated with the distal limb of the left side of his bypass graft.  I felt that because of the location and we better treated surgically.  However after discussing this with the patient he wants to do everything possible to avoid an operation.  Therefore he comes back today for angiography and possible percutaneous treatment.  Procedure:  The patient was identified in the holding area and taken to room 8.  The patient was then placed supine on the table and prepped and draped in the usual sterile fashion.  A time out was called.  Ultrasound was used to evaluate the brachial artery.  It was widely patent without significant calcification.  One percent lidocaine was used for local anesthesia.  The left brachial artery was then cannulated at the antecubital crease under ultrasound guidance with a micropuncture needle.  An 018 wire was advanced without resistance and a micropuncture sheath was placed.  A Foley wire was then inserted followed by a short 6 [place] sheath.  A Simmons catheter was then used to gain access into the descending thoracic aorta.  The wire was then easily advanced into the left limb of the bypass graft.  The short 6 French sheath was then removed and exchanged for a long 6 [place] sheath which was advanced into the left limb of the aortobifemoral bypass graft.  The patient  was then fully heparinized.  I used a 014 [place] core to gain access across the lesion.  I then selected a 6 x 40 Lutonix drug coated balloon and performed balloon angioplasty of the left common femoral and distal external iliac artery.  The balloon was taken to burst pressure for 2 minutes.  Completion angiography revealed suboptimal results.  I elected to up size the balloon to an 8 x 40 balloon.  This was a Cordis balloon.  It was taken to 20 atmospheres and held up for 2 minutes.  Completion angiography revealed improved results but there was still residual narrowing.  I then utilized a 7 x 20 Cordis balloon to try to treat the residual focal narrowing.  This was taken to 20 atmospheres.  Followup angiography revealed persistent narrowing at this level.  It did appear to be improved from pre-intervention.  There was residual stenosis of approximately 40%.  I was reluctant to proceed with more aggressive angioplasty, as I was concerned that this could lead to rupture of the lesion at this area.  Also because of the location of the lesion this is not extent of the lesion.  Therefore elected to stop the procedure.  I checked pullback pressures across the aortobifemoral bypass graft bifurcation there was no pressure gradient.  A long sheath was exchanged out for a short 6 [place] sheath.  There were no complications.   Impression:  #1  drug coated balloon angioplasty of the left external iliac and common femoral lesion with a 6 x 40 balloon.  Additional balloon  angioplasty was performed with an 8 x 40 and 7 x 20 balloon  #2  no pressure gradient across the origin of the left limb of the aortobifemoral bypass graft    V. Annamarie Major, M.D. [facility] [place] Office: [phone] Pager:  [phone]

## 2014-05-27 NOTE — Discharge Instructions (Signed)
Arteriogram Care After These instructions give you information on caring for yourself after your procedure. Your doctor may also give you more specific instructions. Call your doctor if you have any problems or questions after your procedure. HOME CARE  Do not bathe, swim, or use a hot tub until directed by your doctor. You can shower.  Do not lift anything heavier than 10 pounds (about a gallon of milk) for 2 days.  Do not walk a lot, run, or drive for 2 days.  Return to normal activities in 2 days or as told by your doctor. Finding out the results of your test Ask when your test results will be ready. Make sure you get your test results. GET HELP RIGHT AWAY IF:   You have fever.  You have more pain in your arm.  The arm that was cut is:  Bleeding.  Puffy (swollen) or red.  Cold.  Pale or changes color.  Weak.  Tingly or numb. If you go to the Emergency Room, tell your nurse that you have had an arteriogram. Take this paper with you to show the nurse. MAKE SURE YOU:  Understand these instructions.  Will watch your condition.  Will get help right away if you are not doing well or get worse. Document Released: 12/23/2008 Document Revised: 10/01/2013 Document Reviewed: 12/23/2008 [facility] Patient Information 2015 [place], [place]. This information is not intended to replace advice given to you by your health care provider. Make sure you discuss any questions you have with your health care provider.

## 2014-05-27 NOTE — Interval H&P Note (Signed)
History and Physical Interval Note:  05/27/2014 12:34 PM  Tyler [name]  has presented today for surgery, with the diagnosis of pvd, left limb aorta-biFem graft stenosis  The various methods of treatment have been discussed with the patient and family. After consideration of risks, benefits and other options for treatment, the patient has consented to  Procedure(s): LOWER EXTREMITY ANGIOGRAM (Left) as a surgical intervention .  The patient's history has been reviewed, patient examined, no change in status, stable for surgery.  I have reviewed the patient's chart and labs.  Questions were answered to the patient's satisfaction.     BRABHAM IV, V. WELLS

## 2014-05-28 ENCOUNTER — Telehealth: Payer: Self-pay | Admitting: Surgery

## 2014-05-28 LAB — POCT ACTIVATED CLOTTING TIME: Activated Clotting Time: 157 seconds

## 2014-05-28 NOTE — Telephone Encounter (Addendum)
Message copied by Gena Fray on Wed May 28, 2014 12:43 PM ------      Message from: Denman George      Created: Tue May 27, 2014  4:06 PM      Regarding: Zigmund Daniel log; also needs 3 mo. f/u with [facility] with bil. LE Art Duplex and ABI's                   ----- Message -----         From: Serafina Mitchell, MD         Sent: 05/27/2014   1:53 PM           To: [facility] Charge Pool            05/27/2014:                  Surgeon:  Eldridge Abrahams      Procedure Performed:       1.  ultrasound-guided access, left brachial artery       2.  left lower extremity runoff       3.  drug coated balloon angioplasty left external iliac and left common femoral artery                  Followup in 3 months with a duplex ultrasound of both legs and ankle-brachial indices ------  05/28/14: spoke with pt to schedule appointment, dpm

## 2014-08-22 ENCOUNTER — Encounter: Payer: Self-pay | Admitting: Student

## 2014-08-25 ENCOUNTER — Ambulatory Visit (INDEPENDENT_AMBULATORY_CARE_PROVIDER_SITE_OTHER)
Admission: RE | Admit: 2014-08-25 | Discharge: 2014-08-25 | Disposition: A | Discharge status: Home / Self Care | Payer: Medicare Other | Source: Ambulatory Visit | Attending: Surgery | Admitting: Surgery

## 2014-08-25 ENCOUNTER — Ambulatory Visit (HOSPITAL_COMMUNITY)
Admission: RE | Admit: 2014-08-25 | Discharge: 2014-08-25 | Disposition: A | Discharge status: Home / Self Care | Payer: Medicare Other | Source: Ambulatory Visit | Attending: Surgery | Admitting: Surgery

## 2014-08-25 ENCOUNTER — Encounter: Payer: Self-pay | Admitting: Surgery

## 2014-08-25 ENCOUNTER — Ambulatory Visit (INDEPENDENT_AMBULATORY_CARE_PROVIDER_SITE_OTHER): Payer: Medicare Other | Admitting: Surgery

## 2014-08-25 VITALS — BP 135/56 | HR 61 | Ht 67.0 in | Wt 158.4 lb

## 2014-08-25 DIAGNOSIS — I739 Peripheral vascular disease, unspecified: Secondary | ICD-10-CM

## 2014-08-25 DIAGNOSIS — I509 Heart failure, unspecified: Secondary | ICD-10-CM | POA: Diagnosis not present

## 2014-08-25 DIAGNOSIS — Z87891 Personal history of nicotine dependence: Secondary | ICD-10-CM | POA: Insufficient documentation

## 2014-08-25 DIAGNOSIS — I70219 Atherosclerosis of native arteries of extremities with intermittent claudication, unspecified extremity: Secondary | ICD-10-CM | POA: Diagnosis not present

## 2014-08-25 DIAGNOSIS — Z7982 Long term (current) use of aspirin: Secondary | ICD-10-CM | POA: Insufficient documentation

## 2014-08-25 DIAGNOSIS — I252 Old myocardial infarction: Secondary | ICD-10-CM | POA: Insufficient documentation

## 2014-08-25 DIAGNOSIS — Z48812 Encounter for surgical aftercare following surgery on the circulatory system: Secondary | ICD-10-CM

## 2014-08-25 DIAGNOSIS — E785 Hyperlipidemia, unspecified: Secondary | ICD-10-CM | POA: Diagnosis not present

## 2014-08-25 DIAGNOSIS — Z95818 Presence of other cardiac implants and grafts: Secondary | ICD-10-CM | POA: Insufficient documentation

## 2014-08-25 DIAGNOSIS — Z79899 Other long term (current) drug therapy: Secondary | ICD-10-CM | POA: Diagnosis not present

## 2014-08-25 DIAGNOSIS — I6529 Occlusion and stenosis of unspecified carotid artery: Secondary | ICD-10-CM

## 2014-08-25 DIAGNOSIS — I10 Essential (primary) hypertension: Secondary | ICD-10-CM | POA: Insufficient documentation

## 2014-08-25 NOTE — Addendum Note (Signed)
Addended by: Mena Goes on: 08/25/2014 03:38 PM   Modules accepted: Orders

## 2014-08-25 NOTE — Progress Notes (Signed)
Patient name: Tyler [name] MRN: [id number] DOB: 1929/09/10 Sex: male     Chief Complaint  Patient presents with  . Re-evaluation    3 month f/u - s/p angioplasty    HISTORY OF PRESENT ILLNESS: The patient is back today for followup. He is a former patient of Dr. Amedeo Plenty. He is status post complex aortic reconstruction with an aortobifemoral bypass graft and bilateral renal artery bypass graft. He is also status post right femoral-popliteal bypass graft in 1995 and 2003. I performed a right carotid endarterectomy for asymptomatic stenosis. 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 He underwent angiography on 04/15/2014 to better define this. I found approximately 80% stenosis within the distal limb of the aortobifemoral graft.on 05/27/2014, I treated the left external iliac stenosis with a 8 x 40 balloon.  There was residual stenosis that appears to be within the limb of the graft rather than native artery.  The patient is back today for follow-up.  He still has claudication symptoms at approximately 100 yards however these are not lifestyle limiting.  Past Medical History  Diagnosis Date  . Hypertension   . Myocardial infarction 2009  . CHF (congestive heart failure)   . Hyperlipidemia   . Carotid artery occlusion   . Ulcer   . Cancer     kidney  . Peripheral vascular disease   . COPD (chronic obstructive pulmonary disease)   . Coronary artery disease   . DVT (deep venous thrombosis)     Past Surgical History  Procedure Laterality Date  . Femoral-popliteal bypass graft  1995 and 2003  . Carotid endarterectomy  07/17/2009    right  . Pr vein bypass graft,aorto-fem-pop    . Aortic arch repair  1995  . Kidney surgery      resection of right renal mass    History   Social History  . Marital Status: Married    Spouse Name: N/A    Number of Children: N/A  . Years of Education: N/A   Occupational History  . Not on file.   Social History  Main Topics  . Smoking status: Former Smoker -- 1.00 packs/day for 40 years    Types: Cigarettes    Quit date: 10/11/1999  . Smokeless tobacco: Never Used  . Alcohol Use: No  . Drug Use: No  . Sexual Activity: Not on file   Other Topics Concern  . Not on file   Social History Narrative    Family History  Problem Relation Age of Onset  . Hypertension Other   . Coronary artery disease Other   . Alzheimer's disease Other   . Other Other     AAA  . Arthritis Other   . Diabetes Other   . Seizures Other   . Heart disease Father     Aneurysm, Abdominal Aortic    Allergies as of 08/25/2014 - Review Complete 08/25/2014  Allergen Reaction Noted  . Penicillins Swelling and Rash 09/07/2011    Current Outpatient Prescriptions on File Prior to Visit  Medication Sig Dispense Refill  . aspirin EC 81 MG tablet Take 81 mg by mouth daily.      . carvedilol (COREG) 6.25 MG tablet Take 6.25 mg by mouth 2 (two) times daily with a meal.      . dicyclomine (BENTYL) 10 MG capsule Take 10 mg by mouth 4 (four) times daily -  before meals and at bedtime.    Marland Kitchen doxazosin (CARDURA) 4  MG tablet Take 4 mg by mouth at bedtime.      . Multiple Vitamin (MULTIVITAMIN) capsule Take 1 capsule by mouth daily.      Marland Kitchen NIFEdipine (PROCARDIA XL/ADALAT-CC) 60 MG 24 hr tablet Take 60 mg by mouth 2 (two) times daily.      . nitroGLYCERIN (NITROSTAT) 0.4 MG SL tablet Place 0.4 mg under the tongue every 5 (five) minutes as needed for chest pain.     Marland Kitchen omeprazole (PRILOSEC) 20 MG capsule Take 20 mg by mouth daily.      . simvastatin (ZOCOR) 20 MG tablet Take 20 mg by mouth at bedtime.       No current facility-administered medications on file prior to visit.     REVIEW OF SYSTEMS: Please see history of present illness, otherwise all systems are negative  PHYSICAL EXAMINATION:   Vital signs are BP 135/56 mmHg  Pulse 61  Ht 5\' 7"  (1.702 m)  Wt 158 lb 6.4 oz (71.85 kg)  BMI 24.80 kg/m2  SpO2 97% General: The  patient appears their stated age. HEENT:  No gross abnormalities Pulmonary:  Non labored breathing Musculoskeletal: There are no major deformities. Neurologic: No focal weakness or paresthesias are detected, Skin: There are no ulcer or rashes noted. Psychiatric: The patient has normal affect. Cardiovascular: There is a regular rate and rhythm without significant murmur appreciated.   Diagnostic Studies Ultrasound was ordered and reviewed.  This shows elevated velocities within the distal limb of the graft measuring 693.  ABI on the right is 1.0 with triphasic waveforms.  On the left is 0.79 which is up from 0.65 on 04/09/2014.  Assessment: Atherosclerosis with bilateral claudication, left greater than right Plan: I discussed the ultrasound findings with the patient.  He is approximately 3 status post percutaneous intervention.  His symptoms are slightly improved.  I have recommended repeat ultrasound evaluation in 3 months.  If the velocity profile increases along with a decrease in his ankle brachial index, he is going to need surgical repair with interposition graft.  Eldridge Abrahams, M.D. [facility] Office: [phone] Pager:  [phone]

## 2014-09-18 ENCOUNTER — Encounter (HOSPITAL_COMMUNITY): Payer: Self-pay | Admitting: Surgery

## 2014-11-21 ENCOUNTER — Encounter: Payer: Self-pay | Admitting: Surgery

## 2014-11-24 ENCOUNTER — Ambulatory Visit (HOSPITAL_COMMUNITY)
Admission: RE | Admit: 2014-11-24 | Discharge: 2014-11-24 | Disposition: A | Discharge status: Home / Self Care | Payer: Medicare Other | Source: Ambulatory Visit | Attending: Surgery | Admitting: Surgery

## 2014-11-24 ENCOUNTER — Ambulatory Visit (INDEPENDENT_AMBULATORY_CARE_PROVIDER_SITE_OTHER): Payer: Medicare Other | Admitting: Surgery

## 2014-11-24 ENCOUNTER — Ambulatory Visit (INDEPENDENT_AMBULATORY_CARE_PROVIDER_SITE_OTHER)
Admission: RE | Admit: 2014-11-24 | Discharge: 2014-11-24 | Disposition: A | Discharge status: Home / Self Care | Payer: Medicare Other | Source: Ambulatory Visit | Attending: Surgery | Admitting: Surgery

## 2014-11-24 ENCOUNTER — Encounter: Payer: Self-pay | Admitting: Surgery

## 2014-11-24 VITALS — BP 112/48 | HR 56 | Ht 67.0 in | Wt 149.9 lb

## 2014-11-24 DIAGNOSIS — Z48812 Encounter for surgical aftercare following surgery on the circulatory system: Secondary | ICD-10-CM | POA: Diagnosis not present

## 2014-11-24 DIAGNOSIS — I70219 Atherosclerosis of native arteries of extremities with intermittent claudication, unspecified extremity: Secondary | ICD-10-CM

## 2014-11-24 NOTE — Addendum Note (Signed)
Addended by: Dorthula Rue L on: 11/24/2014 02:19 PM   Modules accepted: Orders

## 2014-11-24 NOTE — Progress Notes (Signed)
Patient name: Tyler [name] MRN: [id number] DOB: [birth year] Sex: male     Chief Complaint  Patient presents with  . Re-evaluation    3 month f/u     HISTORY OF PRESENT ILLNESS: The patient is back today for followup. He is a former patient of Dr. Amedeo Plenty. He is status post complex aortic reconstruction with an aortobifemoral bypass graft and bilateral renal artery bypass graft. He is also status post right femoral-popliteal bypass graft in 1995 and 2003. I performed a right carotid endarterectomy for asymptomatic stenosis. 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 He underwent angiography on 04/15/2014 to better define this. I found approximately 80% stenosis within the distal limb of the aortobifemoral graft.  On 05/27/2014, he underwent drug coated balloon angioplasty of what appeared to be a stenosis within the limb of his aortobifemoral graft.  There was a residual stenosis after angioplasty.The patient does have a stable claudication symptoms.  He reports no changes since our last visit.  Past Medical History  Diagnosis Date  . Hypertension   . Myocardial infarction 2009  . CHF (congestive heart failure)   . Hyperlipidemia   . Carotid artery occlusion   . Ulcer   . Cancer     kidney  . Peripheral vascular disease   . COPD (chronic obstructive pulmonary disease)   . Coronary artery disease   . DVT (deep venous thrombosis)     Past Surgical History  Procedure Laterality Date  . Femoral-popliteal bypass graft  1995 and 2003  . Carotid endarterectomy  07/17/2009    right  . Pr vein bypass graft,aorto-fem-pop    . Aortic arch repair  1995  . Kidney surgery      resection of right renal mass  . Abdominal aortagram N/A 04/15/2014    Procedure: ABDOMINAL Maxcine Ham;  Surgeon: Serafina Mitchell, MD;  Location: [facility] C[facility] LAB;  Service: Cardiovascular;  Laterality: N/A;    History   Social History  . Marital Status: Married    Spouse Name: N/A  .  Number of Children: N/A  . Years of Education: N/A   Occupational History  . Not on file.   Social History Main Topics  . Smoking status: Former Smoker -- 1.00 packs/day for 40 years    Types: Cigarettes    Quit date: 10/11/1999  . Smokeless tobacco: Never Used  . Alcohol Use: No  . Drug Use: No  . Sexual Activity: Not on file   Other Topics Concern  . Not on file   Social History Narrative    Family History  Problem Relation Age of Onset  . Hypertension Other   . Coronary artery disease Other   . Alzheimer's disease Other   . Other Other     AAA  . Arthritis Other   . Diabetes Other   . Seizures Other   . Heart disease Father     Aneurysm, Abdominal Aortic    Allergies as of 11/24/2014 - Review Complete 11/24/2014  Allergen Reaction Noted  . Penicillins Swelling and Rash 09/07/2011    Current Outpatient Prescriptions on File Prior to Visit  Medication Sig Dispense Refill  . aspirin EC 81 MG tablet Take 81 mg by mouth daily.      . carvedilol (COREG) 6.25 MG tablet Take 6.25 mg by mouth 2 (two) times daily with a meal.      . dicyclomine (BENTYL) 10 MG capsule Take 10 mg by mouth 4 (four)  times daily -  before meals and at bedtime.    Marland Kitchen doxazosin (CARDURA) 4 MG tablet Take 4 mg by mouth at bedtime.      . Multiple Vitamin (MULTIVITAMIN) capsule Take 1 capsule by mouth daily.      Marland Kitchen NIFEdipine (PROCARDIA XL/ADALAT-CC) 60 MG 24 hr tablet Take 60 mg by mouth 2 (two) times daily.      . nitroGLYCERIN (NITROSTAT) 0.4 MG SL tablet Place 0.4 mg under the tongue every 5 (five) minutes as needed for chest pain.     Marland Kitchen omeprazole (PRILOSEC) 20 MG capsule Take 20 mg by mouth daily.      . simvastatin (ZOCOR) 20 MG tablet Take 20 mg by mouth at bedtime.       No current facility-administered medications on file prior to visit.     REVIEW OF SYSTEMS: Cardiovascular: No chest pain, chest pressure, palpitations, orthopnea, or dyspnea on exertion. No claudication or rest pain,   No history of DVT or phlebitis. Pulmonary: No productive cough, asthma or wheezing. Neurologic: No weakness, paresthesias, aphasia, or amaurosis. No dizziness. Hematologic: No bleeding problems or clotting disorders. Musculoskeletal: No joint pain or joint swelling. Gastrointestinal: No blood in stool or hematemesis Genitourinary: Increase in urination at night.  Scheduled to see a urologist soon. Psychiatric:: No history of major depression. Integumentary: No rashes or ulcers. Constitutional: No fever or chills.   PHYSICAL EXAMINATION:   Vital signs are BP 112/48 mmHg  Pulse 56  Ht 5\' 7"  (1.702 m)  Wt 149 lb 14.4 oz (67.994 kg)  BMI 23.47 kg/m2  SpO2 97% General: The patient appears their stated age. HEENT:  No gross abnormalities Pulmonary:  Non labored breathing Musculoskeletal: There are no major deformities. Neurologic: No focal weakness or paresthesias are detected, Skin: There are no ulcer or rashes noted. Psychiatric: The patient has normal affect. Cardiovascular: There is a regular rate and rhythm without significant murmur appreciated.   Diagnostic Studies Ultrasound was reviewed by myself today.  The velocity profile is stable in the left groin with peak velocity of 691.  ABIs today are 0.66 on the left and 0.99 on the right  Assessment: Peripheral vascular disease with claudication Plan: The patient has not had any change in his symptoms since I last saw him.  His ultrasound findings today are essentially unchanged.  Therefore, I have elected to continue with surveillance.  Should his symptoms get worse or the ultrasound findings increase, he will need surgical revascularization, which I suspect we'll be replacing the limb of the aortobifemoral bypass within the groin as it comes under the inguinal ligament.  I have scheduled the patient for follow-up again in 6 months.  We did discuss the signs and symptoms of acute ischemia and to come to the emergency department  should those occur.  Eldridge Abrahams, M.D. [facility] [place] Office: [phone] Pager:  [phone]

## 2015-06-01 ENCOUNTER — Ambulatory Visit (HOSPITAL_COMMUNITY)
Admission: RE | Admit: 2015-06-01 | Discharge: 2015-06-01 | Disposition: A | Discharge status: Home / Self Care | Payer: Medicare Other | Source: Ambulatory Visit | Attending: Surgery | Admitting: Surgery

## 2015-06-01 ENCOUNTER — Ambulatory Visit: Payer: Medicare Other | Admitting: Surgery

## 2015-06-01 ENCOUNTER — Other Ambulatory Visit: Payer: Self-pay | Admitting: Family

## 2015-06-01 ENCOUNTER — Ambulatory Visit (INDEPENDENT_AMBULATORY_CARE_PROVIDER_SITE_OTHER)
Admission: RE | Admit: 2015-06-01 | Discharge: 2015-06-01 | Disposition: A | Discharge status: Home / Self Care | Payer: Medicare Other | Source: Ambulatory Visit | Attending: Surgery | Admitting: Surgery

## 2015-06-01 DIAGNOSIS — I70202 Unspecified atherosclerosis of native arteries of extremities, left leg: Secondary | ICD-10-CM | POA: Insufficient documentation

## 2015-06-01 DIAGNOSIS — Z48812 Encounter for surgical aftercare following surgery on the circulatory system: Secondary | ICD-10-CM

## 2015-06-01 DIAGNOSIS — I739 Peripheral vascular disease, unspecified: Secondary | ICD-10-CM | POA: Diagnosis present

## 2015-06-01 DIAGNOSIS — I70219 Atherosclerosis of native arteries of extremities with intermittent claudication, unspecified extremity: Secondary | ICD-10-CM

## 2015-06-05 ENCOUNTER — Encounter: Payer: Self-pay | Admitting: Surgery

## 2015-06-08 ENCOUNTER — Ambulatory Visit (INDEPENDENT_AMBULATORY_CARE_PROVIDER_SITE_OTHER): Payer: Medicare Other | Admitting: Surgery

## 2015-06-08 ENCOUNTER — Encounter: Payer: Self-pay | Admitting: Surgery

## 2015-06-08 VITALS — BP 130/53 | HR 57 | Temp 97.6°F | Resp 17 | Ht 67.0 in | Wt 156.5 lb

## 2015-06-08 DIAGNOSIS — I70219 Atherosclerosis of native arteries of extremities with intermittent claudication, unspecified extremity: Secondary | ICD-10-CM | POA: Diagnosis not present

## 2015-06-08 DIAGNOSIS — Z9889 Other specified postprocedural states: Secondary | ICD-10-CM

## 2015-06-08 NOTE — Addendum Note (Signed)
Addended by: Dorthula Rue L on: 06/08/2015 04:06 PM   Modules accepted: Orders

## 2015-06-08 NOTE — Progress Notes (Signed)
Patient name: Tyler [name] MRN: [id number] DOB: 09/02/1929 Sex: male     Chief Complaint  Patient presents with  . Re-evaluation    6 month follow up, had studies done 06/01/2015. Patient states that overall he is doing well.     HISTORY OF PRESENT ILLNESS: The patient is back today for followup. He is a former patient of Dr. Amedeo Plenty. He is status post complex aortic reconstruction with an aortobifemoral bypass graft and bilateral renal artery bypass graft. He is also status post right femoral-popliteal bypass graft in 1995 and 2003. I performed a right carotid endarterectomy for asymptomatic stenosis. 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 He underwent angiography on 04/15/2014 to better define this. I found approximately 80% stenosis within the distal limb of the aortobifemoral graft. On 05/27/2014, he underwent drug coated balloon angioplasty of what appeared to be a stenosis within the limb of his aortobifemoral graft. There was a residual stenosis after angioplasty.The patient does have a stable claudication symptoms.   The patient states that he gets pain in his back with activity which resolves with rest.  He does not get calf claudication.  He does not have any open wounds.  Past Medical History  Diagnosis Date  . Hypertension   . Myocardial infarction 2009  . CHF (congestive heart failure)   . Hyperlipidemia   . Carotid artery occlusion   . Ulcer   . Cancer     kidney  . Peripheral vascular disease   . COPD (chronic obstructive pulmonary disease)   . Coronary artery disease   . DVT (deep venous thrombosis)     Past Surgical History  Procedure Laterality Date  . Femoral-popliteal bypass graft  1995 and 2003  . Carotid endarterectomy  07/17/2009    right  . Pr vein bypass graft,aorto-fem-pop    . Aortic arch repair  1995  . Kidney surgery      resection of right renal mass  . Abdominal aortagram N/A 04/15/2014    Procedure:  ABDOMINAL Maxcine Ham;  Surgeon: Serafina Mitchell, MD;  Location[facility] CATH LAB;  Service: Cardiovascular;  Laterality: N/A;    Social History   Social History  . Marital Status: Married    Spouse Name: N/A  . Number of Children: 3  . Years of Education: N/A   Occupational History  . Not on file.   Social History Main Topics  . Smoking status: Former Smoker -- 1.00 packs/day for 40 years    Types: Cigarettes    Quit date: 10/11/1999  . Smokeless tobacco: Never Used  . Alcohol Use: No  . Drug Use: No  . Sexual Activity: Not on file   Other Topics Concern  . Not on file   Social History Narrative    Family History  Problem Relation Age of Onset  . Hypertension Other   . Coronary artery disease Other   . Alzheimer's disease Other   . Other Other     AAA  . Arthritis Other   . Diabetes Other   . Seizures Other   . Heart disease Father     Aneurysm, Abdominal Aortic    Allergies as of 06/08/2015 - Review Complete 06/08/2015  Allergen Reaction Noted  . Penicillins Swelling and Rash 09/07/2011    Current Outpatient Prescriptions on File Prior to Visit  Medication Sig Dispense Refill  . aspirin EC 81 MG tablet Take 81 mg by mouth daily.      Marland Kitchen  carvedilol (COREG) 6.25 MG tablet Take 6.25 mg by mouth 2 (two) times daily with a meal.      . dicyclomine (BENTYL) 10 MG capsule Take 10 mg by mouth 4 (four) times daily -  before meals and at bedtime.    Marland Kitchen doxazosin (CARDURA) 4 MG tablet Take 4 mg by mouth at bedtime.      . Multiple Vitamin (MULTIVITAMIN) capsule Take 1 capsule by mouth daily.      Marland Kitchen NIFEdipine (PROCARDIA XL/ADALAT-CC) 60 MG 24 hr tablet Take 60 mg by mouth 2 (two) times daily.      . nitroGLYCERIN (NITROSTAT) 0.4 MG SL tablet Place 0.4 mg under the tongue every 5 (five) minutes as needed for chest pain.     Marland Kitchen omeprazole (PRILOSEC) 20 MG capsule Take 20 mg by mouth daily.      . simvastatin (ZOCOR) 20 MG tablet Take 20 mg by mouth at bedtime.       No current  facility-administered medications on file prior to visit.     REVIEW OF SYSTEMS: See history of present illness for pertinent positives and negatives, otherwise no change from visit 6 months ago  PHYSICAL EXAMINATION:   Vital signs are  Filed Vitals:   06/08/15 0938  BP: 130/53  Pulse: 57  Temp: 97.6 F (36.4 C)  TempSrc: Oral  Resp: 17  Height: 5\' 7"  (1.702 m)  Weight: 156 lb 8 oz (70.988 kg)  SpO2: 100%   Body mass index is 24.51 kg/(m^2). General: The patient appears their stated age. HEENT:  No gross abnormalities Pulmonary:  Non labored breathing Musculoskeletal: There are no major deformities. Neurologic: No focal weakness or paresthesias are detected, Skin: There are no ulcer or rashes noted. Psychiatric: The patient has normal affect. Cardiovascular: There is a regular rate and rhythm without significant murmur appreciated.  No carotid bruits   Diagnostic Studies Duplex ultrasound was ordered and reviewed today.  ABIs on the right are 0.97 on the left they are 0.72 with monophasic waveforms.  Again noted is a elevated velocity of 621 at the left femoral anastomosis with monophasic waveforms below.  Assessment: #1.  Atherosclerosis with claudication #2  Carotid occlusive disease Plan: #1:  The patient would like to have his study repeated in 3 months to see if there has been any change.  I would consider repeating his arteriogram from a left brachial approach with intervention in the left groin, should his symptoms persist.  I discussed that this may help with his buttocks type claudication.  #2: The patient did not get his carotid duplex today.  He remains asymptomatic.  I will have this performed when he is back in 3 months.  Eldridge Abrahams, M.D. [facility] [place] Office: [phone] Pager:  [phone]

## 2015-09-08 ENCOUNTER — Other Ambulatory Visit: Payer: Self-pay | Admitting: Surgery

## 2015-09-08 ENCOUNTER — Ambulatory Visit (HOSPITAL_COMMUNITY)
Admission: RE | Admit: 2015-09-08 | Discharge: 2015-09-08 | Disposition: A | Discharge status: Home / Self Care | Payer: Medicare Other | Source: Ambulatory Visit | Attending: Surgery | Admitting: Surgery

## 2015-09-08 ENCOUNTER — Ambulatory Visit (INDEPENDENT_AMBULATORY_CARE_PROVIDER_SITE_OTHER)
Admission: RE | Admit: 2015-09-08 | Discharge: 2015-09-08 | Disposition: A | Discharge status: Home / Self Care | Payer: Medicare Other | Source: Ambulatory Visit | Attending: Surgery | Admitting: Surgery

## 2015-09-08 DIAGNOSIS — I6523 Occlusion and stenosis of bilateral carotid arteries: Secondary | ICD-10-CM | POA: Insufficient documentation

## 2015-09-08 DIAGNOSIS — I70219 Atherosclerosis of native arteries of extremities with intermittent claudication, unspecified extremity: Secondary | ICD-10-CM | POA: Diagnosis not present

## 2015-09-08 DIAGNOSIS — Z48812 Encounter for surgical aftercare following surgery on the circulatory system: Secondary | ICD-10-CM

## 2015-09-08 DIAGNOSIS — Z9889 Other specified postprocedural states: Secondary | ICD-10-CM

## 2015-09-08 DIAGNOSIS — I739 Peripheral vascular disease, unspecified: Secondary | ICD-10-CM

## 2015-09-08 DIAGNOSIS — I10 Essential (primary) hypertension: Secondary | ICD-10-CM | POA: Insufficient documentation

## 2015-09-08 DIAGNOSIS — E785 Hyperlipidemia, unspecified: Secondary | ICD-10-CM | POA: Diagnosis not present

## 2015-09-09 ENCOUNTER — Encounter: Payer: Self-pay | Admitting: Surgery

## 2015-09-14 ENCOUNTER — Other Ambulatory Visit: Payer: Self-pay

## 2015-09-14 ENCOUNTER — Ambulatory Visit (INDEPENDENT_AMBULATORY_CARE_PROVIDER_SITE_OTHER): Payer: Medicare Other | Admitting: Surgery

## 2015-09-14 ENCOUNTER — Encounter: Payer: Self-pay | Admitting: Surgery

## 2015-09-14 ENCOUNTER — Ambulatory Visit (HOSPITAL_COMMUNITY)
Admission: RE | Admit: 2015-09-14 | Discharge: 2015-09-14 | Disposition: A | Discharge status: Home / Self Care | Payer: Medicare Other | Source: Ambulatory Visit | Attending: Surgery | Admitting: Surgery

## 2015-09-14 VITALS — BP 133/56 | HR 57 | Temp 97.8°F | Resp 16 | Ht 67.0 in | Wt 157.0 lb

## 2015-09-14 DIAGNOSIS — I739 Peripheral vascular disease, unspecified: Secondary | ICD-10-CM

## 2015-09-14 DIAGNOSIS — I6523 Occlusion and stenosis of bilateral carotid arteries: Secondary | ICD-10-CM | POA: Diagnosis not present

## 2015-09-14 NOTE — Progress Notes (Signed)
Patient name: NICHOLOS HUAMAN MRN: [id number] DOB: [birth year] Sex: male     Chief Complaint  Patient presents with  . Re-evaluation    3 mo PVD f/u    HISTORY OF PRESENT ILLNESS: The patient is back today for followup. He is a former patient of Dr. Amedeo Plenty. He is status post complex aortic reconstruction with an aortobifemoral bypass graft and bilateral renal artery bypass graft. He is also status post right femoral-popliteal bypass graft in 1995 and 2003. I performed a right carotid endarterectomy for asymptomatic stenosis. 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 He underwent angiography on 04/15/2014 to better define this. I found approximately 80% stenosis within the distal limb of the aortobifemoral graft. On 05/27/2014, he underwent drug coated balloon angioplasty of what appeared to be a stenosis within the limb of his aortobifemoral graft. There was a residual stenosis after angioplasty.    The patient's last ultrasound showed a focal velocity elevation in the left groin, approximately 600.  His symptoms of claudication were relatively stable.  This affects him mostly As left buttock claudication. He states that his buttock claudication has gotten slightly worse   Past Medical History  Diagnosis Date  . Hypertension   . Myocardial infarction ([place]) 2009  . CHF (congestive heart failure) ([place])   . Hyperlipidemia   . Carotid artery occlusion   . Ulcer   . Cancer ([place])     kidney  . Peripheral vascular disease ([place])   . COPD (chronic obstructive pulmonary disease) ([place])   . Coronary artery disease   . DVT (deep venous thrombosis) [facility])     Past Surgical History  Procedure Laterality Date  . Femoral-popliteal bypass graft  1995 and 2003  . Carotid endarterectomy  07/17/2009    right  . Pr vein bypass graft,aorto-fem-pop    . Aortic arch repair  1995  . Kidney surgery      resection of right renal mass  . Abdominal aortagram N/A 04/15/2014   Procedure: ABDOMINAL Maxcine Ham;  Surgeon: Serafina Mitchell, MD;  Location: [facility] CATH LAB;  Service: Cardiovascular;  Laterality: N/A;    Social History   Social History  . Marital Status: Married    Spouse Name: N/A  . Number of Children: 3  . Years of Education: N/A   Occupational History  . Not on file.   Social History Main Topics  . Smoking status: Former Smoker -- 1.00 packs/day for 40 years    Types: Cigarettes    Quit date: 10/11/1999  . Smokeless tobacco: Never Used  . Alcohol Use: No  . Drug Use: No  . Sexual Activity: Not on file   Other Topics Concern  . Not on file   Social History Narrative    Family History  Problem Relation Age of Onset  . Hypertension Other   . Coronary artery disease Other   . Alzheimer's disease Other   . Other Other     AAA  . Arthritis Other   . Diabetes Other   . Seizures Other   . Heart disease Father     Aneurysm, Abdominal Aortic    Allergies as of 09/14/2015 - Review Complete 09/14/2015  Allergen Reaction Noted  . Penicillins Swelling and Rash 09/07/2011    Current Outpatient Prescriptions on File Prior to Visit  Medication Sig Dispense Refill  . aspirin EC 81 MG tablet Take 81 mg by mouth daily.      . carvedilol (COREG)  6.25 MG tablet Take 6.25 mg by mouth 2 (two) times daily with a meal.      . dicyclomine (BENTYL) 10 MG capsule Take 10 mg by mouth 4 (four) times daily -  before meals and at bedtime.    Marland Kitchen doxazosin (CARDURA) 4 MG tablet Take 4 mg by mouth at bedtime.      . furosemide (LASIX) 40 MG tablet Take 40 mg by mouth every other day.    . Multiple Vitamin (MULTIVITAMIN) capsule Take 1 capsule by mouth daily.      Marland Kitchen NIFEdipine (PROCARDIA XL/ADALAT-CC) 60 MG 24 hr tablet Take 60 mg by mouth 2 (two) times daily.      . nitroGLYCERIN (NITROSTAT) 0.4 MG SL tablet Place 0.4 mg under the tongue every 5 (five) minutes as needed for chest pain.     Marland Kitchen omeprazole (PRILOSEC) 20 MG capsule Take 20 mg by mouth daily.        . simvastatin (ZOCOR) 20 MG tablet Take 20 mg by mouth at bedtime.       No current facility-administered medications on file prior to visit.     REVIEW OF SYSTEMS:  see above otherwise negative  PHYSICAL EXAMINATION:   Vital signs are  Filed Vitals:   09/14/15 1028  BP: 133/56  Pulse: 57  Temp: 97.8 F (36.6 C)  TempSrc: Oral  Resp: 16  Height: 5\' 7"  (1.702 m)  Weight: 157 lb (71.215 kg)  SpO2: 100%   Body mass index is 24.58 kg/(m^2). General: The patient appears their stated age. HEENT:  No gross abnormalities Pulmonary:  Non labored breathing Abdomen: Soft and non-tender Musculoskeletal: There are no major deformities. Neurologic: No focal weakness or paresthesias are detected, Skin: There are no ulcer or rashes noted. Psychiatric: The patient has normal affect. Cardiovascular: There is a regular rate and rhythm without significant murmur appreciated. Palpable femoral pulse   Diagnostic Studies  duplex ultrasound shows that the ankle-brachial index has decreased on the left, previously it was 0.7 to.  Currently it is 0.66.  The velocity profile has increased to 80 2 cm/s in the left groin.   On the right side, the bypass graft is widely patent.  ABI 0 point 91.  No significant carotid stenosis was identified on carotid duplex.  Assessment:  bypass graft stenosis, left leg Plan:  the velocity elevation has increased in the left groin. In addition, the patient appears to have slightly more pronounced buttock claudication symptoms. I think the next of this to proceed with angiography.  This will need to be done through a left brachial approach in order to intervene in the left groin. After reviewing her ultrasound images, I feel that the area of stenosis is contained within a graft.  Therefore I think it is reasonable to try to be more aggressive next time , however the patient did have approximately a 1-1/2 year success rate.  This procedure has been scheduled for  Tuesday, January 2  V. Leia Alf, M.D. [facility] [place] Office: [phone] Pager:  [phone]

## 2015-10-13 ENCOUNTER — Ambulatory Visit (HOSPITAL_COMMUNITY)
Admission: RE | Admit: 2015-10-13 | Discharge: 2015-10-13 | Disposition: A | Discharge status: Home / Self Care | Payer: Medicare Other | Source: Ambulatory Visit | Attending: Surgery | Admitting: Surgery

## 2015-10-13 ENCOUNTER — Other Ambulatory Visit: Payer: Self-pay | Admitting: *Deleted

## 2015-10-13 ENCOUNTER — Encounter (HOSPITAL_COMMUNITY): Payer: Self-pay | Admitting: Surgery

## 2015-10-13 ENCOUNTER — Encounter (HOSPITAL_COMMUNITY)
Admission: RE | Disposition: A | Discharge status: Home / Self Care | Payer: Self-pay | Source: Ambulatory Visit | Attending: Surgery

## 2015-10-13 DIAGNOSIS — J449 Chronic obstructive pulmonary disease, unspecified: Secondary | ICD-10-CM | POA: Diagnosis not present

## 2015-10-13 DIAGNOSIS — Z87891 Personal history of nicotine dependence: Secondary | ICD-10-CM | POA: Insufficient documentation

## 2015-10-13 DIAGNOSIS — Z48812 Encounter for surgical aftercare following surgery on the circulatory system: Secondary | ICD-10-CM

## 2015-10-13 DIAGNOSIS — I2510 Atherosclerotic heart disease of native coronary artery without angina pectoris: Secondary | ICD-10-CM | POA: Diagnosis not present

## 2015-10-13 DIAGNOSIS — I110 Hypertensive heart disease with heart failure: Secondary | ICD-10-CM | POA: Diagnosis not present

## 2015-10-13 DIAGNOSIS — Z7982 Long term (current) use of aspirin: Secondary | ICD-10-CM | POA: Diagnosis not present

## 2015-10-13 DIAGNOSIS — Z880 Allergy status to penicillin: Secondary | ICD-10-CM | POA: Insufficient documentation

## 2015-10-13 DIAGNOSIS — E785 Hyperlipidemia, unspecified: Secondary | ICD-10-CM | POA: Diagnosis not present

## 2015-10-13 DIAGNOSIS — I6529 Occlusion and stenosis of unspecified carotid artery: Secondary | ICD-10-CM | POA: Insufficient documentation

## 2015-10-13 DIAGNOSIS — Z86718 Personal history of other venous thrombosis and embolism: Secondary | ICD-10-CM | POA: Insufficient documentation

## 2015-10-13 DIAGNOSIS — I252 Old myocardial infarction: Secondary | ICD-10-CM | POA: Diagnosis not present

## 2015-10-13 DIAGNOSIS — I739 Peripheral vascular disease, unspecified: Secondary | ICD-10-CM

## 2015-10-13 DIAGNOSIS — I509 Heart failure, unspecified: Secondary | ICD-10-CM | POA: Insufficient documentation

## 2015-10-13 DIAGNOSIS — I70302 Unspecified atherosclerosis of unspecified type of bypass graft(s) of the extremities, left leg: Secondary | ICD-10-CM | POA: Diagnosis present

## 2015-10-13 DIAGNOSIS — I70312 Atherosclerosis of unspecified type of bypass graft(s) of the extremities with intermittent claudication, left leg: Secondary | ICD-10-CM | POA: Diagnosis not present

## 2015-10-13 HISTORY — PX: PERIPHERAL VASCULAR CATHETERIZATION: SHX172C

## 2015-10-13 LAB — POCT ACTIVATED CLOTTING TIME
ACTIVATED CLOTTING TIME: 193 s
ACTIVATED CLOTTING TIME: 173 s
Activated Clotting Time: 219 seconds

## 2015-10-13 LAB — POCT I-STAT, CHEM 8
BUN: 17 mg/dL (ref 6–20)
CALCIUM ION: 1.25 mmol/L (ref 1.13–1.30)
Chloride: 110 mmol/L (ref 101–111)
Creatinine, Ser: 1.3 mg/dL — ABNORMAL HIGH (ref 0.61–1.24)
GLUCOSE: 120 mg/dL — AB (ref 65–99)
HCT: 46 % (ref 39.0–52.0)
HEMOGLOBIN: 15.6 g/dL (ref 13.0–17.0)
Potassium: 3.9 mmol/L (ref 3.5–5.1)
SODIUM: 144 mmol/L (ref 135–145)
TCO2: 23 mmol/L (ref 0–100)

## 2015-10-13 SURGERY — ABDOMINAL AORTOGRAM
Anesthesia: LOCAL

## 2015-10-13 MED ORDER — NITROGLYCERIN 1 MG/10 ML FOR IR/CATH LAB
INTRA_ARTERIAL | Status: AC
  Filled 2015-10-13: qty 10

## 2015-10-13 MED ORDER — ALUM & MAG HYDROXIDE-SIMETH 200-200-20 MG/5ML PO SUSP: 15.0000 mL | ORAL | Status: DC | PRN

## 2015-10-13 MED ORDER — NITROGLYCERIN 1 MG/10 ML FOR IR/CATH LAB: INTRA_ARTERIAL | Status: DC | PRN

## 2015-10-13 MED ORDER — GUAIFENESIN-DM 100-10 MG/5ML PO SYRP: 15.0000 mL | ORAL_SOLUTION | ORAL | Status: DC | PRN

## 2015-10-13 MED ORDER — HEPARIN (PORCINE) IN NACL 2-0.9 UNIT/ML-% IJ SOLN
INTRAMUSCULAR | Status: AC
  Filled 2015-10-13: qty 1000

## 2015-10-13 MED ORDER — LIDOCAINE HCL (PF) 1 % IJ SOLN
INTRAMUSCULAR | Status: AC
  Filled 2015-10-13: qty 30

## 2015-10-13 MED ORDER — SODIUM CHLORIDE 0.9 % IV SOLN
INTRAVENOUS | Status: DC
  Administered 2015-10-13: 06:00:00 via INTRAVENOUS

## 2015-10-13 MED ORDER — HEPARIN SODIUM (PORCINE) 1000 UNIT/ML IJ SOLN
INTRAMUSCULAR | Status: AC
  Filled 2015-10-13: qty 1

## 2015-10-13 MED ORDER — ACETAMINOPHEN 325 MG PO TABS: 325.0000 mg | ORAL_TABLET | ORAL | Status: DC | PRN

## 2015-10-13 MED ORDER — METOPROLOL TARTRATE 1 MG/ML IV SOLN: 2.0000 mg | INTRAVENOUS | Status: DC | PRN

## 2015-10-13 MED ORDER — OXYCODONE HCL 5 MG PO TABS: 5.0000 mg | ORAL_TABLET | ORAL | Status: DC | PRN

## 2015-10-13 MED ORDER — ACETAMINOPHEN 325 MG RE SUPP: 325.0000 mg | RECTAL | Status: DC | PRN

## 2015-10-13 MED ORDER — HYDRALAZINE HCL 20 MG/ML IJ SOLN: 5.0000 mg | INTRAMUSCULAR | Status: DC | PRN

## 2015-10-13 MED ORDER — PHENOL 1.4 % MT LIQD: 1.0000 | OROMUCOSAL | Status: DC | PRN

## 2015-10-13 MED ORDER — SODIUM CHLORIDE 0.9 % IV SOLN: 1.0000 mL/kg/h | INTRAVENOUS | Status: DC

## 2015-10-13 MED ORDER — HEPARIN SODIUM (PORCINE) 1000 UNIT/ML IJ SOLN
INTRAMUSCULAR | Status: DC | PRN
  Administered 2015-10-13: 5000 [IU] via INTRAVENOUS
  Administered 2015-10-13: 2000 [IU] via INTRAVENOUS

## 2015-10-13 MED ORDER — IODIXANOL 320 MG/ML IV SOLN
INTRAVENOUS | Status: DC | PRN
  Administered 2015-10-13: 140 mL via INTRA_ARTERIAL

## 2015-10-13 MED ORDER — NITROGLYCERIN 1 MG/10 ML FOR IR/CATH LAB
INTRA_ARTERIAL | Status: DC | PRN
  Administered 2015-10-13: 08:00:00

## 2015-10-13 MED ORDER — MORPHINE SULFATE (PF) 10 MG/ML IV SOLN: 2.0000 mg | INTRAVENOUS | Status: DC | PRN

## 2015-10-13 MED ORDER — DOCUSATE SODIUM 100 MG PO CAPS: 100.0000 mg | ORAL_CAPSULE | Freq: Every day | ORAL | Status: DC

## 2015-10-13 MED ORDER — ONDANSETRON HCL 4 MG/2ML IJ SOLN: 4.0000 mg | Freq: Four times a day (QID) | INTRAMUSCULAR | Status: DC | PRN

## 2015-10-13 MED ORDER — LABETALOL HCL 5 MG/ML IV SOLN: 10.0000 mg | INTRAVENOUS | Status: DC | PRN

## 2015-10-13 SURGICAL SUPPLY — 20 items
BALLN MUSTANG 8X20X135 (BALLOONS) ×3
BALLOON MUSTANG 7X40X135 (BALLOONS) ×1 IMPLANT
BALLOON MUSTANG 8X20X135 (BALLOONS) IMPLANT
CATH ANGIO 5F PIGTAIL 100CM (CATHETERS) ×1 IMPLANT
COVER PRB 48X5XTLSCP FOLD TPE (BAG) IMPLANT
COVER PROBE 5X48 (BAG) ×3
DEVICE CONTINUOUS FLUSH (MISCELLANEOUS) ×1 IMPLANT
KIT ENCORE 26 ADVANTAGE (KITS) ×1 IMPLANT
KIT MICROINTRODUCER STIFF 5F (SHEATH) ×1 IMPLANT
KIT PV (KITS) ×3 IMPLANT
SHEATH DESTINATION MP 6FR 90CM (SHEATH) ×1 IMPLANT
SHEATH PINNACLE 6F 10CM (SHEATH) ×1 IMPLANT
SHIELD RADPAD SCOOP 12X17 (MISCELLANEOUS) ×1 IMPLANT
STOPCOCK MORSE 400PSI 3WAY (MISCELLANEOUS) ×1 IMPLANT
SYR MEDRAD MARK V 150ML (SYRINGE) ×3 IMPLANT
TRANSDUCER W/STOPCOCK (MISCELLANEOUS) ×3 IMPLANT
TRAY PV CATH (CUSTOM PROCEDURE TRAY) ×3 IMPLANT
TUBING CONTRAST HIGH PRESS 20 (MISCELLANEOUS) ×1 IMPLANT
WIRE BENTSON .035X145CM (WIRE) ×1 IMPLANT
WIRE HI TORQ VERSACORE J 260CM (WIRE) ×1 IMPLANT

## 2015-10-13 NOTE — Interval H&P Note (Signed)
History and Physical Interval Note:  10/13/2015 7:40 AM  Tyler [name]  has presented today for surgery, with the diagnosis of LLE bypass graft stenosis  The various methods of treatment have been discussed with the patient and family. After consideration of risks, benefits and other options for treatment, the patient has consented to  Procedure(s): Abdominal Aortogram (N/A) as a surgical intervention .  The patient's history has been reviewed, patient examined, no change in status, stable for surgery.  I have reviewed the patient's chart and labs.  Questions were answered to the patient's satisfaction.     Annamarie Major

## 2015-10-13 NOTE — Discharge Instructions (Signed)

## 2015-10-13 NOTE — Op Note (Signed)
Patient name: Tyler [name] MRN: [id number] DOB: 1929-03-28 Sex: male  10/13/2015 Pre-operative Diagnosis: Bypass Graft Stenosis Post-operative diagnosis:  Same Surgeon:  Annamarie Major Procedure Performed:  1.  Ultrasound guided access left brachial artery  2.  Abdominal aortogram with bilateral runoff  3.  Angioplasty, Left limb of aorto-bifemoral bypass graft  4.  F/u PTA  5.  Intra-arterial administration of nitroglycerin    Indications:  The patient has previously undergone an aortobifemoral bypass graft.  He has developed stenosis within the left limb of the graft which was recently angioplasty 1.5 years ago.  He has had recurrence of his symptoms.  He comes in for further evaluation and possible treatment.  I have been reluctant to take him to the operating room given his age and comorbidities.  Procedure:  The patient was identified in the holding area and taken to room 8.  The patient was then placed supine on the table and prepped and draped in the usual sterile fashion.  A time out was called.  Ultrasound was used to evaluate the left brachial artery.  A digital ultrasound image was acquired.  The artery was widely patent.  One percent lidocaine was used for local anesthesia.  Under ultrasound guidance, a micropuncture needle was used to cannulate the left brachial artery.  An 018 wire was advanced without resistance and a micropuncture sheath was placed.  A 035 wire was then inserted followed by a 6 French sheath.  Using a pigtail catheter, the wire was directed into the abdominal aorta.  A 6 French 90 cm sheath was then inserted down to the distal abdominal aorta.  A pigtail catheter was then placed at the aortic bifurcation and a distal abdominal aortogram with bifemoral runoff was performed.  Next, the sheath was advanced into the left limb of the graft, and selected imaging of the left leg was performed  Findings:   Aortogram:  The distal aorta graft is widely patent as is the  bifurcation.  No significant stenosis identified within the right limb of the graft.  Within the distal left limb of the graft there was approximately a 85% stenosis just above the anastomosis.  Right Lower Extremity:  The right femoral anastomosis is widely patent  Left Lower Extremity:  Just proximal to the left femoral anastomosis, likely within the bypass graft there is a 85% stenosis.  Calcified superficial femoral artery is identified with several areas of moderate stenosis.  There is three-vessel runoff however diseased  Intervention:  At this point, the patient was fully heparinized.  An 035 wire was advanced across the lesion in the superficial femoral artery.  I initially perform balloon angioplasty using a 7 x 40 Mustang balloon within the left limb of the bypass graft.  This was taken to 24 atm for 90 seconds.  Follow-up imaging revealed significantly improved results with stenosis approximately 40%.  I then inserted a 8 x 20 balloon and perform balloon angioplasty taking the balloon to 24 atm for 90 seconds.  This time imaging revealed a 25-30 percent residual stenosis.  I felt at this point there was significant improvement in the appearance of the lesion.  I did not want to place a stent.  This was the appearance of the lesion approximately 1-1/2 years ago after intervention and the patient had adequate results and therefore I elected to leave this lesion.  Catheters and wires were removed.  The long sheath was exchanged out for a short 6 [place] sheath and the  patient will be taken to the holding area for sheath pull once his coag Profile corrects  Impression:  #1  successful angioplasty of the bypass graft stenosis in the left femoral limb.  Initial stenosis was 85%.  Balloon angioplasty with a 7 x 40 and a 8 x 20 balloon were performed with residual final stenosis of less than 30%.   Theotis Burrow, M.D. [facility] [place] Office: [phone] Pager:   [phone]

## 2015-10-13 NOTE — Progress Notes (Signed)
Site area: left brachial a 6 french arterial sheath was removed  Site Prior to Removal:  Level 0  Pressure Applied For 30 MINUTES    Minutes Beginning at 1035am  Manual:   Yes.    Patient Status During Pull:  stable  Post Pull Groin Site:  Level 0  Post Pull Instructions Given:  Yes.    Post Pull Pulses Present:  Yes.    Dressing Applied:  Yes.    Comments:  VS remain stable during sheath pull

## 2015-10-13 NOTE — H&P (View-Only) (Signed)
Patient name: ABDURREHMAN [name] MRN: [id number] DOB: 10-18-1928 Sex: male     Chief Complaint  Patient presents with  . Re-evaluation    3 mo PVD f/u    HISTORY OF PRESENT ILLNESS: The patient is back today for followup. He is a former patient of Dr. Amedeo Plenty. He is status post complex aortic reconstruction with an aortobifemoral bypass graft and bilateral renal artery bypass graft. He is also status post right femoral-popliteal bypass graft in 1995 and 2003. I performed a right carotid endarterectomy for asymptomatic stenosis. 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 He underwent angiography on 04/15/2014 to better define this. I found approximately 80% stenosis within the distal limb of the aortobifemoral graft. On 05/27/2014, he underwent drug coated balloon angioplasty of what appeared to be a stenosis within the limb of his aortobifemoral graft. There was a residual stenosis after angioplasty.    The patient's last ultrasound showed a focal velocity elevation in the left groin, approximately 600.  His symptoms of claudication were relatively stable.  This affects him mostly As left buttock claudication. He states that his buttock claudication has gotten slightly worse   Past Medical History  Diagnosis Date  . Hypertension   . Myocardial infarction ([place]) 2009  . CHF (congestive heart failure) ([place])   . Hyperlipidemia   . Carotid artery occlusion   . Ulcer   . Cancer ([place])     kidney  . Peripheral vascular disease ([place])   . COPD (chronic obstructive pulmonary disease) ([place])   . Coronary artery disease   . DVT (deep venous thrombosis) [facility])     Past Surgical History  Procedure Laterality Date  . Femoral-popliteal bypass graft  1995 and 2003  . Carotid endarterectomy  07/17/2009    right  . Pr vein bypass graft,aorto-fem-pop    . Aortic arch repair  1995  . Kidney surgery      resection of right renal mass  . Abdominal aortagram N/A 04/15/2014   Procedure: ABDOMINAL Maxcine Ham;  Surgeon: Serafina Mitchell, MD;  Location: [facility] CATH LAB;  Service: Cardiovascular;  Laterality: N/A;    Social History   Social History  . Marital Status: Married    Spouse Name: N/A  . Number of Children: 3  . Years of Education: N/A   Occupational History  . Not on file.   Social History Main Topics  . Smoking status: Former Smoker -- 1.00 packs/day for 40 years    Types: Cigarettes    Quit date: 10/11/1999  . Smokeless tobacco: Never Used  . Alcohol Use: No  . Drug Use: No  . Sexual Activity: Not on file   Other Topics Concern  . Not on file   Social History Narrative    Family History  Problem Relation Age of Onset  . Hypertension Other   . Coronary artery disease Other   . Alzheimer's disease Other   . Other Other     AAA  . Arthritis Other   . Diabetes Other   . Seizures Other   . Heart disease Father     Aneurysm, Abdominal Aortic    Allergies as of 09/14/2015 - Review Complete 09/14/2015  Allergen Reaction Noted  . Penicillins Swelling and Rash 09/07/2011    Current Outpatient Prescriptions on File Prior to Visit  Medication Sig Dispense Refill  . aspirin EC 81 MG tablet Take 81 mg by mouth daily.      . carvedilol (COREG)  6.25 MG tablet Take 6.25 mg by mouth 2 (two) times daily with a meal.      . dicyclomine (BENTYL) 10 MG capsule Take 10 mg by mouth 4 (four) times daily -  before meals and at bedtime.    Marland Kitchen doxazosin (CARDURA) 4 MG tablet Take 4 mg by mouth at bedtime.      . furosemide (LASIX) 40 MG tablet Take 40 mg by mouth every other day.    . Multiple Vitamin (MULTIVITAMIN) capsule Take 1 capsule by mouth daily.      Marland Kitchen NIFEdipine (PROCARDIA XL/ADALAT-CC) 60 MG 24 hr tablet Take 60 mg by mouth 2 (two) times daily.      . nitroGLYCERIN (NITROSTAT) 0.4 MG SL tablet Place 0.4 mg under the tongue every 5 (five) minutes as needed for chest pain.     Marland Kitchen omeprazole (PRILOSEC) 20 MG capsule Take 20 mg by mouth daily.        . simvastatin (ZOCOR) 20 MG tablet Take 20 mg by mouth at bedtime.       No current facility-administered medications on file prior to visit.     REVIEW OF SYSTEMS:  see above otherwise negative  PHYSICAL EXAMINATION:   Vital signs are  Filed Vitals:   09/14/15 1028  BP: 133/56  Pulse: 57  Temp: 97.8 F (36.6 C)  TempSrc: Oral  Resp: 16  Height: 5\' 7"  (1.702 m)  Weight: 157 lb (71.215 kg)  SpO2: 100%   Body mass index is 24.58 kg/(m^2). General: The patient appears their stated age. HEENT:  No gross abnormalities Pulmonary:  Non labored breathing Abdomen: Soft and non-tender Musculoskeletal: There are no major deformities. Neurologic: No focal weakness or paresthesias are detected, Skin: There are no ulcer or rashes noted. Psychiatric: The patient has normal affect. Cardiovascular: There is a regular rate and rhythm without significant murmur appreciated. Palpable femoral pulse   Diagnostic Studies  duplex ultrasound shows that the ankle-brachial index has decreased on the left, previously it was 0.7 to.  Currently it is 0.66.  The velocity profile has increased to 80 2 cm/s in the left groin.   On the right side, the bypass graft is widely patent.  ABI 0 point 91.  No significant carotid stenosis was identified on carotid duplex.  Assessment:  bypass graft stenosis, left leg Plan:  the velocity elevation has increased in the left groin. In addition, the patient appears to have slightly more pronounced buttock claudication symptoms. I think the next of this to proceed with angiography.  This will need to be done through a left brachial approach in order to intervene in the left groin. After reviewing her ultrasound images, I feel that the area of stenosis is contained within a graft.  Therefore I think it is reasonable to try to be more aggressive next time , however the patient did have approximately a 1-1/2 year success rate.  This procedure has been scheduled for  Tuesday, January 2  V. Leia Alf, M.D. [facility] [place] Office: [phone] Pager:  [phone]

## 2015-10-16 ENCOUNTER — Telehealth: Payer: Self-pay | Admitting: Surgery

## 2015-10-16 ENCOUNTER — Telehealth: Payer: Self-pay | Admitting: *Deleted

## 2015-10-16 NOTE — Telephone Encounter (Signed)
Tyler [name] called to report that he has a few drops of clear fluid coming out of his left brachial access site (10-13-15 AGM via Left brachial access by Dr. Trula Slade). He states that his incision is clean and not open. He reports no erythema, no coolness or discoloration in right hand, no problem with grip strength or pain and he is afebrile. The drainage is very slight and is clear with no odor. I reassured patient and discussed cleansing and application of dry protective dressing. He voiced understanding and agreement of this plan. He will call us if he has any other issues.

## 2015-10-16 NOTE — Telephone Encounter (Signed)
-----   Message from Mena Goes, RN sent at 10/13/2015  4:59 PM EST ----- Regarding: schedule   ----- Message -----    From: Dario Ave    Sent: 10/13/2015  10:08 AM      To: Dario Ave, [facility] Charge Pool Subject: Zigmund Daniel                                              ----- Message -----    From: Serafina Mitchell, MD    Sent: 10/13/2015   8:45 AM      To: [facility] Charge Pool  10/13/2015:  Surgeon:  Annamarie Major Procedure Performed:  1.  Ultrasound guided access left brachial artery  2.  Abdominal aortogram with bilateral runoff  3.  Angioplasty, Left limb of aorto-bifemoral bypass graft  4.  F/u PTA  5.  Intra-arterial administration of nitroglycerin   Follow-up with Vinnie Level and a lower extremity duplex with ABI in 6 months

## 2016-04-15 ENCOUNTER — Encounter: Payer: Self-pay | Admitting: Family

## 2016-04-15 ENCOUNTER — Encounter (HOSPITAL_COMMUNITY): Payer: Medicare Other

## 2016-04-15 ENCOUNTER — Ambulatory Visit: Payer: Medicare Other | Admitting: Family

## 2016-04-19 ENCOUNTER — Other Ambulatory Visit: Payer: Self-pay | Admitting: *Deleted

## 2016-04-19 DIAGNOSIS — I739 Peripheral vascular disease, unspecified: Secondary | ICD-10-CM

## 2016-04-20 ENCOUNTER — Ambulatory Visit (INDEPENDENT_AMBULATORY_CARE_PROVIDER_SITE_OTHER): Payer: Medicare Other | Admitting: Family

## 2016-04-20 ENCOUNTER — Encounter: Payer: Self-pay | Admitting: Family

## 2016-04-20 ENCOUNTER — Ambulatory Visit (HOSPITAL_COMMUNITY)
Admission: RE | Admit: 2016-04-20 | Discharge: 2016-04-20 | Disposition: A | Discharge status: Home / Self Care | Payer: Medicare Other | Source: Ambulatory Visit | Attending: Family | Admitting: Family

## 2016-04-20 ENCOUNTER — Ambulatory Visit (INDEPENDENT_AMBULATORY_CARE_PROVIDER_SITE_OTHER)
Admission: RE | Admit: 2016-04-20 | Discharge: 2016-04-20 | Disposition: A | Discharge status: Home / Self Care | Payer: Medicare Other | Source: Ambulatory Visit | Attending: Family | Admitting: Family

## 2016-04-20 VITALS — BP 112/58 | HR 50 | Temp 96.8°F | Resp 16 | Ht 67.0 in | Wt 154.0 lb

## 2016-04-20 DIAGNOSIS — Z48812 Encounter for surgical aftercare following surgery on the circulatory system: Secondary | ICD-10-CM | POA: Insufficient documentation

## 2016-04-20 DIAGNOSIS — I739 Peripheral vascular disease, unspecified: Secondary | ICD-10-CM

## 2016-04-20 DIAGNOSIS — I70301 Unspecified atherosclerosis of unspecified type of bypass graft(s) of the extremities, right leg: Secondary | ICD-10-CM | POA: Insufficient documentation

## 2016-04-20 DIAGNOSIS — Z9889 Other specified postprocedural states: Secondary | ICD-10-CM | POA: Diagnosis not present

## 2016-04-20 DIAGNOSIS — E785 Hyperlipidemia, unspecified: Secondary | ICD-10-CM | POA: Insufficient documentation

## 2016-04-20 DIAGNOSIS — Z87891 Personal history of nicotine dependence: Secondary | ICD-10-CM

## 2016-04-20 DIAGNOSIS — I110 Hypertensive heart disease with heart failure: Secondary | ICD-10-CM | POA: Diagnosis not present

## 2016-04-20 DIAGNOSIS — J449 Chronic obstructive pulmonary disease, unspecified: Secondary | ICD-10-CM | POA: Diagnosis not present

## 2016-04-20 DIAGNOSIS — I70202 Unspecified atherosclerosis of native arteries of extremities, left leg: Secondary | ICD-10-CM | POA: Diagnosis not present

## 2016-04-20 DIAGNOSIS — R938 Abnormal findings on diagnostic imaging of other specified body structures: Secondary | ICD-10-CM | POA: Diagnosis not present

## 2016-04-20 DIAGNOSIS — I2510 Atherosclerotic heart disease of native coronary artery without angina pectoris: Secondary | ICD-10-CM | POA: Insufficient documentation

## 2016-04-20 DIAGNOSIS — I509 Heart failure, unspecified: Secondary | ICD-10-CM | POA: Diagnosis not present

## 2016-04-20 DIAGNOSIS — I7409 Other arterial embolism and thrombosis of abdominal aorta: Secondary | ICD-10-CM

## 2016-04-20 DIAGNOSIS — I6521 Occlusion and stenosis of right carotid artery: Secondary | ICD-10-CM | POA: Diagnosis not present

## 2016-04-20 DIAGNOSIS — Z95828 Presence of other vascular implants and grafts: Secondary | ICD-10-CM

## 2016-04-20 DIAGNOSIS — I70213 Atherosclerosis of native arteries of extremities with intermittent claudication, bilateral legs: Secondary | ICD-10-CM | POA: Diagnosis not present

## 2016-04-20 DIAGNOSIS — R0989 Other specified symptoms and signs involving the circulatory and respiratory systems: Secondary | ICD-10-CM | POA: Diagnosis present

## 2016-04-20 NOTE — Progress Notes (Addendum)
[facility] [place] HISTORY AND PHYSICAL   MRN : [id number]  History of Present Illness:   Tyler [name] is a 82 y.o. male patient of Dr. Trula Slade who is s/p right CEA in 07/17/09 by Dr. Trula Slade, Aortobifemoral bypass grafting and bilateral renal artery bypass graft in1995, and right femoral to above knee popliteal artery bypass using vein graft in 1995 and 2003 by Dr. Amedeo Plenty.  He then had an angioplasty of the left limb of the aorto-bi-iliac bypass graft on 10/16/15. He returns today for LE arterial surveillance.  He has a tired feeling in his bilateral buttocks after walking about a hundred yards, relieved by rest, he denies claudication symptoms in thighs or calves; this has been getting worse since he was last here in December of 2016.    Patient denies non-healing wounds.  He denies any recent or remote history of stroke or TIA symptoms.   Pt states he has a history of kidney cancer with most of one of his kidneys resected for this. His last creatinine result on file was 1.4 in January 2017.  Pt Diabetic: No  Pt smoker: former smoker, quit in 2001 Pt meds include:  Statin :Yes  Betablocker: Yes  ASA: Yes  Other anticoagulants/antiplatelets: no     Current Outpatient Prescriptions  Medication Sig Dispense Refill  . aspirin EC 81 MG tablet Take 81 mg by mouth daily.      . carvedilol (COREG) 6.25 MG tablet Take 6.25 mg by mouth 2 (two) times daily with a meal.      . dicyclomine (BENTYL) 10 MG capsule Take 10 mg by mouth 4 (four) times daily -  before meals and at bedtime.    Marland Kitchen doxazosin (CARDURA) 4 MG tablet Take 4 mg by mouth at bedtime.      . furosemide (LASIX) 40 MG tablet Take 40 mg by mouth every other day.    . levothyroxine (SYNTHROID, LEVOTHROID) 25 MCG tablet Take 25 mcg by mouth daily before breakfast.    . Multiple Vitamin (MULTIVITAMIN) capsule Take 1 capsule by mouth daily.      Marland Kitchen NIFEdipine (PROCARDIA XL/ADALAT-CC) 60 MG 24 hr tablet  Take 60 mg by mouth 2 (two) times daily.      . nitroGLYCERIN (NITRODUR - DOSED IN MG/24 HR) 0.4 mg/hr patch Place 0.4 mg onto the skin daily.    Marland Kitchen omeprazole (PRILOSEC) 20 MG capsule Take 20 mg by mouth daily.      . simvastatin (ZOCOR) 20 MG tablet Take 20 mg by mouth at bedtime.       No current facility-administered medications for this visit.    Past Medical History  Diagnosis Date  . Hypertension   . Myocardial infarction ([place]) 2009  . CHF (congestive heart failure) ([place])   . Hyperlipidemia   . Carotid artery occlusion   . Ulcer   . Cancer ([place])     kidney  . Peripheral vascular disease ([place])   . COPD (chronic obstructive pulmonary disease) ([place])   . Coronary artery disease   . DVT (deep venous thrombosis) ([place])     Social History Social History  Substance Use Topics  . Smoking status: Former Smoker -- 1.00 packs/day for 40 years    Types: Cigarettes    Quit date: 10/11/1999  . Smokeless tobacco: Never Used  . Alcohol Use: No    Family History Family History  Problem Relation Age of Onset  . Hypertension Other   . Coronary artery disease  Other   . Alzheimer's disease Other   . Other Other     AAA  . Arthritis Other   . Diabetes Other   . Seizures Other   . Heart disease Father     Aneurysm, Abdominal Aortic    Surgical History Past Surgical History  Procedure Laterality Date  . Femoral-popliteal bypass graft  1995 and 2003  . Carotid endarterectomy  07/17/2009    right  . Pr vein bypass graft,aorto-fem-pop    . Aortic arch repair  1995  . Kidney surgery      resection of right renal mass  . Abdominal aortagram N/A 04/15/2014    Procedure: ABDOMINAL Maxcine Ham;  Surgeon: Serafina Mitchell, MD;  Location: [facility] CATH LAB;  Service: Cardiovascular;  Laterality: N/A;  . Peripheral vascular catheterization N/A 10/13/2015    Procedure: Abdominal Aortogram;  Surgeon: Serafina Mitchell, MD;  Location: [place] CV LAB;  Service: Cardiovascular;  Laterality: N/A;  .  Peripheral vascular catheterization Left 10/13/2015    Procedure: Peripheral Vascular Balloon Angioplasty;  Surgeon: Serafina Mitchell, MD;  Location: [place] CV LAB;  Service: Cardiovascular;  Laterality: Left;  Left femoral limb of  of AO BI femoral graft    Allergies  Allergen Reactions  . Penicillins Swelling and Rash    Has patient had a PCN reaction causing immediate rash, facial/tongue/throat swelling, SOB or lightheadedness with hypotension: Yes Has patient had a PCN reaction causing severe rash involving mucus membranes or skin necrosis: No Has patient had a PCN reaction that required hospitalization No Has patient had a PCN reaction occurring within the last 10 years: No If all of the above answers are "NO", then may proceed with Cephalosporin use.     Current Outpatient Prescriptions  Medication Sig Dispense Refill  . aspirin EC 81 MG tablet Take 81 mg by mouth daily.      . carvedilol (COREG) 6.25 MG tablet Take 6.25 mg by mouth 2 (two) times daily with a meal.      . dicyclomine (BENTYL) 10 MG capsule Take 10 mg by mouth 4 (four) times daily -  before meals and at bedtime.    Marland Kitchen doxazosin (CARDURA) 4 MG tablet Take 4 mg by mouth at bedtime.      . furosemide (LASIX) 40 MG tablet Take 40 mg by mouth every other day.    . levothyroxine (SYNTHROID, LEVOTHROID) 25 MCG tablet Take 25 mcg by mouth daily before breakfast.    . Multiple Vitamin (MULTIVITAMIN) capsule Take 1 capsule by mouth daily.      Marland Kitchen NIFEdipine (PROCARDIA XL/ADALAT-CC) 60 MG 24 hr tablet Take 60 mg by mouth 2 (two) times daily.      . nitroGLYCERIN (NITRODUR - DOSED IN MG/24 HR) 0.4 mg/hr patch Place 0.4 mg onto the skin daily.    Marland Kitchen omeprazole (PRILOSEC) 20 MG capsule Take 20 mg by mouth daily.      . simvastatin (ZOCOR) 20 MG tablet Take 20 mg by mouth at bedtime.       No current facility-administered medications for this visit.     REVIEW OF SYSTEMS: See HPI for pertinent positives and  negatives.  Physical Examination Filed Vitals:   04/20/16 1511  BP: 112/58  Pulse: 50  Temp: 96.8 F (36 C)  TempSrc: Oral  Resp: 16  Height: 5\' 7"  (1.702 m)  Weight: 154 lb (69.854 kg)  SpO2: 97%   Body mass index is 24.11 kg/(m^2).  General: A&O x 3, WDWN.  Gait: normal  Eyes: PERRLA.  Pulmonary: Breathing is non labored, CTAB.  Cardiac: regular rhythm, no detected murmur.   Aorta is not palpable.  Radial pulses: 2+ palpable and =.   VASCULAR EXAM:  Extremities without ischemic changes  without Gangrene; without open wounds.   LE Pulses  LEFT  RIGHT   FEMORAL  not palpable  palpable   POPLITEAL  not palpable  not palpable   POSTERIOR TIBIAL  not palpable  palpable   DORSALIS PEDIS  ANTERIOR TIBIAL  Not palpable  Not palpable    Abdomen: soft, NT, no palpable masses.  Skin: no rashes, no ulcers.  Musculoskeletal: no muscle wasting or atrophy.  Neurologic: A&O X 3; Appropriate Affect ; SENSATION: normal; MOTOR FUNCTION: moving all extremities equally. Speech is fluent/normal. CN 2-12 intact.         Non-Invasive Vascular Imaging (04/20/2016):   LOWER EXTREMITY ARTERIAL DUPLEX EVALUATION    INDICATION: Peripheral arterial disease    PREVIOUS INTERVENTION(S): Angioplasty of left limb of an aorto-bililiac bypass graft 10-16-15.  History of right fem-pop bypass graft.    DUPLEX EXAM:     Fem-pop bypass graft  LEFT   Peak Systolic Velocity (cm/s) Ratio (if abnormal) Waveform  Peak Systolic Velocity (cm/s) Ratio (if abnormal) Waveform  164 Inflow  M Common Femoral Artery 55  M  113 Prox anas   Deep Femoral Artery NV    43 prox graft  B Superficial Femoral Artery Proximal 89  M  64 mid graft  B Superficial Femoral Artery Mid 73>128  M  92>219 distal graft  B Superficial Femoral Artery Distal 43  M  101 dist anas  M Popliteal Artery 52>106  M  137 Outflow  B Posterior Tibial Artery Dist 18  M     Anterior Tibial Artery Distal 78  M      Peroneal Artery Distal 10  M  0.94/0.68 Today's ABI / TBI 0.66/0.52   Previous ABI / TBI (NA  )     Waveform:    M - Monophasic       B - Biphasic       T - Triphasic  If Ankle Brachial Index (ABI) or Toe Brachial Index (TBI) performed, please see complete report     ADDITIONAL FINDINGS:     IMPRESSION: 1.  50-79% stenosis in the distal right bypass graft. 2. Elevated velocities in the left distal limb of aorto bifemoral graft consistent with angiogram. 3. 30-49% left mid/distal femoral artery stenosis. 4. 30-49% left proximal popliteal artery stenosis.    ABI (Date: 04/20/2016)  R: 0.94, DP: triphasic, PT: triphasic, TBI: 0.68  L: 0.66, DP: monophasic, PT: monophasic, TBI: 0.52   ASSESSMENT:  Tyler [name] is a 82 y.o. male who is s/p right CEA in 07/17/09 by Dr. Trula Slade, Aortobifemoral bypass grafting and bilateral renal artery bypass graft in1995, and right femoral to above knee popliteal artery bypass using vein graft in 1995 and 2003 by Dr. Amedeo Plenty.  He then had an angioplasty of the left limb of the aorto-bi-iliac bypass graft on 10/16/15.  He has a tired feeling in his bilateral buttocks after walking about a hundred yards, relieved by rest, he denies claudication symptoms in thighs or calves; this has been getting worse since he was last here in December of 2016.  He has no signs of ischemia in his feet/legs.  Today's lower extremity arterial duplex suggests 50-79% stenosis in the distal right bypass graft. Elevated velocities in the left  distal limb of aorto bifemoral graft consistent with angiogram. 30-49% left mid/distal femoral artery stenosis. 30-49% left proximal popliteal artery stenosis.   Carotid duplex was performed in November 2016: no restenosis of right ICA, <40% stenosis of left ICA. He has no history of stroke or TIA.  Face to face time with patient was 25 minutes. Over 50% of this time was spent on counseling and coordination of care.    PLAN:    Based on today's exam and non-invasive vascular lab results, tand after discussing with Dr. Scot Dock, the patient will follow up in 1-2 weeks with Dr.  Trula Slade to discuss options to address his worsening claudication in buttocks.  I discussed in depth with the patient the nature of atherosclerosis, and emphasized the importance of maximal medical management including strict control of blood pressure, blood glucose, and lipid levels, obtaining regular exercise, and cessation of smoking.  The patient is aware that without maximal medical management the underlying atherosclerotic disease process will progress, limiting the benefit of any interventions.  The patient was given information about stroke prevention and what symptoms should prompt the patient to seek immediate medical care.  The patient was given information about PAD including signs, symptoms, treatment, what symptoms should prompt the patient to seek immediate medical care, and risk reduction measures to take. Thank you for allowing [place] to participate in this patient's care.  Clemon Chambers, RN, MSN, FNP-C [facility] Office: [phone]  Clinic MD: Scot Dock 04/20/2016 3:46 PM

## 2016-04-28 ENCOUNTER — Encounter: Payer: Self-pay | Admitting: Surgery

## 2016-05-02 ENCOUNTER — Encounter: Payer: Self-pay | Admitting: Surgery

## 2016-05-02 ENCOUNTER — Ambulatory Visit (INDEPENDENT_AMBULATORY_CARE_PROVIDER_SITE_OTHER): Payer: Medicare Other | Admitting: Surgery

## 2016-05-02 VITALS — BP 126/65 | HR 50 | Temp 97.1°F | Resp 14 | Ht 67.0 in | Wt 156.0 lb

## 2016-05-02 DIAGNOSIS — I70213 Atherosclerosis of native arteries of extremities with intermittent claudication, bilateral legs: Secondary | ICD-10-CM | POA: Diagnosis not present

## 2016-05-02 NOTE — Progress Notes (Signed)
Vascular and Vein Specialist of [facility]  Patient name: Tyler [name] MRN: [id number] DOB: 1929/07/24 Sex: male  REASON FOR VISIT: Follow-up  HPI: The patient is back today for followup. He is a former patient of Dr. Amedeo Plenty. He is status post complex aortic reconstruction with an aortobifemoral bypass graft and bilateral renal artery bypass graft. He is also status post right femoral-popliteal bypass graft in 1995 and 2003. I performed a right carotid endarterectomy for asymptomatic stenosis. 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 He underwent angiography on 04/15/2014 to better define this. I found approximately 80% stenosis within the distal limb of the aortobifemoral graft. On 05/27/2014, he underwent drug coated balloon angioplasty of what appeared to be a stenosis within the limb of his aortobifemoral graft. There was a residual stenosis after angioplasty.    He comes in today with subjective complaints of worsening symptoms of buttock claudication with ambulation.  He can still walk approximately 100 yards.  He does not have rest pain or nonhealing wounds.   Past Medical History:  Diagnosis Date  . Cancer ([place])    kidney  . Carotid artery occlusion   . CHF (congestive heart failure) ([place])   . COPD (chronic obstructive pulmonary disease) ([place])   . Coronary artery disease   . DVT (deep venous thrombosis) ([place])   . Hyperlipidemia   . Hypertension   . Myocardial infarction ([place]) 2009  . Peripheral vascular disease ([place])   . Ulcer     Family History  Problem Relation Age of Onset  . Hypertension Other   . Coronary artery disease Other   . Alzheimer's disease Other   . Other Other     AAA  . Arthritis Other   . Diabetes Other   . Seizures Other   . Heart disease Father     Aneurysm, Abdominal Aortic    SOCIAL HISTORY: Social History  Substance Use Topics  . Smoking status: Former Smoker    Packs/day:  1.00    Years: 40.00    Types: Cigarettes    Quit date: 10/11/1999  . Smokeless tobacco: Never Used  . Alcohol use No    Allergies  Allergen Reactions  . Penicillins Swelling and Rash    Has patient had a PCN reaction causing immediate rash, facial/tongue/throat swelling, SOB or lightheadedness with hypotension: Yes Has patient had a PCN reaction causing severe rash involving mucus membranes or skin necrosis: No Has patient had a PCN reaction that required hospitalization No Has patient had a PCN reaction occurring within the last 10 years: No If all of the above answers are "NO", then may proceed with Cephalosporin use.     Current Outpatient Prescriptions  Medication Sig Dispense Refill  . aspirin EC 81 MG tablet Take 81 mg by mouth daily.      . carvedilol (COREG) 6.25 MG tablet Take 6.25 mg by mouth 2 (two) times daily with a meal.      . dicyclomine (BENTYL) 10 MG capsule Take 10 mg by mouth 4 (four) times daily -  before meals and at bedtime.    Marland Kitchen doxazosin (CARDURA) 4 MG tablet Take 4 mg by mouth at bedtime.      . furosemide (LASIX) 40 MG tablet Take 40 mg by mouth every other day.    . levothyroxine (SYNTHROID, LEVOTHROID) 25 MCG tablet Take 25 mcg by mouth daily before breakfast.    . Multiple Vitamin (MULTIVITAMIN) capsule Take 1 capsule by mouth daily.      Marland Kitchen  NIFEdipine (PROCARDIA XL/ADALAT-CC) 60 MG 24 hr tablet Take 60 mg by mouth 2 (two) times daily.      . nitroGLYCERIN (NITRODUR - DOSED IN MG/24 HR) 0.4 mg/hr patch Place 0.4 mg onto the skin daily.    Marland Kitchen omeprazole (PRILOSEC) 20 MG capsule Take 20 mg by mouth daily.      . simvastatin (ZOCOR) 20 MG tablet Take 20 mg by mouth at bedtime.       No current facility-administered medications for this visit.     REVIEW OF SYSTEMS:  [X]  denotes positive finding, [ ]  denotes negative finding Cardiac  Comments:  Chest pain or chest pressure:    Shortness of breath upon exertion:    Short of breath when lying flat:      Irregular heart rhythm:        Vascular    Pain in calf, thigh, or hip brought on by ambulation: x   Pain in feet at night that wakes you up from your sleep:     Blood clot in your veins:    Leg swelling:         Pulmonary    Oxygen at home:    Productive cough:     Wheezing:         Neurologic    Sudden weakness in arms or legs:     Sudden numbness in arms or legs:     Sudden onset of difficulty speaking or slurred speech:    Temporary loss of vision in one eye:     Problems with dizziness:         Gastrointestinal    Blood in stool:     Vomited blood:         Genitourinary    Burning when urinating:     Blood in urine:        Psychiatric    Major depression:         Hematologic    Bleeding problems:    Problems with blood clotting too easily:        Skin    Rashes or ulcers:        Constitutional    Fever or chills:      PHYSICAL EXAM: Vitals:   05/02/16 0834  BP: 126/65  Pulse: (!) 50  Resp: 14  Temp: 97.1 F (36.2 C)  TempSrc: Oral  SpO2: 97%  Weight: 156 lb (70.8 kg)  Height: 5\' 7"  (1.702 m)    GENERAL: The patient is a well-nourished male, in no acute distress. The vital signs are documented above. CARDIAC: There is a regular rate and rhythm.  PULMONARY: There is good air exchange bilaterally without wheezing or rales. MUSCULOSKELETAL: There are no major deformities or cyanosis. NEUROLOGIC: No focal weakness or paresthesias are detected. SKIN: There are no ulcers or rashes noted. PSYCHIATRIC: The patient has a normal affect.  DATA:  I have reviewed his ultrasound which shows a stable ankle brachial indices of 0.66 on the left and 0.94 on the right.  There is 50-79% stenosis of the distal right bypass graft.  MEDICAL ISSUES: I discussed with the patient and his wife, there has not been significant change in his ultrasound findings particularly in the left groin.  His most recent intervention was in January 2017.  He did not get significant  benefit from this.  Therefore, I think the next option would be to proceed with surgical intervention which would most likely be a left femoral endarterectomy and repair of  the distal limb of the graft.  He is reluctant to proceed with surgery at this time.  We have scheduled him for follow-up in 3 months with repeat ABIs.    Annamarie Major, MD Vascular and Vein Specialists of [facility] [phone] Pager [phone]

## 2016-08-04 ENCOUNTER — Encounter: Payer: Self-pay | Admitting: Surgery

## 2016-08-08 ENCOUNTER — Ambulatory Visit (INDEPENDENT_AMBULATORY_CARE_PROVIDER_SITE_OTHER)
Admission: RE | Admit: 2016-08-08 | Discharge: 2016-08-08 | Disposition: A | Discharge status: Home / Self Care | Payer: Medicare Other | Source: Ambulatory Visit | Attending: Surgery | Admitting: Surgery

## 2016-08-08 ENCOUNTER — Ambulatory Visit (HOSPITAL_COMMUNITY)
Admission: RE | Admit: 2016-08-08 | Discharge: 2016-08-08 | Disposition: A | Discharge status: Home / Self Care | Payer: Medicare Other | Source: Ambulatory Visit | Attending: Surgery | Admitting: Surgery

## 2016-08-08 ENCOUNTER — Encounter: Payer: Self-pay | Admitting: Surgery

## 2016-08-08 ENCOUNTER — Ambulatory Visit (INDEPENDENT_AMBULATORY_CARE_PROVIDER_SITE_OTHER): Payer: Medicare Other | Admitting: Surgery

## 2016-08-08 VITALS — BP 154/66 | HR 53 | Temp 97.0°F | Resp 18 | Ht 67.0 in | Wt 159.3 lb

## 2016-08-08 DIAGNOSIS — I10 Essential (primary) hypertension: Secondary | ICD-10-CM | POA: Insufficient documentation

## 2016-08-08 DIAGNOSIS — I739 Peripheral vascular disease, unspecified: Secondary | ICD-10-CM

## 2016-08-08 DIAGNOSIS — I70213 Atherosclerosis of native arteries of extremities with intermittent claudication, bilateral legs: Secondary | ICD-10-CM | POA: Diagnosis not present

## 2016-08-08 DIAGNOSIS — E785 Hyperlipidemia, unspecified: Secondary | ICD-10-CM | POA: Diagnosis not present

## 2016-08-08 DIAGNOSIS — Z95828 Presence of other vascular implants and grafts: Secondary | ICD-10-CM | POA: Diagnosis not present

## 2016-08-08 DIAGNOSIS — I70212 Atherosclerosis of native arteries of extremities with intermittent claudication, left leg: Secondary | ICD-10-CM | POA: Diagnosis not present

## 2016-08-08 NOTE — Progress Notes (Signed)
Vascular and Vein Specialist of [facility]  Patient name: Tyler [name] MRN: [id number] DOB: 1928-10-16 Sex: male  REASON FOR VISIT: follow up  HPI: The patient is back today for followup. He is a former patient of Dr. Amedeo Plenty. He is status post complex aortic reconstruction with an aortobifemoral bypass graft and bilateral renal artery bypass graft. He is also status post right femoral-popliteal bypass graft in 1995 and 2003. I performed a right carotid endarterectomy for asymptomatic stenosis. 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 He underwent angiography on 04/15/2014 to better define this. I found approximately 80% stenosis within the distal limb of the aortobifemoral graft. On 05/27/2014, he underwent drug coated balloon angioplasty of what appeared to be a stenosis within the limb of his aortobifemoral graft. There was a residual stenosis after angioplasty.    He states that he has had no change in his symptoms.  Past Medical History:  Diagnosis Date  . Cancer ([place])    kidney  . Carotid artery occlusion   . CHF (congestive heart failure) ([place])   . COPD (chronic obstructive pulmonary disease) ([place])   . Coronary artery disease   . DVT (deep venous thrombosis) ([place])   . Hyperlipidemia   . Hypertension   . Myocardial infarction 2009  . Peripheral vascular disease ([place])   . Ulcer ([place])     Family History  Problem Relation Age of Onset  . Heart disease Father     Aneurysm, Abdominal Aortic  . Hypertension Other   . Coronary artery disease Other   . Alzheimer's disease Other   . Other Other     AAA  . Arthritis Other   . Diabetes Other   . Seizures Other     SOCIAL HISTORY: Social History  Substance Use Topics  . Smoking status: Former Smoker    Packs/day: 1.00    Years: 40.00    Types: Cigarettes    Quit date: 10/11/1999  . Smokeless tobacco: Never Used  . Alcohol use No    Allergies  Allergen  Reactions  . Penicillins Swelling and Rash    Has patient had a PCN reaction causing immediate rash, facial/tongue/throat swelling, SOB or lightheadedness with hypotension: Yes Has patient had a PCN reaction causing severe rash involving mucus membranes or skin necrosis: No Has patient had a PCN reaction that required hospitalization No Has patient had a PCN reaction occurring within the last 10 years: No If all of the above answers are "NO", then may proceed with Cephalosporin use.     Current Outpatient Prescriptions  Medication Sig Dispense Refill  . aspirin EC 81 MG tablet Take 81 mg by mouth daily.      . carvedilol (COREG) 6.25 MG tablet Take 6.25 mg by mouth 2 (two) times daily with a meal.      . dicyclomine (BENTYL) 10 MG capsule Take 10 mg by mouth 4 (four) times daily -  before meals and at bedtime.    Marland Kitchen doxazosin (CARDURA) 4 MG tablet Take 4 mg by mouth at bedtime.      . furosemide (LASIX) 40 MG tablet Take 40 mg by mouth every other day.    . levothyroxine (SYNTHROID, LEVOTHROID) 25 MCG tablet Take 12.5 mcg by mouth daily before breakfast.     . Multiple Vitamin (MULTIVITAMIN) capsule Take 1 capsule by mouth daily.      Marland Kitchen NIFEdipine (PROCARDIA XL/ADALAT-CC) 60 MG 24 hr tablet Take 60 mg by mouth 2 (two)  times daily.      . nitroGLYCERIN (NITRODUR - DOSED IN MG/24 HR) 0.4 mg/hr patch Place 0.4 mg onto the skin daily.    Marland Kitchen omeprazole (PRILOSEC) 20 MG capsule Take 20 mg by mouth daily.      . simvastatin (ZOCOR) 20 MG tablet Take 20 mg by mouth at bedtime.       No current facility-administered medications for this visit.     REVIEW OF SYSTEMS:  [X]  denotes positive finding, [ ]  denotes negative finding Cardiac  Comments:  Chest pain or chest pressure:    Shortness of breath upon exertion:    Short of breath when lying flat:    Irregular heart rhythm:        Vascular    Pain in calf, thigh, or hip brought on by ambulation: x   Pain in feet at night that wakes you up  from your sleep:     Blood clot in your veins:    Leg swelling:         Pulmonary    Oxygen at home:    Productive cough:     Wheezing:         Neurologic    Sudden weakness in arms or legs:     Sudden numbness in arms or legs:     Sudden onset of difficulty speaking or slurred speech:    Temporary loss of vision in one eye:     Problems with dizziness:         Gastrointestinal    Blood in stool:     Vomited blood:         Genitourinary    Burning when urinating:     Blood in urine:        Psychiatric    Major depression:         Hematologic    Bleeding problems:    Problems with blood clotting too easily:        Skin    Rashes or ulcers:        Constitutional    Fever or chills:      PHYSICAL EXAM: Vitals:   08/08/16 1042 08/08/16 1043  BP: (!) 148/70 (!) 154/66  Pulse: (!) 53   Resp: 18   Temp: 97 F (36.1 C)   TempSrc: Oral   SpO2: 98%   Weight: 159 lb 4.8 oz (72.3 kg)   Height: 5\' 7"  (1.702 m)     GENERAL: The patient is a well-nourished male, in no acute distress. The vital signs are documented above. CARDIAC: There is a regular rate and rhythm.  VASCULAR: Palpable left femoral pulse PULMONARY: There is good air exchange bilaterally without wheezing or rales. MUSCULOSKELETAL: There are no major deformities or cyanosis. NEUROLOGIC: No focal weakness or paresthesias are detected. SKIN: There are no ulcers or rashes noted. PSYCHIATRIC: The patient has a normal affect.  DATA:  I have reviewed his ultrasound.  Greater than 70% stenosis of the distal left limb of his aortobifemoral bypass graft roughly 3 cm from the distal anastomosis.  Velocity was 5 68 cm/s  MEDICAL ISSUES: The patient's symptoms remain stable.  When reviewing his ultrasound from December 2016, he had velocity elevations greater than 800.  He did not have a significant improvement with angioplasty.  Therefore I suspect that the stenosis remains relatively stable.  The patient does not  want to proceed with surgical intervention, which I feel is his next option.  Therefore we have elected to  continue with monitoring.  The next evaluation will be in 6 months.    Annamarie Major, MD Vascular and Vein Specialists of [facility] [phone] Pager [phone]

## 2016-08-23 NOTE — Addendum Note (Signed)
Addended by: Thresa Ross C on: 08/23/2016 03:25 PM   Modules accepted: Orders

## 2017-01-27 ENCOUNTER — Encounter: Payer: Self-pay | Admitting: Surgery

## 2017-02-03 ENCOUNTER — Other Ambulatory Visit: Payer: Self-pay | Admitting: *Deleted

## 2017-02-03 DIAGNOSIS — I739 Peripheral vascular disease, unspecified: Secondary | ICD-10-CM

## 2017-02-06 ENCOUNTER — Ambulatory Visit (INDEPENDENT_AMBULATORY_CARE_PROVIDER_SITE_OTHER)
Admission: RE | Admit: 2017-02-06 | Discharge: 2017-02-06 | Disposition: A | Discharge status: Home / Self Care | Payer: Medicare Other | Source: Ambulatory Visit | Attending: Surgery | Admitting: Surgery

## 2017-02-06 ENCOUNTER — Ambulatory Visit (HOSPITAL_COMMUNITY)
Admission: RE | Admit: 2017-02-06 | Discharge: 2017-02-06 | Disposition: A | Discharge status: Home / Self Care | Payer: Medicare Other | Source: Ambulatory Visit | Attending: Surgery | Admitting: Surgery

## 2017-02-06 ENCOUNTER — Encounter: Payer: Self-pay | Admitting: Surgery

## 2017-02-06 ENCOUNTER — Ambulatory Visit (INDEPENDENT_AMBULATORY_CARE_PROVIDER_SITE_OTHER): Payer: Medicare Other | Admitting: Surgery

## 2017-02-06 VITALS — BP 141/74 | HR 63 | Temp 96.7°F | Resp 20 | Ht 67.0 in | Wt 160.0 lb

## 2017-02-06 DIAGNOSIS — Z9862 Peripheral vascular angioplasty status: Secondary | ICD-10-CM | POA: Insufficient documentation

## 2017-02-06 DIAGNOSIS — Z87891 Personal history of nicotine dependence: Secondary | ICD-10-CM | POA: Diagnosis not present

## 2017-02-06 DIAGNOSIS — I6522 Occlusion and stenosis of left carotid artery: Secondary | ICD-10-CM | POA: Diagnosis not present

## 2017-02-06 DIAGNOSIS — I739 Peripheral vascular disease, unspecified: Secondary | ICD-10-CM

## 2017-02-06 DIAGNOSIS — E785 Hyperlipidemia, unspecified: Secondary | ICD-10-CM | POA: Diagnosis not present

## 2017-02-06 DIAGNOSIS — I10 Essential (primary) hypertension: Secondary | ICD-10-CM | POA: Diagnosis not present

## 2017-02-06 DIAGNOSIS — I70212 Atherosclerosis of native arteries of extremities with intermittent claudication, left leg: Secondary | ICD-10-CM

## 2017-02-06 DIAGNOSIS — Z95828 Presence of other vascular implants and grafts: Secondary | ICD-10-CM | POA: Insufficient documentation

## 2017-02-06 DIAGNOSIS — I6529 Occlusion and stenosis of unspecified carotid artery: Secondary | ICD-10-CM | POA: Diagnosis not present

## 2017-02-06 LAB — VAS US CAROTID
LCCAPSYS: 80 cm/s
LEFT ECA DIAS: -7 cm/s
Left CCA dist dias: -19 cm/s
Left CCA dist sys: -97 cm/s
Left CCA prox dias: 14 cm/s
Left ICA dist dias: -22 cm/s
Left ICA dist sys: -96 cm/s
Left ICA prox dias: 30 cm/s
Left ICA prox sys: 121 cm/s
RCCADSYS: -72 cm/s
RCCAPDIAS: 9 cm/s
RIGHT CCA MID DIAS: 13 cm/s
RIGHT ECA DIAS: -5 cm/s
RIGHT VERTEBRAL DIAS: 15 cm/s
Right CCA prox sys: 80 cm/s

## 2017-02-06 NOTE — Progress Notes (Signed)
Vascular and Vein Specialist of [facility]  Patient name: Tyler [name] MRN: [id number] DOB: Jan 20, 1929 Sex: male   REASON FOR VISIT:    Follow up  [place]:   The patient is back today for followup. He is a former patient of Dr. Amedeo Plenty. He is status post complex aortic reconstruction with an aortobifemoral bypass graft and bilateral renal artery bypass graft. He is also status post right femoral-popliteal bypass graft in 1995 and 2003. I performed a right carotid endarterectomy for asymptomatic stenosis. 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 He underwent angiography on 04/15/2014 to better define this. I found approximately 80% stenosis within the distal limb of the aortobifemoral graft. On 05/27/2014, he underwent drug coated balloon angioplasty of what appeared to be a stenosis within the limb of his aortobifemoral graft. There was a residual stenosis after angioplasty.    He states that he has had no change in his symptoms.   PAST MEDICAL HISTORY:   Past Medical History:  Diagnosis Date  . Cancer ([place])    kidney  . Carotid artery occlusion   . CHF (congestive heart failure) ([place])   . COPD (chronic obstructive pulmonary disease) ([place])   . Coronary artery disease   . DVT (deep venous thrombosis) ([place])   . Hyperlipidemia   . Hypertension   . Myocardial infarction ([place]) 2009  . Peripheral vascular disease ([place])   . Ulcer      FAMILY HISTORY:   Family History  Problem Relation Age of Onset  . Heart disease Father     Aneurysm, Abdominal Aortic  . Hypertension Other   . Coronary artery disease Other   . Alzheimer's disease Other   . Other Other     AAA  . Arthritis Other   . Diabetes Other   . Seizures Other     SOCIAL HISTORY:   Social History  Substance Use Topics  . Smoking status: Former Smoker    Packs/day: 1.00    Years: 40.00    Types: Cigarettes    Quit date:  10/11/1999  . Smokeless tobacco: Never Used  . Alcohol use No     ALLERGIES:   Allergies  Allergen Reactions  . Penicillins Swelling and Rash    Has patient had a PCN reaction causing immediate rash, facial/tongue/throat swelling, SOB or lightheadedness with hypotension: Yes Has patient had a PCN reaction causing severe rash involving mucus membranes or skin necrosis: No Has patient had a PCN reaction that required hospitalization No Has patient had a PCN reaction occurring within the last 10 years: No If all of the above answers are "NO", then may proceed with Cephalosporin use.      CURRENT MEDICATIONS:   Current Outpatient Prescriptions  Medication Sig Dispense Refill  . aspirin EC 81 MG tablet Take 81 mg by mouth daily.      . carvedilol (COREG) 6.25 MG tablet Take 6.25 mg by mouth 2 (two) times daily with a meal.      . dicyclomine (BENTYL) 10 MG capsule Take 10 mg by mouth 4 (four) times daily -  before meals and at bedtime.    Marland Kitchen doxazosin (CARDURA) 4 MG tablet Take 4 mg by mouth at bedtime.      . furosemide (LASIX) 40 MG tablet Take 40 mg by mouth every other day.    . levothyroxine (SYNTHROID, LEVOTHROID) 50 MCG tablet Take 50 mcg by mouth daily before breakfast.    . Multiple  Vitamin (MULTIVITAMIN) capsule Take 1 capsule by mouth daily.      Marland Kitchen NIFEdipine (PROCARDIA XL/ADALAT-CC) 60 MG 24 hr tablet Take 60 mg by mouth 2 (two) times daily.      . nitroGLYCERIN (NITRODUR - DOSED IN MG/24 HR) 0.4 mg/hr patch Place 0.4 mg onto the skin daily.    Marland Kitchen omeprazole (PRILOSEC) 20 MG capsule Take 20 mg by mouth daily.      . simvastatin (ZOCOR) 20 MG tablet Take 20 mg by mouth at bedtime.       No current facility-administered medications for this visit.     REVIEW OF SYSTEMS:   [X]  denotes positive finding, [ ]  denotes negative finding Cardiac  Comments:  Chest pain or chest pressure:    Shortness of breath upon exertion:    Short of breath when lying flat:    Irregular heart  rhythm:        Vascular    Pain in calf, thigh, or hip brought on by ambulation: x   Pain in feet at night that wakes you up from your sleep:     Blood clot in your veins:    Leg swelling:         Pulmonary    Oxygen at home:    Productive cough:     Wheezing:         Neurologic    Sudden weakness in arms or legs:     Sudden numbness in arms or legs:     Sudden onset of difficulty speaking or slurred speech:    Temporary loss of vision in one eye:     Problems with dizziness:         Gastrointestinal    Blood in stool:     Vomited blood:         Genitourinary    Burning when urinating:     Blood in urine:        Psychiatric    Major depression:         Hematologic    Bleeding problems:    Problems with blood clotting too easily:        Skin    Rashes or ulcers:        Constitutional    Fever or chills:      PHYSICAL EXAM:   Vitals:   02/06/17 1008 02/06/17 1013  BP: (!) 154/69 (!) 141/74  Pulse: 63   Resp: 20   Temp: (!) 96.7 F (35.9 C)   TempSrc: Oral   SpO2: 97%   Weight: 160 lb (72.6 kg)   Height: 5\' 7"  (1.702 m)     GENERAL: The patient is a well-nourished male, in no acute distress. The vital signs are documented above. CARDIAC: There is a regular rate and rhythm.  PULMONARY: Non-labored respirations MUSCULOSKELETAL: There are no major deformities or cyanosis. NEUROLOGIC: No focal weakness or paresthesias are detected. SKIN: There are no ulcers or rashes noted. PSYCHIATRIC: The patient has a normal affect.  STUDIES:   I have ordered and reviewed his vascular lab studies Carotid duplex: Right carotid endarterectomy is widely patent.  Less than 40% left carotid stenosis Aortoiliac duplex: Patent right limb of the aortobifemoral bypass graft with increased velocities in the distal limb of the aortic graft, 587 ABI: Right 1.06, left 0.74 Lower extremity duplex: Patent femoral popliteal bypass graft with increased velocities in the distal graft  in the 50-74% range of (right)  MEDICAL ISSUES:   Claudication: The patient's symptoms  remain stable.  The next step would be surgical intervention in the left groin.  His symptoms and ultrasound studies have remained stable over time, and therefore we will continue with observation.    Annamarie Major, MD Vascular and Vein Specialists of [facility] [phone] Pager [phone]

## 2017-02-07 NOTE — Addendum Note (Signed)
Addended by: Lianne Cure A on: 02/07/2017 09:34 AM   Modules accepted: Orders

## 2017-02-07 NOTE — Addendum Note (Signed)
Addended by: Lianne Cure A on: 02/07/2017 10:56 AM   Modules accepted: Orders

## 2017-02-07 NOTE — Addendum Note (Signed)
Addended by: Lianne Cure A on: 02/07/2017 11:12 AM   Modules accepted: Orders

## 2017-08-14 ENCOUNTER — Ambulatory Visit: Payer: Medicare Other | Admitting: Surgery

## 2017-08-14 ENCOUNTER — Other Ambulatory Visit (HOSPITAL_COMMUNITY): Payer: Medicare Other

## 2017-08-14 ENCOUNTER — Encounter (HOSPITAL_COMMUNITY): Payer: Medicare Other

## 2017-09-06 ENCOUNTER — Encounter: Payer: Self-pay | Admitting: Surgery

## 2017-09-06 ENCOUNTER — Ambulatory Visit (INDEPENDENT_AMBULATORY_CARE_PROVIDER_SITE_OTHER)
Admission: RE | Admit: 2017-09-06 | Discharge: 2017-09-06 | Disposition: A | Discharge status: Home / Self Care | Payer: Medicare Other | Source: Ambulatory Visit | Attending: Surgery | Admitting: Surgery

## 2017-09-06 ENCOUNTER — Ambulatory Visit (HOSPITAL_COMMUNITY)
Admission: RE | Admit: 2017-09-06 | Discharge: 2017-09-06 | Disposition: A | Discharge status: Home / Self Care | Payer: Medicare Other | Source: Ambulatory Visit | Attending: Surgery | Admitting: Surgery

## 2017-09-06 ENCOUNTER — Ambulatory Visit (INDEPENDENT_AMBULATORY_CARE_PROVIDER_SITE_OTHER): Payer: Medicare Other | Admitting: Surgery

## 2017-09-06 VITALS — BP 157/74 | HR 57 | Temp 97.1°F | Resp 20 | Ht 67.0 in | Wt 155.5 lb

## 2017-09-06 DIAGNOSIS — I739 Peripheral vascular disease, unspecified: Secondary | ICD-10-CM

## 2017-09-06 DIAGNOSIS — I701 Atherosclerosis of renal artery: Secondary | ICD-10-CM

## 2017-09-06 DIAGNOSIS — I70212 Atherosclerosis of native arteries of extremities with intermittent claudication, left leg: Secondary | ICD-10-CM

## 2017-09-06 DIAGNOSIS — I6529 Occlusion and stenosis of unspecified carotid artery: Secondary | ICD-10-CM

## 2017-09-06 NOTE — Progress Notes (Signed)
Vascular and Vein Specialist of [facility]  Patient name: Tyler [name] MRN: [id number] DOB: 1929-08-04 Sex: male   REASON FOR VISIT:    Follow up  [place]:    The patient is back today for followup. He is a former patient of Dr. Amedeo Plenty. He is status post complex aortic reconstruction with an aortobifemoral bypass graft and bilateral renal artery bypass graft. He is also status post right femoral-popliteal bypass graft in 1995 and 2003. I performed a right carotid endarterectomy for asymptomatic stenosis. 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 He underwent angiography on 04/15/2014 to better define this. I found approximately 80% stenosis within the distal limb of the aortobifemoral graft. On 05/27/2014, he underwent drug coated balloon angioplasty of what appeared to be a stenosis within the limb of his aortobifemoral graft. There was a residual stenosis after angioplasty.   He continues to walk approximately 100 yards before he has left leg and hip claudication.  This is not lifestyle threatening to him.  He is not interested in another operation.  PAST MEDICAL HISTORY:   Past Medical History:  Diagnosis Date  . Cancer ([place])    kidney  . Carotid artery occlusion   . CHF (congestive heart failure) ([place])   . COPD (chronic obstructive pulmonary disease) ([place])   . Coronary artery disease   . DVT (deep venous thrombosis) ([place])   . Hyperlipidemia   . Hypertension   . Myocardial infarction ([place]) 2009  . Peripheral vascular disease ([place])   . Ulcer      FAMILY HISTORY:   Family History  Problem Relation Age of Onset  . Heart disease Father        Aneurysm, Abdominal Aortic  . Hypertension Other   . Coronary artery disease Other   . Alzheimer's disease Other   . Other Other        AAA  . Arthritis Other   . Diabetes Other   . Seizures Other     SOCIAL HISTORY:   Social History   Tobacco  Use  . Smoking status: Former Smoker    Packs/day: 1.00    Years: 40.00    Pack years: 40.00    Types: Cigarettes    Last attempt to quit: 10/11/1999    Years since quitting: 17.9  . Smokeless tobacco: Never Used  Substance Use Topics  . Alcohol use: No    Alcohol/week: 0.0 oz     ALLERGIES:   Allergies  Allergen Reactions  . Penicillins Swelling and Rash    Has patient had a PCN reaction causing immediate rash, facial/tongue/throat swelling, SOB or lightheadedness with hypotension: Yes Has patient had a PCN reaction causing severe rash involving mucus membranes or skin necrosis: No Has patient had a PCN reaction that required hospitalization No Has patient had a PCN reaction occurring within the last 10 years: No If all of the above answers are "NO", then may proceed with Cephalosporin use.      CURRENT MEDICATIONS:   Current Outpatient Medications  Medication Sig Dispense Refill  . aspirin EC 81 MG tablet Take 81 mg by mouth daily.      . carvedilol (COREG) 6.25 MG tablet Take 6.25 mg by mouth 2 (two) times daily with a meal.      . dicyclomine (BENTYL) 10 MG capsule Take 10 mg by mouth 4 (four) times daily -  before meals and at bedtime.    Marland Kitchen doxazosin (CARDURA) 4 MG  tablet Take 4 mg by mouth at bedtime.      . furosemide (LASIX) 40 MG tablet Take 40 mg by mouth every other day.    . levothyroxine (SYNTHROID, LEVOTHROID) 50 MCG tablet Take 50 mcg by mouth daily before breakfast.    . Multiple Vitamin (MULTIVITAMIN) capsule Take 1 capsule by mouth daily.      Marland Kitchen NIFEdipine (PROCARDIA XL/ADALAT-CC) 60 MG 24 hr tablet Take 60 mg by mouth 2 (two) times daily.      . nitroGLYCERIN (NITRODUR - DOSED IN MG/24 HR) 0.4 mg/hr patch Place 0.4 mg onto the skin daily.    Marland Kitchen omeprazole (PRILOSEC) 20 MG capsule Take 20 mg by mouth daily.      . simvastatin (ZOCOR) 20 MG tablet Take 20 mg by mouth at bedtime.       No current facility-administered medications for this visit.     REVIEW  OF SYSTEMS:   [X]  denotes positive finding, [ ]  denotes negative finding Cardiac  Comments:  Chest pain or chest pressure:    Shortness of breath upon exertion:    Short of breath when lying flat:    Irregular heart rhythm:        Vascular    Pain in calf, thigh, or hip brought on by ambulation: x   Pain in feet at night that wakes you up from your sleep:     Blood clot in your veins:    Leg swelling:         Pulmonary    Oxygen at home:    Productive cough:     Wheezing:         Neurologic    Sudden weakness in arms or legs:     Sudden numbness in arms or legs:     Sudden onset of difficulty speaking or slurred speech:    Temporary loss of vision in one eye:     Problems with dizziness:         Gastrointestinal    Blood in stool:     Vomited blood:         Genitourinary    Burning when urinating:     Blood in urine:        Psychiatric    Major depression:         Hematologic    Bleeding problems:    Problems with blood clotting too easily:        Skin    Rashes or ulcers:        Constitutional    Fever or chills:      PHYSICAL EXAM:   Vitals:   09/06/17 0931 09/06/17 0933  BP: (!) 158/78 (!) 157/74  Pulse: (!) 57   Resp: 20   Temp: (!) 97.1 F (36.2 C)   TempSrc: Oral   SpO2: 97%   Weight: 155 lb 8 oz (70.5 kg)   Height: 5\' 7"  (1.702 m)     GENERAL: The patient is a well-nourished male, in no acute distress. The vital signs are documented above. CARDIAC: There is a regular rate and rhythm.  PULMONARY: Non-labored respirations MUSCULOSKELETAL: There are no major deformities or cyanosis. NEUROLOGIC: No focal weakness or paresthesias are detected. SKIN: There are no ulcers or rashes noted. PSYCHIATRIC: The patient has a normal affect.  STUDIES:   I have reviewed his vascular lab studies today.  His ABIs are essentially unchanged.  The right is 0.94.  The left is 0.72.  Duplex suggests 50-74% stenosis  in the outflow vessel on the left which is  unchanged.  MEDICAL ISSUES:   The patient's symptoms remained stable.  He is not interested in surgical revascularization at this time.  I will have him follow-up in 6 months with repeat vascular lab studies of his legs as well as his annual carotid study.    Annamarie Major, MD Vascular and Vein Specialists of [facility] [phone] Pager [phone]

## 2017-09-07 NOTE — Addendum Note (Signed)
Addended by: Lianne Cure A on: 09/07/2017 02:13 PM   Modules accepted: Orders

## 2018-02-12 ENCOUNTER — Ambulatory Visit: Payer: Medicare Other | Admitting: Surgery

## 2018-02-12 ENCOUNTER — Encounter (HOSPITAL_COMMUNITY): Payer: Medicare Other

## 2018-03-26 ENCOUNTER — Ambulatory Visit (HOSPITAL_COMMUNITY)
Admission: RE | Admit: 2018-03-26 | Discharge: 2018-03-26 | Disposition: A | Discharge status: Home / Self Care | Payer: Medicare Other | Source: Ambulatory Visit | Attending: Surgery | Admitting: Surgery

## 2018-03-26 ENCOUNTER — Ambulatory Visit (INDEPENDENT_AMBULATORY_CARE_PROVIDER_SITE_OTHER): Payer: Medicare Other | Admitting: Surgery

## 2018-03-26 ENCOUNTER — Encounter: Payer: Self-pay | Admitting: Surgery

## 2018-03-26 ENCOUNTER — Ambulatory Visit (INDEPENDENT_AMBULATORY_CARE_PROVIDER_SITE_OTHER)
Admission: RE | Admit: 2018-03-26 | Discharge: 2018-03-26 | Disposition: A | Discharge status: Home / Self Care | Payer: Medicare Other | Source: Ambulatory Visit | Attending: Surgery | Admitting: Surgery

## 2018-03-26 ENCOUNTER — Other Ambulatory Visit: Payer: Self-pay

## 2018-03-26 VITALS — BP 143/73 | HR 62 | Temp 97.7°F | Resp 20 | Ht 67.0 in | Wt 157.0 lb

## 2018-03-26 DIAGNOSIS — I6529 Occlusion and stenosis of unspecified carotid artery: Secondary | ICD-10-CM | POA: Diagnosis not present

## 2018-03-26 DIAGNOSIS — I701 Atherosclerosis of renal artery: Secondary | ICD-10-CM

## 2018-03-26 DIAGNOSIS — I70212 Atherosclerosis of native arteries of extremities with intermittent claudication, left leg: Secondary | ICD-10-CM | POA: Insufficient documentation

## 2018-03-26 DIAGNOSIS — I6523 Occlusion and stenosis of bilateral carotid arteries: Secondary | ICD-10-CM | POA: Insufficient documentation

## 2018-03-26 DIAGNOSIS — I10 Essential (primary) hypertension: Secondary | ICD-10-CM | POA: Diagnosis not present

## 2018-03-26 DIAGNOSIS — E785 Hyperlipidemia, unspecified: Secondary | ICD-10-CM | POA: Diagnosis not present

## 2018-03-26 DIAGNOSIS — Z87891 Personal history of nicotine dependence: Secondary | ICD-10-CM | POA: Diagnosis not present

## 2018-03-26 NOTE — Progress Notes (Signed)
Vascular and Vein Specialist of [facility]  Patient name: Tyler [name] MRN: [id number] DOB: [birth year] Sex: male   REASON FOR VISIT:    Follow up  Tyler [name]:    The patient is back today for followup. He is a former patient of Dr. Amedeo Plenty. He is status post complex aortic reconstruction with an aortobifemoral bypass graft and bilateral renal artery bypass graft. He is also status post right femoral-popliteal bypass graft in 1995 and 2003. I performed a right carotid endarterectomy for asymptomatic stenosis. 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 He underwent angiography on 04/15/2014 to better define this. I found approximately 80% stenosis within the distal limb of the aortobifemoral graft. On 05/27/2014, he underwent drug coated balloon angioplasty of what appeared to be a stenosis within the limb of his aortobifemoral graft. There was a residual stenosis after angioplasty.   Tyler [name] has stable claudication at 100 yeards.  He has recently had some pulsatile tinnitus which improved with placing a tube in his ear.  He has had no neurologic symptoms.  PAST MEDICAL HISTORY:   Past Medical History:  Diagnosis Date  . Cancer ([place])    kidney  . Carotid artery occlusion   . CHF (congestive heart failure) ([place])   . COPD (chronic obstructive pulmonary disease) ([place])   . Coronary artery disease   . DVT (deep venous thrombosis) ([place])   . Hyperlipidemia   . Hypertension   . Myocardial infarction ([place]) 2009  . Peripheral vascular disease ([place])   . Ulcer      FAMILY HISTORY:   Family History  Problem Relation Age of Onset  . Heart disease Father        Aneurysm, Abdominal Aortic  . Hypertension Other   . Coronary artery disease Other   . Alzheimer's disease Other   . Other Other        AAA  . Arthritis Other   . Diabetes Other   . Seizures Other     SOCIAL HISTORY:   Social History   Tobacco Use  .  Smoking status: Former Smoker    Packs/day: 1.00    Years: 40.00    Pack years: 40.00    Types: Cigarettes    Last attempt to quit: 10/11/1999    Years since quitting: 18.4  . Smokeless tobacco: Never Used  Substance Use Topics  . Alcohol use: No    Alcohol/week: 0.0 oz     ALLERGIES:   Allergies  Allergen Reactions  . Penicillins Swelling and Rash    Has patient had a PCN reaction causing immediate rash, facial/tongue/throat swelling, SOB or lightheadedness with hypotension: Yes Has patient had a PCN reaction causing severe rash involving mucus membranes or skin necrosis: No Has patient had a PCN reaction that required hospitalization No Has patient had a PCN reaction occurring within the last 10 years: No If all of the above answers are "NO", then may proceed with Cephalosporin use.      CURRENT MEDICATIONS:   Current Outpatient Medications  Medication Sig Dispense Refill  . aspirin EC 81 MG tablet Take 81 mg by mouth daily.      . carvedilol (COREG) 6.25 MG tablet Take 6.25 mg by mouth 2 (two) times daily with a meal.      . dicyclomine (BENTYL) 10 MG capsule Take 10 mg by mouth 4 (four) times daily -  before meals and at bedtime.    Marland Kitchen doxazosin (CARDURA) 4 MG  tablet Take 4 mg by mouth at bedtime.      . furosemide (LASIX) 40 MG tablet Take 40 mg by mouth every other day.    . levothyroxine (SYNTHROID, LEVOTHROID) 50 MCG tablet Take 50 mcg by mouth daily before breakfast.    . Multiple Vitamin (MULTIVITAMIN) capsule Take 1 capsule by mouth daily.      Marland Kitchen NIFEdipine (PROCARDIA XL/ADALAT-CC) 60 MG 24 hr tablet Take 60 mg by mouth 2 (two) times daily.      . nitroGLYCERIN (NITRODUR - DOSED IN MG/24 HR) 0.4 mg/hr patch Place 0.4 mg onto the skin daily.    Marland Kitchen omeprazole (PRILOSEC) 20 MG capsule Take 20 mg by mouth daily.      . simvastatin (ZOCOR) 20 MG tablet Take 20 mg by mouth at bedtime.       No current facility-administered medications for this visit.     REVIEW OF  SYSTEMS:   [X]  denotes positive finding, [ ]  denotes negative finding Cardiac  Comments:  Chest pain or chest pressure:    Shortness of breath upon exertion:    Short of breath when lying flat:    Irregular heart rhythm:        Vascular    Pain in calf, thigh, or hip brought on by ambulation:    Pain in feet at night that wakes you up from your sleep:     Blood clot in your veins:    Leg swelling:         Pulmonary    Oxygen at home:    Productive cough:     Wheezing:         Neurologic    Sudden weakness in arms or legs:     Sudden numbness in arms or legs:     Sudden onset of difficulty speaking or slurred speech:    Temporary loss of vision in one eye:     Problems with dizziness:         Gastrointestinal    Blood in stool:     Vomited blood:         Genitourinary    Burning when urinating:     Blood in urine:        Psychiatric    Major depression:         Hematologic    Bleeding problems:    Problems with blood clotting too easily:        Skin    Rashes or ulcers:        Constitutional    Fever or chills:      PHYSICAL EXAM:   Vitals:   03/26/18 0924 03/26/18 0929  BP: 139/71 (!) 143/73  Pulse: 62   Resp: 20   Temp: 97.7 F (36.5 C)   TempSrc: Oral   SpO2: 97%   Weight: 157 lb (71.2 kg)   Height: 5\' 7"  (1.702 m)     GENERAL: The patient is a well-nourished male, in no acute distress. The vital signs are documented above. CARDIAC: There is a regular rate and rhythm.  VASCULAR:  No carotid bruits.  Pedal pulses are not palpable. PULMONARY: Non-labored respirations MUSCULOSKELETAL: There are no major deformities or cyanosis.  0 NEUROLOGIC: No focal weakness or paresthesias are detected. SKIN: There are no ulcers or rashes noted. PSYCHIATRIC: The patient has a normal affect.  STUDIES:   I have ordered and reviewed his vascular lab studies with the following findings: Carotid duplex: 1-39% right ICA stenosis.  Greater  than 50% bilateral  external carotid stenosis  ABI: Right 0.96.  Left = 0.70 (0.72)  Maximum velocity within the left limb of the graft was 503 cm/s (547)  MEDICAL ISSUES:   Claudication: The patient symptoms remain stable.  Ultrasound findings today show no significant interval change.  He will follow-up in 6 months with repeat studies  Carotid duplex: Right carotid endarterectomy site is widely patent.  He has bilateral external carotid stenosis.  He will need imaging in 1 year.  I will order this at his next visit in 6 months.    Annamarie Major, MD Vascular and Vein Specialists of [facility] [phone] Pager [phone]

## 2018-03-28 ENCOUNTER — Other Ambulatory Visit: Payer: Self-pay

## 2018-03-28 DIAGNOSIS — I7409 Other arterial embolism and thrombosis of abdominal aorta: Secondary | ICD-10-CM

## 2018-03-28 DIAGNOSIS — I739 Peripheral vascular disease, unspecified: Secondary | ICD-10-CM

## 2018-09-10 ENCOUNTER — Encounter (HOSPITAL_COMMUNITY): Payer: Medicare Other

## 2018-09-10 ENCOUNTER — Ambulatory Visit: Payer: Medicare Other | Admitting: Surgery

## 2018-09-17 ENCOUNTER — Ambulatory Visit (HOSPITAL_COMMUNITY)
Admission: RE | Admit: 2018-09-17 | Discharge: 2018-09-17 | Disposition: A | Discharge status: Home / Self Care | Payer: Medicare Other | Source: Ambulatory Visit | Attending: Surgery | Admitting: Surgery

## 2018-09-17 ENCOUNTER — Ambulatory Visit (INDEPENDENT_AMBULATORY_CARE_PROVIDER_SITE_OTHER)
Admission: RE | Admit: 2018-09-17 | Discharge: 2018-09-17 | Disposition: A | Discharge status: Home / Self Care | Payer: Medicare Other | Source: Ambulatory Visit | Attending: Surgery | Admitting: Surgery

## 2018-09-17 ENCOUNTER — Ambulatory Visit (INDEPENDENT_AMBULATORY_CARE_PROVIDER_SITE_OTHER): Payer: Medicare Other | Admitting: Surgery

## 2018-09-17 ENCOUNTER — Encounter: Payer: Self-pay | Admitting: Surgery

## 2018-09-17 ENCOUNTER — Other Ambulatory Visit: Payer: Self-pay

## 2018-09-17 VITALS — BP 149/74 | HR 68 | Temp 97.0°F | Resp 16 | Ht 67.0 in | Wt 156.0 lb

## 2018-09-17 DIAGNOSIS — I739 Peripheral vascular disease, unspecified: Secondary | ICD-10-CM

## 2018-09-17 DIAGNOSIS — I6521 Occlusion and stenosis of right carotid artery: Secondary | ICD-10-CM | POA: Diagnosis not present

## 2018-09-17 DIAGNOSIS — I7409 Other arterial embolism and thrombosis of abdominal aorta: Secondary | ICD-10-CM | POA: Insufficient documentation

## 2018-09-17 DIAGNOSIS — I701 Atherosclerosis of renal artery: Secondary | ICD-10-CM

## 2018-09-17 NOTE — Progress Notes (Signed)
Vascular and Vein Specialist of [facility]  Patient name: Tyler [name] MRN: [id number] DOB: [birth year] Sex: male   REASON FOR VISIT:    Follow up  [place]:    The patient is back today for followup. He is a former patient of Dr. Amedeo Plenty. He is status post complex aortic reconstruction with an aortobifemoral bypass graft and bilateral renal artery bypass graft. He is also status post right femoral-popliteal bypass graft in 1995 and 2003. I performed a right carotid endarterectomy for asymptomatic stenosis. 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 He underwent angiography on 04/15/2014 to better define this. I found approximately 80% stenosis within the distal limb of the aortobifemoral graft. On 05/27/2014, he underwent drug coated balloon angioplasty of what appeared to be a stenosis within the limb of his aortobifemoral graft. There was a residual stenosis after angioplasty.  His symptoms remain stable with claudication at 100 yards.  He does not have any open wounds.   PAST MEDICAL HISTORY:   Past Medical History:  Diagnosis Date  . Cancer ([place])    kidney  . Carotid artery occlusion   . CHF (congestive heart failure) ([place])   . COPD (chronic obstructive pulmonary disease) ([place])   . Coronary artery disease   . DVT (deep venous thrombosis) ([place])   . Hyperlipidemia   . Hypertension   . Myocardial infarction ([place]) 2009  . Peripheral vascular disease ([place])   . Ulcer      FAMILY HISTORY:   Family History  Problem Relation Age of Onset  . Heart disease Father        Aneurysm, Abdominal Aortic  . Hypertension Other   . Coronary artery disease Other   . Alzheimer's disease Other   . Other Other        AAA  . Arthritis Other   . Diabetes Other   . Seizures Other     SOCIAL HISTORY:   Social History   Tobacco Use  . Smoking status: Former Smoker    Packs/day: 1.00    Years: 40.00   Pack years: 40.00    Types: Cigarettes    Last attempt to quit: 10/11/1999    Years since quitting: 18.9  . Smokeless tobacco: Never Used  Substance Use Topics  . Alcohol use: No    Alcohol/week: 0.0 standard drinks     ALLERGIES:   Allergies  Allergen Reactions  . Penicillins Swelling and Rash    Has patient had a PCN reaction causing immediate rash, facial/tongue/throat swelling, SOB or lightheadedness with hypotension: Yes Has patient had a PCN reaction causing severe rash involving mucus membranes or skin necrosis: No Has patient had a PCN reaction that required hospitalization No Has patient had a PCN reaction occurring within the last 10 years: No If all of the above answers are "NO", then may proceed with Cephalosporin use.      CURRENT MEDICATIONS:   Current Outpatient Medications  Medication Sig Dispense Refill  . aspirin EC 81 MG tablet Take 81 mg by mouth daily.      . carvedilol (COREG) 6.25 MG tablet Take 6.25 mg by mouth 2 (two) times daily with a meal.      . dicyclomine (BENTYL) 10 MG capsule Take 10 mg by mouth 4 (four) times daily -  before meals and at bedtime.    Marland Kitchen doxazosin (CARDURA) 4 MG tablet Take 4 mg by mouth at bedtime.      . furosemide (  LASIX) 40 MG tablet Take 40 mg by mouth every other day.    . levothyroxine (SYNTHROID, LEVOTHROID) 50 MCG tablet Take 50 mcg by mouth daily before breakfast.    . Multiple Vitamin (MULTIVITAMIN) capsule Take 1 capsule by mouth daily.      Marland Kitchen NIFEdipine (PROCARDIA XL/ADALAT-CC) 60 MG 24 hr tablet Take 60 mg by mouth 2 (two) times daily.      . nitroGLYCERIN (NITRODUR - DOSED IN MG/24 HR) 0.4 mg/hr patch Place 0.4 mg onto the skin daily.    Marland Kitchen omeprazole (PRILOSEC) 20 MG capsule Take 20 mg by mouth daily.      . simvastatin (ZOCOR) 20 MG tablet Take 20 mg by mouth at bedtime.       No current facility-administered medications for this visit.     REVIEW OF SYSTEMS:   [X]  denotes positive finding, [ ]  denotes  negative finding Cardiac  Comments:  Chest pain or chest pressure:    Shortness of breath upon exertion:    Short of breath when lying flat:    Irregular heart rhythm:        Vascular    Pain in calf, thigh, or hip brought on by ambulation: x   Pain in feet at night that wakes you up from your sleep:     Blood clot in your veins:    Leg swelling:         Pulmonary    Oxygen at home:    Productive cough:     Wheezing:         Neurologic    Sudden weakness in arms or legs:     Sudden numbness in arms or legs:     Sudden onset of difficulty speaking or slurred speech:    Temporary loss of vision in one eye:     Problems with dizziness:         Gastrointestinal    Blood in stool:     Vomited blood:         Genitourinary    Burning when urinating:     Blood in urine:        Psychiatric    Major depression:         Hematologic    Bleeding problems:    Problems with blood clotting too easily:        Skin    Rashes or ulcers:        Constitutional    Fever or chills:      PHYSICAL EXAM:   Vitals:   09/17/18 0946  BP: (!) 149/74  Pulse: 68  Resp: 16  Temp: (!) 97 F (36.1 C)  TempSrc: Oral  SpO2: 97%  Weight: 156 lb (70.8 kg)  Height: 5\' 7"  (1.702 m)    GENERAL: The patient is a well-nourished male, in no acute distress. The vital signs are documented above. CARDIAC: There is a regular rate and rhythm.  PULMONARY: Non-labored respirations.  MUSCULOSKELETAL: There are no major deformities or cyanosis. NEUROLOGIC: No focal weakness or paresthesias are detected. SKIN: There are no ulcers or rashes noted. PSYCHIATRIC: The patient has a normal affect.  STUDIES:   I have ordered and reviewed his vascular studies with the following findings:  +-------+-----------+-----------+------------+------------+ ABI/TBIToday's ABIToday's TBIPrevious ABIPrevious TBI +-------+-----------+-----------+------------+------------+ Right 0.99    0.69    0.96     0.76     +-------+-----------+-----------+------------+------------+ Left  0.74    0.51    0.7     0.51     +-------+-----------+-----------+------------+------------+  Stenosis in the left groin is 579 cm/s  MEDICAL ISSUES:   Claudication: The patient's remained stable.  There is been no significant changes on ultrasound today.  He will continue to follow-up in 3 months to see the nurse practitioner with a duplex.  As long as his symptoms remain stable, he will continue with follow-up.  I will see him back in 6 months.  Carotid duplex: His right carotid endarterectomy site has been widely patent.  He will need a follow-up ultrasound in 6-12 months.    Annamarie Major, MD Vascular and Vein Specialists of [facility] [phone] Pager [phone]

## 2018-12-11 ENCOUNTER — Other Ambulatory Visit: Payer: Self-pay

## 2018-12-11 DIAGNOSIS — I739 Peripheral vascular disease, unspecified: Secondary | ICD-10-CM

## 2018-12-11 DIAGNOSIS — I7409 Other arterial embolism and thrombosis of abdominal aorta: Secondary | ICD-10-CM

## 2018-12-17 ENCOUNTER — Other Ambulatory Visit: Payer: Self-pay

## 2018-12-17 ENCOUNTER — Ambulatory Visit (INDEPENDENT_AMBULATORY_CARE_PROVIDER_SITE_OTHER): Payer: Medicare Other | Admitting: Family

## 2018-12-17 ENCOUNTER — Ambulatory Visit (HOSPITAL_COMMUNITY)
Admission: RE | Admit: 2018-12-17 | Discharge: 2018-12-17 | Disposition: A | Discharge status: Home / Self Care | Payer: Medicare Other | Source: Ambulatory Visit | Attending: Surgery | Admitting: Surgery

## 2018-12-17 ENCOUNTER — Encounter: Payer: Self-pay | Admitting: Family

## 2018-12-17 ENCOUNTER — Ambulatory Visit (INDEPENDENT_AMBULATORY_CARE_PROVIDER_SITE_OTHER)
Admission: RE | Admit: 2018-12-17 | Discharge: 2018-12-17 | Disposition: A | Discharge status: Home / Self Care | Payer: Medicare Other | Source: Ambulatory Visit | Attending: Surgery | Admitting: Surgery

## 2018-12-17 VITALS — BP 132/67 | HR 60 | Temp 97.3°F | Resp 16 | Ht 67.0 in | Wt 161.4 lb

## 2018-12-17 DIAGNOSIS — I701 Atherosclerosis of renal artery: Secondary | ICD-10-CM

## 2018-12-17 DIAGNOSIS — I739 Peripheral vascular disease, unspecified: Secondary | ICD-10-CM | POA: Diagnosis not present

## 2018-12-17 DIAGNOSIS — I7409 Other arterial embolism and thrombosis of abdominal aorta: Secondary | ICD-10-CM | POA: Diagnosis present

## 2018-12-17 DIAGNOSIS — I6523 Occlusion and stenosis of bilateral carotid arteries: Secondary | ICD-10-CM

## 2018-12-17 DIAGNOSIS — I779 Disorder of arteries and arterioles, unspecified: Secondary | ICD-10-CM | POA: Diagnosis not present

## 2018-12-17 NOTE — Patient Instructions (Signed)
Peripheral Vascular Disease  Peripheral vascular disease (PVD) is a disease of the blood vessels that are not part of your heart and brain. A simple term for PVD is poor circulation. In most cases, PVD narrows the blood vessels that carry blood from your heart to the rest of your body. This can reduce the supply of blood to your arms, legs, and internal organs, like your stomach or kidneys. However, PVD most often affects a person's lower legs and feet. Without treatment, PVD tends to get worse. PVD can also lead to acute ischemic limb. This is when an arm or leg suddenly cannot get enough blood. This is a medical emergency. Follow these instructions at home: Lifestyle  Do not use any products that contain nicotine or tobacco, such as cigarettes and e-cigarettes. If you need help quitting, ask your doctor.  Lose weight if you are overweight. Or, stay at a healthy weight as told by your doctor.  Eat a diet that is low in fat and cholesterol. If you need help, ask your doctor.  Exercise regularly. Ask your doctor for activities that are right for you. General instructions  Take over-the-counter and prescription medicines only as told by your doctor.  Take good care of your feet: ? Wear comfortable shoes that fit well. ? Check your feet often for any cuts or sores.  Keep all follow-up visits as told by your doctor This is important. Contact a doctor if:  You have cramps in your legs when you walk.  You have leg pain when you are at rest.  You have coldness in a leg or foot.  Your skin changes.  You are unable to get or have an erection (erectile dysfunction).  You have cuts or sores on your feet that do not heal. Get help right away if:  Your arm or leg turns cold, numb, and blue.  Your arms or legs become red, warm, swollen, painful, or numb.  You have chest pain.  You have trouble breathing.  You suddenly have weakness in your face, arm, or leg.  You become very  confused or you cannot speak.  You suddenly have a very bad headache.  You suddenly cannot see. Summary  Peripheral vascular disease (PVD) is a disease of the blood vessels.  A simple term for PVD is poor circulation. Without treatment, PVD tends to get worse.  Treatment may include exercise, low fat and low cholesterol diet, and quitting smoking. This information is not intended to replace advice given to you by your health care provider. Make sure you discuss any questions you have with your health care provider. Document Released: 12/21/2009 Document Revised: 11/03/2016 Document Reviewed: 11/03/2016 Elsevier Interactive Patient Education  2019 [employer]     Stroke Prevention Some medical conditions and lifestyle choices can lead to a higher risk for a stroke. You can help to prevent a stroke by making nutrition, lifestyle, and other changes. What nutrition changes can be made?   Eat healthy foods. ? Choose foods that are high in fiber. These include:  Fresh fruits.  Fresh vegetables.  Whole grains. ? Eat at least 5 or more servings of fruits and vegetables each day. Try to fill half of your plate at each meal with fruits and vegetables. ? Choose lean protein foods. These include:  Lowfat (lean) cuts of meat.  Chicken without skin.  Fish.  Tofu.  Beans.  Nuts. ? Eat low-fat dairy products. ? Avoid foods that:  Are high in salt (sodium).    Have saturated fat.  Have trans fat.  Have cholesterol.  Are processed.  Are premade.  Follow eating guidelines as told by your doctor. These may include: ? Reducing how many calories you eat and drink each day. ? Limiting how much salt you eat or drink each day to 1,500 milligrams (mg). ? Using only healthy fats for cooking. These include:  Olive oil.  Canola oil.  Sunflower oil. ? Counting how many carbohydrates you eat and drink each day. What lifestyle changes can be made?  Try to stay at a healthy  weight. Talk to your doctor about what a good weight is for you.  Get at least 30 minutes of moderate physical activity at least 5 days a week. This can include: ? Fast walking. ? Biking. ? Swimming.  Do not use any products that have nicotine or tobacco. This includes cigarettes and e-cigarettes. If you need help quitting, ask your doctor. Avoid being around tobacco smoke in general.  Limit how much alcohol you drink to no more than 1 drink a day for nonpregnant women and 2 drinks a day for men. One drink equals 12 oz of beer, 5 oz of wine, or 1 oz of hard liquor.  Do not use drugs.  Avoid taking birth control pills. Talk to your doctor about the risks of taking birth control pills if: ? You are over 35 years old. ? You smoke. ? You get migraines. ? You have had a blood clot. What other changes can be made?  Manage your cholesterol. ? It is important to eat a healthy diet. ? If your cholesterol cannot be managed through your diet, you may also need to take medicines. Take medicines as told by your doctor.  Manage your diabetes. ? It is important to eat a healthy diet and to exercise regularly. ? If your blood sugar cannot be managed through diet and exercise, you may need to take medicines. Take medicines as told by your doctor.  Control your high blood pressure (hypertension). ? Try to keep your blood pressure below 130/80. This can help lower your risk of stroke. ? It is important to eat a healthy diet and to exercise regularly. ? If your blood pressure cannot be managed through diet and exercise, you may need to take medicines. Take medicines as told by your doctor. ? Ask your doctor if you should check your blood pressure at home. ? Have your blood pressure checked every year. Do this even if your blood pressure is normal.  Talk to your doctor about getting checked for a sleep disorder. Signs of this can include: ? Snoring a lot. ? Feeling very tired.  Take  over-the-counter and prescription medicines only as told by your doctor. These may include aspirin or blood thinners (antiplatelets or anticoagulants).  Make sure that any other medical conditions you have are managed. Where to find more information  American Stroke Association: www.strokeassociation.org  National Stroke Association: www.stroke.org Get help right away if:  You have any symptoms of stroke. "BE FAST" is an easy way to remember the main warning signs: ? B - Balance. Signs are dizziness, sudden trouble walking, or loss of balance. ? E - Eyes. Signs are trouble seeing or a sudden change in how you see. ? F - Face. Signs are sudden weakness or loss of feeling of the face, or the face or eyelid drooping on one side. ? A - Arms. Signs are weakness or loss of feeling in an arm. This happens   suddenly and usually on one side of the body. ? S - Speech. Signs are sudden trouble speaking, slurred speech, or trouble understanding what people say. ? T - Time. Time to call emergency services. Write down what time symptoms started.  You have other signs of stroke, such as: ? A sudden, very bad headache with no known cause. ? Feeling sick to your stomach (nausea). ? Throwing up (vomiting). ? Jerky movements you cannot control (seizure). These symptoms may represent a serious problem that is an emergency. Do not wait to see if the symptoms will go away. Get medical help right away. Call your local emergency services (911 in the [place] Do not drive yourself to the hospital. Summary  You can prevent a stroke by eating healthy, exercising, not smoking, drinking less alcohol, and treating other health problems, such as diabetes, high blood pressure, or high cholesterol.  Do not use any products that contain nicotine or tobacco, such as cigarettes and e-cigarettes.  Get help right away if you have any signs or symptoms of a stroke. This information is not intended to replace advice given to  you by your health care provider. Make sure you discuss any questions you have with your health care provider. Document Released: 03/27/2012 Document Revised: 12/28/2016 Document Reviewed: 12/28/2016 Elsevier Interactive Patient Education  2019 Elsevier Inc.  

## 2018-12-17 NOTE — Progress Notes (Signed)
[facility] [place]   CC: Follow up peripheral artery occlusive disease  History of Present Illness Tyler [name] is a 85 y.o. male who is s/p right CEA in 07/17/09 by Dr. Trula Slade, Aortobifemoral bypass grafting and bilateral renal artery bypass graft in1995, and right femoral to above knee popliteal artery bypass using vein graft in 1995 and 2003 by Dr. Amedeo Plenty.  He then had an angioplasty of the left limb of the aorto-bi-iliac bypass graft on 10/16/15. He returns today for follow up.   He has a tired feeling in his bilateral buttocks after walking about a hundred yards, relieved by rest, he denies claudication symptoms in thighs or calves; this has been stable since his last visit.     Pt states he has a history of kidney cancer with most of one of his kidneys resected for this.  He walks about 10-15 minutes total per day, claudication in both hips is about the same.   He denies any known hx of stroke or TIA. He denies abdominal or back pain. He denies non healing wounds in his feet or legs.   Dr. Trula Slade last evaluated pt on 09-17-18. At that time the patient's claudication sx's remained stable.  There was no significant changes on ultrasound that day.  He was to continue to follow-up in 3 months to see the nurse practitioner with a duplex.  As long as his symptoms remain stable, he will continue with follow-up.  Dr. Trula Slade to see him back in 6 months. Carotid duplex: His right carotid endarterectomy site was widely patent.  He will need a follow-up ultrasound in 6-12 months.  Diabetic: No Tobacco use: former smoker, quit in 2001  Pt meds include: Statin :Yes Betablocker: Yes ASA: Yes Other anticoagulants/antiplatelets: no  Past Medical History:  Diagnosis Date  . Cancer ([place])    kidney  . Carotid artery occlusion   . CHF (congestive heart failure) ([place])   . COPD (chronic obstructive pulmonary disease) ([place])   . Coronary artery disease   . DVT (deep  venous thrombosis) ([place])   . Hyperlipidemia   . Hypertension   . Myocardial infarction ([place]) 2009  . Peripheral vascular disease ([place])   . Ulcer     Social History Social History   Tobacco Use  . Smoking status: Former Smoker    Packs/day: 1.00    Years: 40.00    Pack years: 40.00    Types: Cigarettes    Last attempt to quit: 10/11/1999    Years since quitting: 19.1  . Smokeless tobacco: Never Used  Substance Use Topics  . Alcohol use: No    Alcohol/week: 0.0 standard drinks  . Drug use: No    Family History Family History  Problem Relation Age of Onset  . Heart disease Father        Aneurysm, Abdominal Aortic  . Hypertension Other   . Coronary artery disease Other   . Alzheimer's disease Other   . Other Other        AAA  . Arthritis Other   . Diabetes Other   . Seizures Other     Past Surgical History:  Procedure Laterality Date  . ABDOMINAL AORTAGRAM N/A 04/15/2014   Procedure: ABDOMINAL Maxcine Ham;  Surgeon: Serafina Mitchell, MD;  Location: [facility] CATH LAB;  Service: Cardiovascular;  Laterality: N/A;  . AORTIC ARCH REPAIR  1995  . CAROTID ENDARTERECTOMY  07/17/2009   right  . FEMORAL-POPLITEAL BYPASS GRAFT  1995 and 2003  .  KIDNEY SURGERY     resection of right renal mass  . PERIPHERAL VASCULAR CATHETERIZATION N/A 10/13/2015   Procedure: Abdominal Aortogram;  Surgeon: Serafina Mitchell, MD;  Location: [facility];  Service: Cardiovascular;  Laterality: N/A;  . PERIPHERAL VASCULAR CATHETERIZATION Left 10/13/2015   Procedure: Peripheral Vascular Balloon Angioplasty;  Surgeon: Serafina Mitchell, MD;  Location: [facility];  Service: Cardiovascular;  Laterality: Left;  Left femoral limb of  of AO BI femoral graft  . PR VEIN BYPASS GRAFT,AORTO-FEM-POP      Allergies  Allergen Reactions  . Penicillins Swelling and Rash    Has patient had a PCN reaction causing immediate rash, facial/tongue/throat swelling, SOB or lightheadedness with hypotension: Yes Has patient  had a PCN reaction causing severe rash involving mucus membranes or skin necrosis: No Has patient had a PCN reaction that required hospitalization No Has patient had a PCN reaction occurring within the last 10 years: No If all of the above answers are "NO", then may proceed with Cephalosporin use.     Current Outpatient Medications  Medication Sig Dispense Refill  . aspirin EC 81 MG tablet Take 81 mg by mouth daily.      . carvedilol (COREG) 6.25 MG tablet Take 6.25 mg by mouth 2 (two) times daily with a meal.      . dicyclomine (BENTYL) 10 MG capsule Take 10 mg by mouth 4 (four) times daily -  before meals and at bedtime.    Marland Kitchen doxazosin (CARDURA) 4 MG tablet Take 4 mg by mouth at bedtime.      . furosemide (LASIX) 40 MG tablet Take 40 mg by mouth every other day.    . levothyroxine (SYNTHROID, LEVOTHROID) 50 MCG tablet Take 50 mcg by mouth daily before breakfast.    . Multiple Vitamin (MULTIVITAMIN) capsule Take 1 capsule by mouth daily.      Marland Kitchen NIFEdipine (PROCARDIA XL/ADALAT-CC) 60 MG 24 hr tablet Take 60 mg by mouth 2 (two) times daily.      . nitroGLYCERIN (NITRODUR - DOSED IN MG/24 HR) 0.4 mg/hr patch Place 0.4 mg onto the skin daily.    Marland Kitchen omeprazole (PRILOSEC) 20 MG capsule Take 20 mg by mouth daily.      . simvastatin (ZOCOR) 20 MG tablet Take 20 mg by mouth at bedtime.       No current facility-administered medications for this visit.     ROS: See HPI for pertinent positives and negatives.   Physical Examination  Vitals:   12/17/18 1020 12/17/18 1023  BP: 123/63 132/67  Pulse: 60   Resp: 16   Temp: (!) 97.3 F (36.3 C)   TempSrc: Oral   SpO2: 95%   Weight: 161 lb 6.4 oz (73.2 kg)   Height: 5\' 7"  (1.702 m)    Body mass index is 25.28 kg/m.  General: A&O x 3, WDWN, elderly male. Gait: normal HENT: No gross abnormalities.  Eyes: PERRLA. Pulmonary: Respirations are non labored, CTAB, good air movement in all fields Cardiac: regular rhythm, no detected murmur.          Carotid Bruits Right Left   Negative Negative   Radial pulses are 2+ palpable bilaterally   Adominal aortic pulse is not palpable                         VASCULAR EXAM: Extremities without ischemic changes, without Gangrene; without open wounds.  LE Pulses Right Left       FEMORAL  2+ palpable  1+ palpable        POPLITEAL  not palpable   not palpable       POSTERIOR TIBIAL  not palpable   not palpable        DORSALIS PEDIS      ANTERIOR TIBIAL 1+ palpable  not palpable    Abdomen: soft, NT, small midline reducible asymptomatic incisional hernia. Skin: no rashes, no cellulitis, no ulcers noted. Musculoskeletal: no muscle wasting or atrophy. Mild kyphosis of the cervical and thoracic spine. Neurologic: A&O X 3; appropriate affect, Sensation is normal; MOTOR FUNCTION:  moving all extremities equally, motor strength 5/5 throughout. Speech is fluent/normal. CN 2-12 intact except with mild hearing loss. Psychiatric: Thought content is normal, mood appropriate for clinical situation.    ASSESSMENT: Tyler [name] is a 85 y.o. male who is s/p right CEA in 07/17/09 by Dr. Trula Slade, Aortobifemoral bypass grafting and bilateral renal artery bypass graft in1995, and right femoral to above knee popliteal artery bypass using vein graft in 1995 and 2003 by Dr. Amedeo Plenty.  He then had an angioplasty of the left limb of the aorto-bi-iliac bypass graft on 10/16/15.   There are no signs of ischemia in his feet or legs.  His hip claudication remains stable.   DATA  Aortoiliac duplex (12-17-18): Abdominal Aorta Findings: +--------+-------+----------+----------+--------+--------+--------+ LocationAP (cm)Trans (cm)PSV (cm/s)WaveformThrombusComments +--------+-------+----------+----------+--------+--------+--------+ Distal                   58                                  +--------+-------+----------+----------+--------+--------+--------+    Right Graft #1: +------------------+--------+--------+----------+--------+ Right limb AFBG   PSV cm/sStenosisWaveform  Comments +------------------+--------+--------+----------+--------+ Inflow            51                                 +------------------+--------+--------+----------+--------+ Prox Anastomosis  33              triphasic          +------------------+--------+--------+----------+--------+ Proximal Graft    38              monophasic         +------------------+--------+--------+----------+--------+ Mid Graft         45              monophasic         +------------------+--------+--------+----------+--------+ Distal Graft      85              biphasic           +------------------+--------+--------+----------+--------+ Distal Anastomosis90              monophasic         +------------------+--------+--------+----------+--------+ Outflow           176             biphasic           +------------------+--------+--------+----------+--------+  Left Graft #1: +--------------------+--------+--------+----------+--------+ Left limb AFBG      PSV cm/sStenosisWaveform  Comments +--------------------+--------+--------+----------+--------+ Inflow              51              monophasic         +--------------------+--------+--------+----------+--------+  Proximal Anastomosis33              triphasic          +--------------------+--------+--------+----------+--------+ Proximal Graft      56              monophasic         +--------------------+--------+--------+----------+--------+ Mid Graft           61              monophasic         +--------------------+--------+--------+----------+--------+ Distal Graft        58              monophasic         +--------------------+--------+--------+----------+--------+ Distal  Anastasis    444             monophasic         +--------------------+--------+--------+----------+--------+ Outflow             91              monophasic         +--------------------+--------+--------+----------+--------+    Summary: Abdominal Aorta: Right limb AFBG bypass graft patent with no evidence of stenosis noted. Left limb AFBG bypass graft visualized with a >70% stenosis noted. Improved from over 500 PSV at distal anastomosis on 09-17-18.    ABI (Date: 12/17/2018):  ABI Findings: +---------+------------------+-----+---------+--------+ Right    Rt Pressure (mmHg)IndexWaveform Comment  +---------+------------------+-----+---------+--------+ Brachial 143                                      +---------+------------------+-----+---------+--------+ ATA      130               0.91 biphasic          +---------+------------------+-----+---------+--------+ PTA      147               1.03 triphasic         +---------+------------------+-----+---------+--------+ Great Toe92                0.64                   +---------+------------------+-----+---------+--------+  +---------+------------------+-----+----------+-------+ Left     Lt Pressure (mmHg)IndexWaveform  Comment +---------+------------------+-----+----------+-------+ Brachial 139                                      +---------+------------------+-----+----------+-------+ ATA      112               0.78 monophasic        +---------+------------------+-----+----------+-------+ PTA      99                0.69 monophasic        +---------+------------------+-----+----------+-------+ Great Toe51                0.36                   +---------+------------------+-----+----------+-------+  +-------+-----------+-----------+------------+------------+ ABI/TBIToday's ABIToday's TBIPrevious ABIPrevious  TBI +-------+-----------+-----------+------------+------------+ Right  1.03       0.64       0.99        0.69         +-------+-----------+-----------+------------+------------+ Left   0.78       0.36  0.74        0.51         +-------+-----------+-----------+------------+------------+  Bilateral ABIs appear essentially unchanged compared to prior study on 09/17/2018.   Summary: Right: Resting right ankle-brachial index is within normal range. No evidence of significant right lower extremity arterial disease. The right toe-brachial index is abnormal.  Left: Resting left ankle-brachial index indicates moderate left lower extremity arterial disease. The left toe-brachial index is abnormal. LT Great toe pressure = 51 mmHg.    PLAN:  Based on the patient's vascular studies and examination, pt will return to clinic in 3 months with aortoiliac duplex, ABI's, and carotid duplex. Pt states he prefers to see Dr. Trula Slade at the next visit.   I discussed in depth with the patient the nature of atherosclerosis, and emphasized the importance of maximal medical management including strict control of blood pressure, blood glucose, and lipid levels, obtaining regular exercise, and continued cessation of smoking.  The patient is aware that without maximal medical management the underlying atherosclerotic disease process will progress, limiting the benefit of any interventions.  The patient was given information about PAD including signs, symptoms, treatment, what symptoms should prompt the patient to seek immediate medical care, and risk reduction measures to take.  Clemon Chambers, RN, MSN, FNP-C [facility] Phone: [phone]  Clinic MD: Oneida Alar on call  12/17/18 10:37 AM

## 2019-02-23 ENCOUNTER — Encounter (HOSPITAL_COMMUNITY): Payer: Self-pay | Admitting: *Deleted

## 2019-02-23 ENCOUNTER — Observation Stay (HOSPITAL_COMMUNITY)
Admission: EM | Admit: 2019-02-23 | Discharge: 2019-02-24 | Disposition: A | Discharge status: Home / Self Care | Payer: Medicare Other | Attending: Student in an Organized Health Care Education/Training Program | Admitting: Student in an Organized Health Care Education/Training Program

## 2019-02-23 ENCOUNTER — Other Ambulatory Visit: Payer: Self-pay

## 2019-02-23 DIAGNOSIS — Z79890 Hormone replacement therapy (postmenopausal): Secondary | ICD-10-CM | POA: Diagnosis not present

## 2019-02-23 DIAGNOSIS — Z905 Acquired absence of kidney: Secondary | ICD-10-CM | POA: Insufficient documentation

## 2019-02-23 DIAGNOSIS — E785 Hyperlipidemia, unspecified: Secondary | ICD-10-CM | POA: Diagnosis not present

## 2019-02-23 DIAGNOSIS — Z7982 Long term (current) use of aspirin: Secondary | ICD-10-CM | POA: Diagnosis not present

## 2019-02-23 DIAGNOSIS — I509 Heart failure, unspecified: Secondary | ICD-10-CM | POA: Diagnosis not present

## 2019-02-23 DIAGNOSIS — Z86718 Personal history of other venous thrombosis and embolism: Secondary | ICD-10-CM | POA: Insufficient documentation

## 2019-02-23 DIAGNOSIS — N401 Enlarged prostate with lower urinary tract symptoms: Secondary | ICD-10-CM | POA: Diagnosis not present

## 2019-02-23 DIAGNOSIS — I10 Essential (primary) hypertension: Secondary | ICD-10-CM

## 2019-02-23 DIAGNOSIS — Z880 Allergy status to penicillin: Secondary | ICD-10-CM | POA: Insufficient documentation

## 2019-02-23 DIAGNOSIS — N183 Chronic kidney disease, stage 3 (moderate): Secondary | ICD-10-CM | POA: Diagnosis not present

## 2019-02-23 DIAGNOSIS — Z1159 Encounter for screening for other viral diseases: Secondary | ICD-10-CM | POA: Insufficient documentation

## 2019-02-23 DIAGNOSIS — I739 Peripheral vascular disease, unspecified: Secondary | ICD-10-CM | POA: Insufficient documentation

## 2019-02-23 DIAGNOSIS — I252 Old myocardial infarction: Secondary | ICD-10-CM | POA: Diagnosis not present

## 2019-02-23 DIAGNOSIS — Z888 Allergy status to other drugs, medicaments and biological substances status: Secondary | ICD-10-CM | POA: Insufficient documentation

## 2019-02-23 DIAGNOSIS — F419 Anxiety disorder, unspecified: Secondary | ICD-10-CM | POA: Diagnosis not present

## 2019-02-23 DIAGNOSIS — I130 Hypertensive heart and chronic kidney disease with heart failure and stage 1 through stage 4 chronic kidney disease, or unspecified chronic kidney disease: Principal | ICD-10-CM | POA: Insufficient documentation

## 2019-02-23 DIAGNOSIS — Z87891 Personal history of nicotine dependence: Secondary | ICD-10-CM | POA: Insufficient documentation

## 2019-02-23 DIAGNOSIS — Z85528 Personal history of other malignant neoplasm of kidney: Secondary | ICD-10-CM | POA: Diagnosis not present

## 2019-02-23 DIAGNOSIS — E039 Hypothyroidism, unspecified: Secondary | ICD-10-CM | POA: Diagnosis not present

## 2019-02-23 DIAGNOSIS — J449 Chronic obstructive pulmonary disease, unspecified: Secondary | ICD-10-CM | POA: Diagnosis not present

## 2019-02-23 DIAGNOSIS — Z8249 Family history of ischemic heart disease and other diseases of the circulatory system: Secondary | ICD-10-CM | POA: Diagnosis not present

## 2019-02-23 DIAGNOSIS — Z79899 Other long term (current) drug therapy: Secondary | ICD-10-CM | POA: Diagnosis not present

## 2019-02-23 DIAGNOSIS — I2510 Atherosclerotic heart disease of native coronary artery without angina pectoris: Secondary | ICD-10-CM | POA: Diagnosis not present

## 2019-02-23 DIAGNOSIS — N189 Chronic kidney disease, unspecified: Secondary | ICD-10-CM

## 2019-02-23 LAB — URINALYSIS, ROUTINE W REFLEX MICROSCOPIC
Bilirubin Urine: NEGATIVE
Glucose, UA: NEGATIVE mg/dL
Hgb urine dipstick: NEGATIVE
Ketones, ur: 5 mg/dL — AB
Nitrite: NEGATIVE
Protein, ur: NEGATIVE mg/dL
Specific Gravity, Urine: 1.003 — ABNORMAL LOW (ref 1.005–1.030)
pH: 6 (ref 5.0–8.0)

## 2019-02-23 LAB — CBC
HCT: 40.5 % (ref 39.0–52.0)
Hemoglobin: 13.8 g/dL (ref 13.0–17.0)
MCH: 30.5 pg (ref 26.0–34.0)
MCHC: 34.1 g/dL (ref 30.0–36.0)
MCV: 89.6 fL (ref 80.0–100.0)
Platelets: 178 10*3/uL (ref 150–400)
RBC: 4.52 MIL/uL (ref 4.22–5.81)
RDW: 13.2 % (ref 11.5–15.5)
WBC: 6.4 10*3/uL (ref 4.0–10.5)
nRBC: 0 % (ref 0.0–0.2)

## 2019-02-23 LAB — BASIC METABOLIC PANEL
Anion gap: 10 (ref 5–15)
BUN: 17 mg/dL (ref 8–23)
CO2: 24 mmol/L (ref 22–32)
Calcium: 10 mg/dL (ref 8.9–10.3)
Chloride: 102 mmol/L (ref 98–111)
Creatinine, Ser: 1.49 mg/dL — ABNORMAL HIGH (ref 0.61–1.24)
GFR calc Af Amer: 48 mL/min — ABNORMAL LOW (ref >60)
GFR calc non Af Amer: 42 mL/min — ABNORMAL LOW (ref >60)
Glucose, Bld: 105 mg/dL — ABNORMAL HIGH (ref 70–99)
Potassium: 4.1 mmol/L (ref 3.5–5.1)
Sodium: 136 mmol/L (ref 135–145)

## 2019-02-23 MED ORDER — SODIUM CHLORIDE 0.9 % IV SOLN: 1.0000 g | Freq: Once | INTRAVENOUS | Status: DC

## 2019-02-23 MED ORDER — CARVEDILOL 12.5 MG PO TABS
12.5000 mg | ORAL_TABLET | Freq: Two times a day (BID) | ORAL | Status: DC
  Administered 2019-02-24: 12.5 mg via ORAL
  Filled 2019-02-23: qty 1

## 2019-02-23 MED ORDER — DOXAZOSIN MESYLATE 8 MG PO TABS
8.0000 mg | ORAL_TABLET | Freq: Every day | ORAL | Status: DC
  Administered 2019-02-24: 8 mg via ORAL
  Filled 2019-02-23 (×2): qty 1

## 2019-02-23 NOTE — ED Triage Notes (Signed)
Pt says his blood pressure has been "high, around 185" and then it drops "low around 108". Pt is concerned he may be taking too many blood pressure medications.  Also, feels like his heart rate gets elevated at times. No pain, no SOB, no dizziness.

## 2019-02-23 NOTE — H&P (Signed)
Date: 02/24/2019               Patient Name:  Tyler [name] MRN: [id number]  DOB: 06/24/1929 Age / Sex: 85 y.o., male   PCP: Wayland Salinas, MD         Medical Service: Internal Medicine Teaching Service         Attending Physician: Dr. Evette Doffing, Mallie Mussel, *    First Contact: Dr. Truman Hayward Pager: [phone]  Second Contact: Dr. Frederico Hamman Pager: [phone]       After Hours (After 5p/  First Contact Pager: [phone]  weekends / holidays): Second Contact Pager: [phone]   Chief Complaint: uncontrolled blood pressure  History of Present Illness: Tyler [name] is a very pleasant 85 year old man with CKD3, HLD, BPH, PVD, hypothyroidism, hypertension, h/o right nephrectomy in 1995 for renal cancer presenting for evaluation of blood pressure. He is concerned that his BP fluctuates too much. He has seen his PCP for similar concerns, last visit was a telehealth visit on May 11th but no medication changes were made. Since that telehealth visit, he states that his blood pressure has fluctuated more than usual. He reports SBPs ranging from 90 to 180 and endorses occasional symptomatic hypotension as well as orthostatic hypotension. He checks his blood pressure at home whenever he feels symptoms such as light headedness or palpitations. He reports increased anxiety due to not being able to see his doctor in person recently given the covid restrictions. He is also the primary care giver for his wife.   His home BP regiment includes carvedilol, Cardura, Lasix every other day and then alternating his nifedipine 60 mg in the morning and 30 mg at night. However, the morning prior to admission, his home BP was 810 systolic so he didn't take his morning BP meds because he was worried they would be too strong. He then checked his BP 4 hours late and it was 175 systolic so he put on a nitro patch.    He denies chest pain, sob, h/a, vision changes, abdominal pain, cough, fevers. He does report urinary frequency related  to his BPH. He denies dysuria.    Meds:  Current Meds  Medication Sig  . aspirin EC 81 MG tablet Take 81 mg by mouth daily.    . Carboxymethylcellulose Sod PF 0.25 % SOLN Place 1-2 drops into both eyes 3 (three) times daily as needed (for dryness).  . carvedilol (COREG) 12.5 MG tablet Take 6.25 mg by mouth 2 (two) times daily with a meal.  . dicyclomine (BENTYL) 10 MG capsule Take 20 mg by mouth 2 (two) times a day.   . doxazosin (CARDURA) 8 MG tablet Take 4 mg by mouth 2 (two) times daily.  . furosemide (LASIX) 40 MG tablet Take 40 mg by mouth every other day.  . levothyroxine (SYNTHROID) 75 MCG tablet Take 75 mcg by mouth daily before breakfast.  . Multiple Vitamins-Minerals (ONE-A-DAY MENS 50+ ADVANTAGE) TABS Take 1 tablet by mouth daily with breakfast.  . NIFEdipine (PROCARDIA XL/ADALAT-CC) 60 MG 24 hr tablet Take 60 mg by mouth 2 (two) times a day.   . nitroGLYCERIN (NITRODUR - DOSED IN MG/24 HR) 0.4 mg/hr patch Place 0.4 mg onto the skin every other day.   Marland Kitchen omeprazole (PRILOSEC) 20 MG capsule Take 20 mg by mouth at bedtime.   . simvastatin (ZOCOR) 20 MG tablet Take 20 mg by mouth at bedtime.       Allergies: Allergies as of  02/23/2019 - Review Complete 02/23/2019  Allergen Reaction Noted  . Penicillins Swelling and Rash 09/07/2011  . Adhesive [tape] Other (See Comments) 02/23/2019   Past Medical History:  Diagnosis Date  . Cancer ([place])    kidney  . Carotid artery occlusion   . CHF (congestive heart failure) ([place])   . COPD (chronic obstructive pulmonary disease) ([place])   . Coronary artery disease   . DVT (deep venous thrombosis) ([place])   . Hyperlipidemia   . Hypertension   . Myocardial infarction ([place]) 2009  . Peripheral vascular disease ([place])   . Ulcer     Family History:  Family History  Problem Relation Age of Onset  . Heart disease Father        Aneurysm, Abdominal Aortic  . Hypertension Other   . Coronary artery disease Other   . Alzheimer's disease Other    . Other Other        AAA  . Arthritis Other   . Diabetes Other   . Seizures Other      Social History: Retired Art gallery manager. Married. Lives at home with his wife who is in poorer health than he. They are able to do all daily activities independently and have a daughter in [place] and two sons in [place] who help when needed. He used to smoke but quite 28yrs ago. Denies alcohol use.   Review of Systems: A complete ROS was negative except as per HPI.   Physical Exam: Blood pressure (!) 152/95, pulse 67, temperature 97.8 F (36.6 C), temperature source Oral, resp. rate 17, SpO2 99 %. Gen: comfortable, elderly male HENT: no thyromegaly  Cardiac: irregular, normal rate, no m/r/g, no JVD Pulm: CTAB, normal wob on RA Abd: soft, NT, ND, +BS Ext: warm, no LEE, strong DP pulses bilaterally  Neuro: CN 2-12 grossly intact, moving all extremities, A&O x 4.   Labs: Creatine 1.49 (at baseline) Cbc unremarkable  UA: leukocytes, many bacteria, 21-50 wbc  EKG: personally reviewed my interpretation is HR 66, irregular, no ischemic changes    Assessment & Plan by Problem: Active Problems:   HTN (hypertension)  Tyler [name] is a very pleasant 85 year old man with CKD3, HLD, BPH, PVD, hypothyroidism, hypertension, h/o right nephrectomy in 1995 for renal cancer presenting for evaluation of asymptomatic HTN.  HTN: SBP 137-171, asymptomatic, has not taken any BP meds today. Unable to see PCP in person due to covid restrictions. Notes increased anxiety because of this. No signs or symptoms of hypertensive urgency nor symptoms of hypotension. Renal function is at baseline. He follows closely with PCP and nephrology. Unclear why he is on nifedipine XL bid instead of qd but I am hesitant to change this as he follows closely with his outpt providers.  - continue home meds: coreg 12.5 bid, doxazosin 8mg  qd, nifedipine 60mg  bid - euvolemic on exam, hold home lasix  - tele   Hypothyroidism: endorses daily  palpitations and anxiety. Denies heat or cold intolerance, diarrhea, constipation. - continue home levothyroxine 58mcg daily  - TSH  BPH: home doxazosin   PVD, HLD: home simvastatin, baby asa   DVT ppx: lovenox Diet: HH Code: full, confirmed with patient   Dispo: Admit patient to Observation with expected length of stay less than 2 midnights.  Signed: Isabelle Course, MD 02/24/2019, 12:43 AM  Pager: [phone]

## 2019-02-24 DIAGNOSIS — I130 Hypertensive heart and chronic kidney disease with heart failure and stage 1 through stage 4 chronic kidney disease, or unspecified chronic kidney disease: Secondary | ICD-10-CM | POA: Diagnosis not present

## 2019-02-24 DIAGNOSIS — Z880 Allergy status to penicillin: Secondary | ICD-10-CM

## 2019-02-24 DIAGNOSIS — E039 Hypothyroidism, unspecified: Secondary | ICD-10-CM | POA: Diagnosis not present

## 2019-02-24 DIAGNOSIS — N183 Chronic kidney disease, stage 3 (moderate): Secondary | ICD-10-CM | POA: Diagnosis not present

## 2019-02-24 DIAGNOSIS — I739 Peripheral vascular disease, unspecified: Secondary | ICD-10-CM

## 2019-02-24 DIAGNOSIS — N400 Enlarged prostate without lower urinary tract symptoms: Secondary | ICD-10-CM | POA: Diagnosis not present

## 2019-02-24 DIAGNOSIS — E785 Hyperlipidemia, unspecified: Secondary | ICD-10-CM

## 2019-02-24 DIAGNOSIS — Z91048 Other nonmedicinal substance allergy status: Secondary | ICD-10-CM

## 2019-02-24 DIAGNOSIS — Z905 Acquired absence of kidney: Secondary | ICD-10-CM

## 2019-02-24 DIAGNOSIS — I129 Hypertensive chronic kidney disease with stage 1 through stage 4 chronic kidney disease, or unspecified chronic kidney disease: Secondary | ICD-10-CM | POA: Diagnosis not present

## 2019-02-24 DIAGNOSIS — Z87891 Personal history of nicotine dependence: Secondary | ICD-10-CM

## 2019-02-24 DIAGNOSIS — I10 Essential (primary) hypertension: Secondary | ICD-10-CM | POA: Diagnosis present

## 2019-02-24 DIAGNOSIS — Z85528 Personal history of other malignant neoplasm of kidney: Secondary | ICD-10-CM

## 2019-02-24 DIAGNOSIS — Z79899 Other long term (current) drug therapy: Secondary | ICD-10-CM

## 2019-02-24 LAB — TSH: TSH: 0.531 u[IU]/mL (ref 0.350–4.500)

## 2019-02-24 LAB — SARS CORONAVIRUS 2 BY RT PCR (HOSPITAL ORDER, PERFORMED IN ~~LOC~~ HOSPITAL LAB): SARS Coronavirus 2: NEGATIVE

## 2019-02-24 MED ORDER — ASPIRIN EC 81 MG PO TBEC
81.0000 mg | DELAYED_RELEASE_TABLET | Freq: Every day | ORAL | Status: DC
  Administered 2019-02-24: 81 mg via ORAL
  Filled 2019-02-24: qty 1

## 2019-02-24 MED ORDER — NIFEDIPINE ER OSMOTIC RELEASE 60 MG PO TB24: 60.0000 mg | ORAL_TABLET | Freq: Two times a day (BID) | ORAL | 0 refills | Status: DC

## 2019-02-24 MED ORDER — ENOXAPARIN SODIUM 30 MG/0.3ML ~~LOC~~ SOLN
30.0000 mg | SUBCUTANEOUS | Status: DC
  Filled 2019-02-24: qty 0.3

## 2019-02-24 MED ORDER — FUROSEMIDE 20 MG PO TABS: 40.0000 mg | ORAL_TABLET | ORAL | Status: DC

## 2019-02-24 MED ORDER — SIMVASTATIN 20 MG PO TABS: 20.0000 mg | ORAL_TABLET | Freq: Every day | ORAL | Status: DC

## 2019-02-24 MED ORDER — NIFEDIPINE ER OSMOTIC RELEASE 60 MG PO TB24
60.0000 mg | ORAL_TABLET | Freq: Two times a day (BID) | ORAL | Status: DC
  Administered 2019-02-24: 60 mg via ORAL
  Filled 2019-02-24 (×2): qty 1

## 2019-02-24 MED ORDER — PANTOPRAZOLE SODIUM 40 MG PO TBEC
40.0000 mg | DELAYED_RELEASE_TABLET | Freq: Every day | ORAL | Status: DC
  Filled 2019-02-24: qty 1

## 2019-02-24 MED ORDER — LEVOTHYROXINE SODIUM 75 MCG PO TABS
75.0000 ug | ORAL_TABLET | Freq: Every day | ORAL | Status: DC
  Administered 2019-02-24: 75 ug via ORAL
  Filled 2019-02-24: qty 1

## 2019-02-24 NOTE — Progress Notes (Signed)
Lorra Hals to be D/C'd per MD order. Discussed with the patient and all questions fully answered. ? VSS, Skin clean, dry and intact without evidence of skin break down, no evidence of skin tears noted. ? IV catheter discontinued intact. Site without signs and symptoms of complications. Dressing and pressure applied. ? An After Visit Summary was printed and given to the patient. Patient informed where to pickup prescriptions. ? D/C education completed with patient/family including follow up instructions, medication list, d/c activities limitations if indicated, with other d/c instructions as indicated by MD - patient able to verbalize understanding, all questions fully answered.  ? Patient instructed to return to ED, call 911, or call MD for any changes in condition.  ? Patient to be escorted via [place], and D/C home via private auto.

## 2019-02-24 NOTE — Progress Notes (Signed)
Patient had 13 beat run of SVT. Patient is alert and oriented in NAD. Asymptomatic at present. MD notified.

## 2019-02-24 NOTE — ED Notes (Signed)
Pt given drink and food by EMT

## 2019-02-24 NOTE — ED Notes (Signed)
Pt delayed in transport due to waiting on covid 19 test orderset.

## 2019-02-24 NOTE — Discharge Summary (Signed)
Name: Tyler [name] MRN: [id number] DOB: [birth year] 85 y.o. PCP: Wayland Salinas, MD  Date of Admission: 02/23/2019  7:48 PM Date of Discharge: 02/24/2019 Attending Physician: Axel Filler,  Discharge Diagnosis: 1. Hypertension   Discharge Medications: Allergies as of 02/24/2019      Reactions   Penicillins Swelling, Rash   Has patient had a PCN reaction causing immediate rash, facial/tongue/throat swelling, SOB or lightheadedness with hypotension: Yes Has patient had a PCN reaction causing severe rash involving mucus membranes or skin necrosis: No Has patient had a PCN reaction that required hospitalization No Has patient had a PCN reaction occurring within the last 10 years: No If all of the above answers are "NO", then may proceed with Cephalosporin use.   Adhesive [tape] Other (See Comments)   TEARS AND BRUISES SKIN- PLEASE USE AN ALTERNATIVE!!      Medication List    TAKE these medications   aspirin EC 81 MG tablet Take 81 mg by mouth daily.   Carboxymethylcellulose Sod PF 0.25 % Soln Place 1-2 drops into both eyes 3 (three) times daily as needed (for dryness).   carvedilol 12.5 MG tablet Commonly known as:  COREG Take 6.25 mg by mouth 2 (two) times daily with a meal.   dicyclomine 10 MG capsule Commonly known as:  BENTYL Take 20 mg by mouth 2 (two) times a day.   doxazosin 8 MG tablet Commonly known as:  CARDURA Take 4 mg by mouth 2 (two) times daily.   furosemide 40 MG tablet Commonly known as:  LASIX Take 40 mg by mouth every other day.   levothyroxine 75 MCG tablet Commonly known as:  SYNTHROID Take 75 mcg by mouth daily before breakfast.   NIFEdipine 60 MG 24 hr tablet Commonly known as:  PROCARDIA XL/NIFEDICAL XL Take 1 tablet (60 mg total) by mouth 2 (two) times a day.   nitroGLYCERIN 0.4 mg/hr patch Commonly known as:  NITRODUR - Dosed in mg/24 hr Place 0.4 mg onto the skin every other day.   omeprazole 20 MG capsule Commonly  known as:  PRILOSEC Take 20 mg by mouth at bedtime.   One-A-Day Mens 50+ Advantage Tabs Take 1 tablet by mouth daily with breakfast.   simvastatin 20 MG tablet Commonly known as:  ZOCOR Take 20 mg by mouth at bedtime.       Disposition and follow-up:   Mr.Tyler [name] was discharged from [facility] in Stable condition.  At the hospital follow up visit please address:  1.  Please assess blood pressure and medication compliance. Recommend reviewed medications with him at office to ensure he is taking them as prescribed.   2.  Labs / imaging needed at time of follow-up: None   3.  Pending labs/ test needing follow-up: None   Follow-up Appointments: Follow-up Information    Ryter-Brown, Shyrl Numbers, MD.   Specialty:  [facility] Medicine Contact information: [place] [id number] [facility] Course by problem list: 1. Hypertension: Mr. Burlingame presented with concern of elevated blood pressure while on his current anti-hypertensive regimen.He had a telehealth visit with his PCP recently, but wanted to be examined and went to the ED for this. He was admitted for BP monitoring. His blood pressure remained stable and within an appropriate range overnight while on his home regimen and he was discharged home the next day. We advised him to schedule an  in-person appointment with his PCP and bring his medications with him to the appointment to ensure he is taking them as prescribed.   Discharge Vitals:   BP (!) 157/82 (BP Location: Right Arm)   Pulse 64   Temp 98.4 F (36.9 C) (Oral)   Resp 18   Ht 5\' 7"  (1.702 m)   Wt 65.3 kg Comment: C scale  SpO2 98%   BMI 22.55 kg/m   Pertinent Labs, Studies, and Procedures:  CBC Latest Ref Rng & Units 02/23/2019 10/13/2015 05/27/2014  WBC 4.0 - 10.5 K/uL 6.4 - -  Hemoglobin 13.0 - 17.0 g/dL 13.8 15.6 16.3  Hematocrit 39.0 - 52.0 % 40.5 46.0 48.0  Platelets 150 - 400 K/uL 178 -  -    BMP Latest Ref Rng & Units 02/23/2019 10/13/2015 05/27/2014  Glucose 70 - 99 mg/dL 105(H) 120(H) 129(H)  BUN 8 - 23 mg/dL 17 17 19   Creatinine 0.61 - 1.24 mg/dL 1.49(H) 1.30(H) 1.50(H)  Sodium 135 - 145 mmol/L 136 144 142  Potassium 3.5 - 5.1 mmol/L 4.1 3.9 4.1  Chloride 98 - 111 mmol/L 102 110 107  CO2 22 - 32 mmol/L 24 - -  Calcium 8.9 - 10.3 mg/dL 10.0 - -     Discharge Instructions: Discharge Instructions    Diet - low sodium heart healthy   Complete by:  As directed    Discharge instructions   Complete by:  As directed    Mr. Poehlman,   Please continue taking all your blood pressure medication as prescribed by your regular doctor. We did not make any changes to your medications.   For the nifedipine your doctor would like to you to take 60 mg every day in the morning. At night,  You can take either 30 or 60 mg depending on your blood pressure before bedtime. If it high take the 60 mg.   Please call your regular doctor to discuss your concerns about your blood pressure. They can make adjustments if needed.   - Dr. Frederico Hamman   Increase activity slowly   Complete by:  As directed       Signed: Welford Roche, MD 02/25/2019, 11:10 AM   Pager: [phone]

## 2019-02-24 NOTE — Progress Notes (Signed)
   Subjective:  No acute events overnight. Feeling well this morning without acute complaints. Continues to report concern over his anti-hypertensive regimen. His PCP decreased night time dose of nifedipine 60-> 30 mg and he does not think this new dose is working for him. He asks for a refill of nifedipine 60 mg BID.   Objective:  Vital signs in last 24 hours: Vitals:   02/24/19 0201 02/24/19 0214 02/24/19 0452 02/24/19 0804  BP: (!) 156/78  (!) 159/70 (!) 157/82  Pulse: 66  63 64  Resp: 16  16 18   Temp: 98.1 F (36.7 C)  98.5 F (36.9 C) 98.4 F (36.9 C)  TempSrc: Oral  Oral Oral  SpO2: 99%  95% 98%  Weight:  65.3 kg    Height:  5\' 7"  (1.702 m)     General: elderly male, well-appearing, in bed in no acute distress CV: RRR,nl s1/S2, no mrg  Pulm: clear anteriorly, comfortable on RA  Ext: warm and well perfused without erythema   Assessment/Plan:  Principal Problem:   HTN (hypertension)  Lorra Hals is a pleasant 85 year-old male with a medical history of RCC s/p R nephrectomy, HTN, and CHF who presented with concerns about labile blood pressures at home after recent changes in his antihypertensive regimen.   # Hypertension: Mr. Kray home blood pressure medications were continued overnight. His blood pressure remained stable and he remains asymptomatic. On review of chart, his PCP advised him to alternate between nifedipine 60 mg and 30 mg at night depending on his blood pressure before bedtime. We discussed his blood pressure is within an acceptable range here and we recommended continuing his current medication regimen as recommended by his PCP. He has a follow up appointment in July 20th. We advised him to call his PCP tomorrow to discuss his concerns. I called his wife home and mobile number but was unable to reach her. I also discussed his medication list with pharmacist. They are unable to check his refill history as he uses the [place] pharmacy which is closed today. He does  use a [facility] in [place] from time to time, picked up nifedipine 30 mg daily recently.   Dispo: Anticipated discharge today.   Welford Roche, MD 02/24/2019, 8:54 AM Pager: [phone]

## 2019-02-24 NOTE — ED Notes (Signed)
ED TO INPATIENT HANDOFF REPORT  ED Nurse Name and Phone #: [phone] Luna Glasgow Name/Age/Gender Tyler [name] 85 y.o. male Room/Bed: 022C/022C  Code Status   Code Status: Prior  Home/SNF/Other Home Patient oriented to: self, place, time and situation Is this baseline? Yes   Triage Complete: Triage complete  Chief Complaint htn  Triage Note Pt says his blood pressure has been "high, around 185" and then it drops "low around 108". Pt is concerned he may be taking too many blood pressure medications.  Also, feels like his heart rate gets elevated at times. No pain, no SOB, no dizziness.    Allergies Allergies  Allergen Reactions  . Penicillins Swelling and Rash    Has patient had a PCN reaction causing immediate rash, facial/tongue/throat swelling, SOB or lightheadedness with hypotension: Yes Has patient had a PCN reaction causing severe rash involving mucus membranes or skin necrosis: No Has patient had a PCN reaction that required hospitalization No Has patient had a PCN reaction occurring within the last 10 years: No If all of the above answers are "NO", then may proceed with Cephalosporin use.   . Adhesive [Tape] Other (See Comments)    TEARS AND BRUISES SKIN- PLEASE USE AN ALTERNATIVE!!    Level of Care/Admitting Diagnosis ED Disposition    ED Disposition Condition Comment   Admit  Hospital Area: [facility] [[id number]]  Level of Care: Telemetry Medical [104]  Covid Evaluation: N/A  Diagnosis: HTN (hypertension) [428768]  Admitting Physician: Axel Filler [[id number]]  Attending Physician: Axel Filler [id number]  PT Class (Do Not Modify): Observation [104]  PT Acc Code (Do Not Modify): Observation [10022]       B Medical/Surgery History Past Medical History:  Diagnosis Date  . Cancer ([place])    kidney  . Carotid artery occlusion   . CHF (congestive heart failure) ([place])   . COPD (chronic obstructive pulmonary disease) ([place])    . Coronary artery disease   . DVT (deep venous thrombosis) ([place])   . Hyperlipidemia   . Hypertension   . Myocardial infarction ([place]) 2009  . Peripheral vascular disease ([place])   . Ulcer    Past Surgical History:  Procedure Laterality Date  . ABDOMINAL AORTAGRAM N/A 04/15/2014   Procedure: ABDOMINAL Maxcine Ham;  Surgeon: Serafina Mitchell, MD;  Location: [facility] CATH LAB;  Service: Cardiovascular;  Laterality: N/A;  . AORTIC ARCH REPAIR  1995  . CAROTID ENDARTERECTOMY  07/17/2009   right  . FEMORAL-POPLITEAL BYPASS GRAFT  1995 and 2003  . KIDNEY SURGERY     resection of right renal mass  . PERIPHERAL VASCULAR CATHETERIZATION N/A 10/13/2015   Procedure: Abdominal Aortogram;  Surgeon: Serafina Mitchell, MD;  Location: [facility] CV LAB;  Service: Cardiovascular;  Laterality: N/A;  . PERIPHERAL VASCULAR CATHETERIZATION Left 10/13/2015   Procedure: Peripheral Vascular Balloon Angioplasty;  Surgeon: Serafina Mitchell, MD;  Location: [facility] CV LAB;  Service: Cardiovascular;  Laterality: Left;  Left femoral limb of  of AO BI femoral graft  . PR VEIN BYPASS GRAFT,AORTO-FEM-POP       A IV Location/Drains/Wounds Patient Lines/Drains/Airways Status   Active Line/Drains/Airways    None          Intake/Output Last 24 hours No intake or output data in the 24 hours ending 02/24/19 0017  Labs/Imaging Results for orders placed or performed during the hospital encounter of 02/23/19 (from the past 48 hour(s))  Basic metabolic panel     Status:  Abnormal   Collection Time: 02/23/19  8:21 PM  Result Value Ref Range   Sodium 136 135 - 145 mmol/L   Potassium 4.1 3.5 - 5.1 mmol/L   Chloride 102 98 - 111 mmol/L   CO2 24 22 - 32 mmol/L   Glucose, Bld 105 (H) 70 - 99 mg/dL   BUN 17 8 - 23 mg/dL   Creatinine, Ser 1.49 (H) 0.61 - 1.24 mg/dL   Calcium 10.0 8.9 - 10.3 mg/dL   GFR calc non Af Amer 42 (L) >60 mL/min   GFR calc Af Amer 48 (L) >60 mL/min   Anion gap 10 5 - 15    Comment: Performed at [facility] [address], [place], [place] [address]  CBC     Status: None   Collection Time: 02/23/19  8:21 PM  Result Value Ref Range   WBC 6.4 4.0 - 10.5 K/uL   RBC 4.52 4.22 - 5.81 MIL/uL   Hemoglobin 13.8 13.0 - 17.0 g/dL   HCT 40.5 39.0 - 52.0 %   MCV 89.6 80.0 - 100.0 fL   MCH 30.5 26.0 - 34.0 pg   MCHC 34.1 30.0 - 36.0 g/dL   RDW 13.2 11.5 - 15.5 %   Platelets 178 150 - 400 K/uL   nRBC 0.0 0.0 - 0.2 %    Comment: Performed at [facility], [place] [address], [place], [place] [address]  Urinalysis, Routine w reflex microscopic     Status: Abnormal   Collection Time: 02/23/19 10:21 PM  Result Value Ref Range   Color, Urine STRAW (A) YELLOW   APPearance CLEAR CLEAR   Specific Gravity, Urine 1.003 (L) 1.005 - 1.030   pH 6.0 5.0 - 8.0   Glucose, UA NEGATIVE NEGATIVE mg/dL   Hgb urine dipstick NEGATIVE NEGATIVE   Bilirubin Urine NEGATIVE NEGATIVE   Ketones, ur 5 (A) NEGATIVE mg/dL   Protein, ur NEGATIVE NEGATIVE mg/dL   Nitrite NEGATIVE NEGATIVE   Leukocytes,Ua MODERATE (A) NEGATIVE   RBC / HPF 0-5 0 - 5 RBC/hpf   WBC, UA 21-50 0 - 5 WBC/hpf   Bacteria, UA MANY (A) NONE SEEN   Squamous Epithelial / LPF 0-5 0 - 5   Mucus PRESENT     Comment: Performed at [facility], [place] [address] [place], [place] [address]   No results found.  Pending Labs Unresulted Labs (From admission, onward)    Start     Ordered   02/23/19 2316  Urine culture  ONCE - STAT,   STAT     02/23/19 2315   02/23/19 2112  TSH  Add-on,   STAT     02/23/19 2111          Vitals/Pain Today's Vitals   02/23/19 2045 02/23/19 2230 02/23/19 2315 02/23/19 2345  BP: (!) 148/108 (!) 171/83 (!) 160/82 137/77  Pulse: 85 76 71 69  Resp:  12 10 15   Temp:      TempSrc:      SpO2: 98% 98% 99% 99%  PainSc:        Isolation Precautions No active isolations  Medications Medications  carvedilol (COREG) tablet 12.5 mg (has no administration in time range)  doxazosin (CARDURA) tablet 8 mg (has  no administration in time range)    Mobility walks with person assist Low fall risk   Focused Assessments Cardiac Assessment Handoff:  Cardiac Rhythm: Normal sinus rhythm No results found for: CKTOTAL, CKMB, CKMBINDEX, TROPONINI No  results found for: DDIMER Does the Patient currently have chest pain? No      R Recommendations: See Admitting Provider Note  Report given to:   Additional Notes: N/A

## 2019-02-26 LAB — URINE CULTURE: Culture: >=100000 — AB

## 2019-03-02 NOTE — ED Provider Notes (Signed)
[facility] Provider Note   CSN: [id number] Arrival date & time: 02/23/19  1944    History   Chief Complaint Chief Complaint  Patient presents with  . Hypertension    HPI Tyler [name] is a 85 y.o. male.     Patient with history of congestive heart failure, DVT, heart failure, peripheral vascular disease presents with elevated blood pressure.  Took additional dose of his nifedipine today that is a 24 medication, he has been taking other medications as prescribed.  He says blood pressures been elevated in the 180s despite this.  Patient generally is well controlled.  This is going on for the past few days.  Patient denies stroke symptoms, chest pain or shortness of breath.  No abdominal pain or vomiting.  No back pain.  Currently no symptoms except for anxiety over the blood pressure medication.     Past Medical History:  Diagnosis Date  . Cancer ([place])    kidney  . Carotid artery occlusion   . CHF (congestive heart failure) ([place])   . COPD (chronic obstructive pulmonary disease) ([place])   . Coronary artery disease   . DVT (deep venous thrombosis) ([place])   . Hyperlipidemia   . Hypertension   . Myocardial infarction ([place]) 2009  . Peripheral vascular disease ([place])   . Ulcer     Patient Active Problem List   Diagnosis Date Noted  . HTN (hypertension) 02/24/2019  . Abdominal aneurysm without mention of rupture 04/09/2014  . Aftercare following surgery of the circulatory system, [place] 10/07/2013  . Peripheral vascular disease, unspecified ([place]) 12/31/2012  . Occlusion and stenosis of carotid artery without mention of cerebral infarction 10/01/2012    Past Surgical History:  Procedure Laterality Date  . ABDOMINAL AORTAGRAM N/A 04/15/2014   Procedure: ABDOMINAL Maxcine Ham;  Surgeon: Serafina Mitchell, MD;  Location: [facility] CATH LAB;  Service: Cardiovascular;  Laterality: N/A;  . AORTIC ARCH REPAIR  1995  . CAROTID ENDARTERECTOMY  07/17/2009   right  .  FEMORAL-POPLITEAL BYPASS GRAFT  1995 and 2003  . KIDNEY SURGERY     resection of right renal mass  . PERIPHERAL VASCULAR CATHETERIZATION N/A 10/13/2015   Procedure: Abdominal Aortogram;  Surgeon: Serafina Mitchell, MD;  Location: [facility] CV LAB;  Service: Cardiovascular;  Laterality: N/A;  . PERIPHERAL VASCULAR CATHETERIZATION Left 10/13/2015   Procedure: Peripheral Vascular Balloon Angioplasty;  Surgeon: Serafina Mitchell, MD;  Location: [facility] CV LAB;  Service: Cardiovascular;  Laterality: Left;  Left femoral limb of  of AO BI femoral graft  . PR VEIN BYPASS GRAFT,AORTO-FEM-POP          Home Medications    Prior to Admission medications   Medication Sig Start Date End Date Taking? Authorizing Provider  aspirin EC 81 MG tablet Take 81 mg by mouth daily.     Yes [provider]  Carboxymethylcellulose Sod PF 0.25 % SOLN Place 1-2 drops into both eyes 3 (three) times daily as needed (for dryness).   Yes [provider]  carvedilol (COREG) 12.5 MG tablet Take 6.25 mg by mouth 2 (two) times daily with a meal.   Yes [provider]  dicyclomine (BENTYL) 10 MG capsule Take 20 mg by mouth 2 (two) times a day.    Yes [provider]  doxazosin (CARDURA) 8 MG tablet Take 4 mg by mouth 2 (two) times daily.   Yes [provider]  furosemide (LASIX) 40 MG tablet Take 40 mg  by mouth every other day.   Yes [provider]  levothyroxine (SYNTHROID) 75 MCG tablet Take 75 mcg by mouth daily before breakfast.   Yes [provider]  Multiple Vitamins-Minerals (ONE-A-DAY MENS 50+ ADVANTAGE) TABS Take 1 tablet by mouth daily with breakfast.   Yes [provider]  nitroGLYCERIN (NITRODUR - DOSED IN MG/24 HR) 0.4 mg/hr patch Place 0.4 mg onto the skin every other day.    Yes [provider]  omeprazole (PRILOSEC) 20 MG capsule Take 20 mg by mouth at bedtime.    Yes [provider]  simvastatin (ZOCOR) 20 MG tablet Take  20 mg by mouth at bedtime.     Yes [provider]  NIFEdipine (PROCARDIA XL/NIFEDICAL XL) 60 MG 24 hr tablet Take 1 tablet (60 mg total) by mouth 2 (two) times a day. 02/24/19   Welford Roche, MD    Family History Family History  Problem Relation Age of Onset  . Heart disease Father        Aneurysm, Abdominal Aortic  . Hypertension Other   . Coronary artery disease Other   . Alzheimer's disease Other   . Other Other        AAA  . Arthritis Other   . Diabetes Other   . Seizures Other     Social History Social History   Tobacco Use  . Smoking status: Former Smoker    Packs/day: 1.00    Years: 40.00    Pack years: 40.00    Types: Cigarettes    Last attempt to quit: 10/11/1999    Years since quitting: 19.4  . Smokeless tobacco: Never Used  Substance Use Topics  . Alcohol use: No    Alcohol/week: 0.0 standard drinks  . Drug use: No     Allergies   Penicillins and Adhesive [tape]   Review of Systems Review of Systems  Constitutional: Negative for chills and fever.  HENT: Negative for congestion.   Eyes: Negative for visual disturbance.  Respiratory: Negative for shortness of breath.   Cardiovascular: Negative for chest pain.  Gastrointestinal: Negative for abdominal pain and vomiting.  Genitourinary: Positive for frequency. Negative for dysuria and flank pain.  Musculoskeletal: Negative for back pain, neck pain and neck stiffness.  Skin: Negative for rash.  Neurological: Negative for weakness, light-headedness, numbness and headaches.     Physical Exam Updated Vital Signs BP (!) 157/82 (BP Location: Right Arm)   Pulse 64   Temp 98.4 F (36.9 C) (Oral)   Resp 18   Ht 5\' 7"  (1.702 m)   Wt 65.3 kg Comment: C scale  SpO2 98%   BMI 22.55 kg/m   Physical Exam Vitals signs and nursing note reviewed.  Constitutional:      Appearance: He is well-developed.  HENT:     Head: Normocephalic and atraumatic.  Eyes:     General:        Right  eye: No discharge.        Left eye: No discharge.     Conjunctiva/sclera: Conjunctivae normal.  Neck:     Musculoskeletal: Normal range of motion and neck supple.     Trachea: No tracheal deviation.  Cardiovascular:     Rate and Rhythm: Normal rate and regular rhythm.  Pulmonary:     Effort: Pulmonary effort is normal.     Breath sounds: Normal breath sounds.  Abdominal:     General: There is no distension.     Palpations: Abdomen is soft.  Tenderness: There is no abdominal tenderness. There is no guarding.  Skin:    General: Skin is warm.     Findings: No rash.  Neurological:     Mental Status: He is alert and oriented to person, place, and time.     GCS: GCS eye subscore is 4. GCS verbal subscore is 5. GCS motor subscore is 6.     Cranial Nerves: Cranial nerves are intact.      ED Treatments / Results  Labs (all labs ordered are listed, but only abnormal results are displayed) Labs Reviewed  URINE CULTURE - Abnormal; Notable for the following components:      Result Value   Culture >=100,000 COLONIES/mL ESCHERICHIA COLI (*)    Organism ID, Bacteria ESCHERICHIA COLI (*)    All other components within normal limits  BASIC METABOLIC PANEL - Abnormal; Notable for the following components:   Glucose, Bld 105 (*)    Creatinine, Ser 1.49 (*)    GFR calc non Af Amer 42 (*)    GFR calc Af Amer 48 (*)    All other components within normal limits  URINALYSIS, ROUTINE W REFLEX MICROSCOPIC - Abnormal; Notable for the following components:   Color, Urine STRAW (*)    Specific Gravity, Urine 1.003 (*)    Ketones, ur 5 (*)    Leukocytes,Ua MODERATE (*)    Bacteria, UA MANY (*)    All other components within normal limits  SARS CORONAVIRUS 2 (HOSPITAL ORDER, [facility])  CBC  TSH    EKG EKG Interpretation  Date/Time:  Saturday Feb 23 2019 21:04:27 EDT Ventricular Rate:  66 PR Interval:  176 QRS Duration: 97 QT Interval:  396 QTC Calculation:  415 R Axis:   14 Text Interpretation:  Unknown rhythm, irregular rate Abnormal R-wave progression, early transition Confirmed by Elnora Morrison [phone]) on 02/23/2019 9:14:01 PM   Radiology No results found.  Procedures Procedures (including critical care time)  Medications Ordered in ED Medications - No data to display   Initial Impression / Assessment and Plan / ED Course  I have reviewed the triage vital signs and the nursing notes.  Pertinent labs & imaging results that were available during my care of the patient were reviewed by me and considered in my medical decision making (see chart for details).       Patient presents with uncontrolled blood pressure the past few days despite compliance with medication.  Patient blood pressures range from 180s to low 100s.  Patient has no current symptoms except mild urinary frequency.  Urinalysis sent and reviewed, will await culture for treatment as he has had multiple antibiotics for urine infections in the past.  Patient has no fever or chills.  Blood work reviewed mild elevated kidney function creatinine 1.49. With uncontrolled blood pressure and patient feeling extremely uncomfortable going home tonight discussed the case with internal medicine for monitoring for blood pressure management.  The patients results and plan were reviewed and discussed.   Any x-rays performed were independently reviewed by myself.   Differential diagnosis were considered with the presenting HPI.  Medications - No data to display  Vitals:   02/24/19 0201 02/24/19 0214 02/24/19 0452 02/24/19 0804  BP: (!) 156/78  (!) 159/70 (!) 157/82  Pulse: 66  63 64  Resp: 16  16 18   Temp: 98.1 F (36.7 C)  98.5 F (36.9 C) 98.4 F (36.9 C)  TempSrc: Oral  Oral Oral  SpO2: 99%  95% 98%  Weight:  65.3 kg    Height:  5\' 7"  (1.702 m)      Final diagnoses:  Essential hypertension  Chronic renal failure, unspecified CKD stage    Admission/ observation were  discussed with the admitting physician, patient and/or family and they are comfortable with the plan.    Final Clinical Impressions(s) / ED Diagnoses   Final diagnoses:  Essential hypertension  Chronic renal failure, unspecified CKD stage    ED Discharge Orders         Ordered    NIFEdipine (PROCARDIA XL/NIFEDICAL XL) 60 MG 24 hr tablet  2 times daily     02/24/19 0926    Increase activity slowly     02/24/19 0141    Diet - low sodium heart healthy     02/24/19 0301    Discharge instructions    Comments:  Mr. Mikles,   Please continue taking all your blood pressure medication as prescribed by your regular doctor. We did not make any changes to your medications.   For the nifedipine your doctor would like to you to take 60 mg every day in the morning. At night,  You can take either 30 or 60 mg depending on your blood pressure before bedtime. If it high take the 60 mg.   Please call your regular doctor to discuss your concerns about your blood pressure. They can make adjustments if needed.   - Dr. Frederico Hamman   02/24/19 3143           Elnora Morrison, MD 03/02/19 1616

## 2019-03-11 ENCOUNTER — Other Ambulatory Visit: Payer: Self-pay

## 2019-03-11 DIAGNOSIS — I779 Disorder of arteries and arterioles, unspecified: Secondary | ICD-10-CM

## 2019-03-11 DIAGNOSIS — R6251 Failure to thrive (child): Secondary | ICD-10-CM

## 2019-03-11 DIAGNOSIS — I7409 Other arterial embolism and thrombosis of abdominal aorta: Secondary | ICD-10-CM

## 2019-03-11 HISTORY — DX: Failure to thrive (child): R62.51

## 2019-03-13 ENCOUNTER — Other Ambulatory Visit: Payer: Self-pay | Admitting: Nephrology

## 2019-03-13 DIAGNOSIS — C649 Malignant neoplasm of unspecified kidney, except renal pelvis: Secondary | ICD-10-CM

## 2019-03-14 ENCOUNTER — Ambulatory Visit (HOSPITAL_COMMUNITY)
Admission: RE | Admit: 2019-03-14 | Discharge: 2019-03-14 | Disposition: A | Discharge status: Home / Self Care | Payer: Medicare Other | Source: Ambulatory Visit | Attending: Family | Admitting: Family

## 2019-03-14 ENCOUNTER — Other Ambulatory Visit: Payer: Self-pay

## 2019-03-14 DIAGNOSIS — I7409 Other arterial embolism and thrombosis of abdominal aorta: Secondary | ICD-10-CM

## 2019-03-15 ENCOUNTER — Other Ambulatory Visit: Payer: Medicare Other

## 2019-03-15 ENCOUNTER — Telehealth (HOSPITAL_COMMUNITY): Payer: Self-pay | Admitting: *Deleted

## 2019-03-15 NOTE — Telephone Encounter (Signed)
The above patient or their representative was contacted and gave the following answers to these questions:         Do you have any of the following symptoms?  no  Fever                    Cough                   Shortness of breath  Do  you have any of the following other symptoms? no   muscle pain         vomiting,        diarrhea        rash         weakness        red eye        abdominal pain         bruising          bruising or bleeding              joint pain           severe headache    Have you been in contact with someone who was or has been sick in the past 2 weeks?  no  Yes                 Unsure                         Unable to assess   Does the person that you were in contact with have any of the following symptoms?   Cough         shortness of breath           muscle pain         vomiting,            diarrhea            rash            weakness           fever            red eye           abdominal pain           bruising  or  bleeding                joint pain                severe headache               Have you  or someone you have been in contact with traveled internationally in th last month?   no      If yes, which countries?   Have you  or someone you have been in contact with traveled outside [place] in th last month?   no      If yes, which state and city?   COMMENTS OR ACTION PLAN FOR THIS PATIENT:          

## 2019-03-18 ENCOUNTER — Other Ambulatory Visit: Payer: Self-pay

## 2019-03-18 ENCOUNTER — Ambulatory Visit (INDEPENDENT_AMBULATORY_CARE_PROVIDER_SITE_OTHER)
Admission: RE | Admit: 2019-03-18 | Discharge: 2019-03-18 | Disposition: A | Discharge status: Home / Self Care | Payer: Medicare Other | Source: Ambulatory Visit | Attending: Surgery | Admitting: Surgery

## 2019-03-18 ENCOUNTER — Ambulatory Visit (INDEPENDENT_AMBULATORY_CARE_PROVIDER_SITE_OTHER): Payer: Medicare Other | Admitting: Surgery

## 2019-03-18 ENCOUNTER — Inpatient Hospital Stay (HOSPITAL_COMMUNITY)
Admission: AD | Admit: 2019-03-18 | Discharge: 2019-03-20 | DRG: 312 | Disposition: A | Discharge status: Home / Self Care | Payer: Medicare Other | Source: Ambulatory Visit | Attending: Internal Medicine | Admitting: Internal Medicine

## 2019-03-18 ENCOUNTER — Encounter (HOSPITAL_COMMUNITY): Payer: Self-pay | Admitting: General Practice

## 2019-03-18 ENCOUNTER — Encounter: Payer: Self-pay | Admitting: Surgery

## 2019-03-18 VITALS — BP 152/81 | HR 62 | Temp 97.9°F | Resp 14 | Ht 67.0 in | Wt 139.0 lb

## 2019-03-18 DIAGNOSIS — N2889 Other specified disorders of kidney and ureter: Secondary | ICD-10-CM | POA: Diagnosis present

## 2019-03-18 DIAGNOSIS — Z86718 Personal history of other venous thrombosis and embolism: Secondary | ICD-10-CM

## 2019-03-18 DIAGNOSIS — R6251 Failure to thrive (child): Secondary | ICD-10-CM | POA: Diagnosis present

## 2019-03-18 DIAGNOSIS — I130 Hypertensive heart and chronic kidney disease with heart failure and stage 1 through stage 4 chronic kidney disease, or unspecified chronic kidney disease: Secondary | ICD-10-CM | POA: Diagnosis present

## 2019-03-18 DIAGNOSIS — Z905 Acquired absence of kidney: Secondary | ICD-10-CM

## 2019-03-18 DIAGNOSIS — N390 Urinary tract infection, site not specified: Secondary | ICD-10-CM

## 2019-03-18 DIAGNOSIS — Z833 Family history of diabetes mellitus: Secondary | ICD-10-CM

## 2019-03-18 DIAGNOSIS — F410 Panic disorder [episodic paroxysmal anxiety] without agoraphobia: Secondary | ICD-10-CM | POA: Diagnosis present

## 2019-03-18 DIAGNOSIS — J449 Chronic obstructive pulmonary disease, unspecified: Secondary | ICD-10-CM | POA: Diagnosis present

## 2019-03-18 DIAGNOSIS — B3742 Candidal balanitis: Secondary | ICD-10-CM | POA: Diagnosis present

## 2019-03-18 DIAGNOSIS — Z6821 Body mass index (BMI) 21.0-21.9, adult: Secondary | ICD-10-CM

## 2019-03-18 DIAGNOSIS — E785 Hyperlipidemia, unspecified: Secondary | ICD-10-CM | POA: Diagnosis present

## 2019-03-18 DIAGNOSIS — Z1629 Resistance to other single specified antibiotic: Secondary | ICD-10-CM | POA: Diagnosis present

## 2019-03-18 DIAGNOSIS — I951 Orthostatic hypotension: Principal | ICD-10-CM | POA: Diagnosis present

## 2019-03-18 DIAGNOSIS — Z85528 Personal history of other malignant neoplasm of kidney: Secondary | ICD-10-CM | POA: Diagnosis not present

## 2019-03-18 DIAGNOSIS — Z7982 Long term (current) use of aspirin: Secondary | ICD-10-CM

## 2019-03-18 DIAGNOSIS — E440 Moderate protein-calorie malnutrition: Secondary | ICD-10-CM

## 2019-03-18 DIAGNOSIS — I252 Old myocardial infarction: Secondary | ICD-10-CM

## 2019-03-18 DIAGNOSIS — E039 Hypothyroidism, unspecified: Secondary | ICD-10-CM | POA: Diagnosis present

## 2019-03-18 DIAGNOSIS — Y92009 Unspecified place in unspecified non-institutional (private) residence as the place of occurrence of the external cause: Secondary | ICD-10-CM

## 2019-03-18 DIAGNOSIS — I739 Peripheral vascular disease, unspecified: Secondary | ICD-10-CM | POA: Diagnosis present

## 2019-03-18 DIAGNOSIS — N183 Chronic kidney disease, stage 3 (moderate): Secondary | ICD-10-CM | POA: Diagnosis present

## 2019-03-18 DIAGNOSIS — F329 Major depressive disorder, single episode, unspecified: Secondary | ICD-10-CM | POA: Diagnosis present

## 2019-03-18 DIAGNOSIS — N471 Phimosis: Secondary | ICD-10-CM | POA: Diagnosis present

## 2019-03-18 DIAGNOSIS — I779 Disorder of arteries and arterioles, unspecified: Secondary | ICD-10-CM

## 2019-03-18 DIAGNOSIS — Z91048 Other nonmedicinal substance allergy status: Secondary | ICD-10-CM | POA: Diagnosis not present

## 2019-03-18 DIAGNOSIS — R55 Syncope and collapse: Secondary | ICD-10-CM | POA: Diagnosis present

## 2019-03-18 DIAGNOSIS — I129 Hypertensive chronic kidney disease with stage 1 through stage 4 chronic kidney disease, or unspecified chronic kidney disease: Secondary | ICD-10-CM | POA: Diagnosis not present

## 2019-03-18 DIAGNOSIS — I509 Heart failure, unspecified: Secondary | ICD-10-CM | POA: Diagnosis present

## 2019-03-18 DIAGNOSIS — Z9862 Peripheral vascular angioplasty status: Secondary | ICD-10-CM

## 2019-03-18 DIAGNOSIS — I493 Ventricular premature depolarization: Secondary | ICD-10-CM | POA: Diagnosis present

## 2019-03-18 DIAGNOSIS — Z8249 Family history of ischemic heart disease and other diseases of the circulatory system: Secondary | ICD-10-CM | POA: Diagnosis not present

## 2019-03-18 DIAGNOSIS — Z820 Family history of epilepsy and other diseases of the nervous system: Secondary | ICD-10-CM

## 2019-03-18 DIAGNOSIS — Z87440 Personal history of urinary (tract) infections: Secondary | ICD-10-CM

## 2019-03-18 DIAGNOSIS — Z79899 Other long term (current) drug therapy: Secondary | ICD-10-CM | POA: Diagnosis not present

## 2019-03-18 DIAGNOSIS — Z1623 Resistance to quinolones and fluoroquinolones: Secondary | ICD-10-CM | POA: Diagnosis present

## 2019-03-18 DIAGNOSIS — Z1624 Resistance to multiple antibiotics: Secondary | ICD-10-CM | POA: Diagnosis not present

## 2019-03-18 DIAGNOSIS — I6529 Occlusion and stenosis of unspecified carotid artery: Secondary | ICD-10-CM | POA: Diagnosis present

## 2019-03-18 DIAGNOSIS — W19XXXA Unspecified fall, initial encounter: Secondary | ICD-10-CM | POA: Diagnosis present

## 2019-03-18 DIAGNOSIS — I2510 Atherosclerotic heart disease of native coronary artery without angina pectoris: Secondary | ICD-10-CM | POA: Diagnosis present

## 2019-03-18 DIAGNOSIS — B9620 Unspecified Escherichia coli [E. coli] as the cause of diseases classified elsewhere: Secondary | ICD-10-CM | POA: Diagnosis not present

## 2019-03-18 DIAGNOSIS — Z880 Allergy status to penicillin: Secondary | ICD-10-CM | POA: Diagnosis not present

## 2019-03-18 DIAGNOSIS — I10 Essential (primary) hypertension: Secondary | ICD-10-CM | POA: Diagnosis present

## 2019-03-18 DIAGNOSIS — I7409 Other arterial embolism and thrombosis of abdominal aorta: Secondary | ICD-10-CM

## 2019-03-18 DIAGNOSIS — K5909 Other constipation: Secondary | ICD-10-CM | POA: Diagnosis present

## 2019-03-18 DIAGNOSIS — R627 Adult failure to thrive: Secondary | ICD-10-CM | POA: Diagnosis present

## 2019-03-18 DIAGNOSIS — N400 Enlarged prostate without lower urinary tract symptoms: Secondary | ICD-10-CM

## 2019-03-18 DIAGNOSIS — Z87891 Personal history of nicotine dependence: Secondary | ICD-10-CM

## 2019-03-18 DIAGNOSIS — Z888 Allergy status to other drugs, medicaments and biological substances status: Secondary | ICD-10-CM | POA: Diagnosis not present

## 2019-03-18 DIAGNOSIS — N481 Balanitis: Secondary | ICD-10-CM | POA: Diagnosis not present

## 2019-03-18 DIAGNOSIS — R319 Hematuria, unspecified: Secondary | ICD-10-CM | POA: Diagnosis not present

## 2019-03-18 DIAGNOSIS — Z1159 Encounter for screening for other viral diseases: Secondary | ICD-10-CM | POA: Diagnosis not present

## 2019-03-18 DIAGNOSIS — Z79890 Hormone replacement therapy (postmenopausal): Secondary | ICD-10-CM

## 2019-03-18 LAB — CBC
HCT: 40 % (ref 39.0–52.0)
Hemoglobin: 13.7 g/dL (ref 13.0–17.0)
MCH: 30.6 pg (ref 26.0–34.0)
MCHC: 34.3 g/dL (ref 30.0–36.0)
MCV: 89.3 fL (ref 80.0–100.0)
Platelets: 163 10*3/uL (ref 150–400)
RBC: 4.48 MIL/uL (ref 4.22–5.81)
RDW: 13.2 % (ref 11.5–15.5)
WBC: 7.4 10*3/uL (ref 4.0–10.5)
nRBC: 0 % (ref 0.0–0.2)

## 2019-03-18 LAB — URINALYSIS, ROUTINE W REFLEX MICROSCOPIC
Bilirubin Urine: NEGATIVE
Glucose, UA: NEGATIVE mg/dL
Ketones, ur: NEGATIVE mg/dL
Nitrite: NEGATIVE
Protein, ur: 30 mg/dL — AB
Specific Gravity, Urine: 1.014 (ref 1.005–1.030)
pH: 5 (ref 5.0–8.0)

## 2019-03-18 LAB — COMPREHENSIVE METABOLIC PANEL
ALT: 13 U/L (ref 0–44)
AST: 14 U/L — ABNORMAL LOW (ref 15–41)
Albumin: 3.5 g/dL (ref 3.5–5.0)
Alkaline Phosphatase: 50 U/L (ref 38–126)
Anion gap: 8 (ref 5–15)
BUN: 18 mg/dL (ref 8–23)
CO2: 24 mmol/L (ref 22–32)
Calcium: 9.6 mg/dL (ref 8.9–10.3)
Chloride: 105 mmol/L (ref 98–111)
Creatinine, Ser: 1.41 mg/dL — ABNORMAL HIGH (ref 0.61–1.24)
GFR calc Af Amer: 52 mL/min — ABNORMAL LOW (ref >60)
GFR calc non Af Amer: 44 mL/min — ABNORMAL LOW (ref >60)
Glucose, Bld: 114 mg/dL — ABNORMAL HIGH (ref 70–99)
Potassium: 3.9 mmol/L (ref 3.5–5.1)
Sodium: 137 mmol/L (ref 135–145)
Total Bilirubin: 0.7 mg/dL (ref 0.3–1.2)
Total Protein: 5.9 g/dL — ABNORMAL LOW (ref 6.5–8.1)

## 2019-03-18 LAB — SARS CORONAVIRUS 2 BY RT PCR (HOSPITAL ORDER, PERFORMED IN ~~LOC~~ HOSPITAL LAB): SARS Coronavirus 2: NEGATIVE

## 2019-03-18 MED ORDER — PHENOL 1.4 % MT LIQD: 1.0000 | OROMUCOSAL | Status: DC | PRN

## 2019-03-18 MED ORDER — NIFEDIPINE ER OSMOTIC RELEASE 30 MG PO TB24
30.0000 mg | ORAL_TABLET | Freq: Two times a day (BID) | ORAL | Status: DC
  Filled 2019-03-18: qty 1

## 2019-03-18 MED ORDER — DICYCLOMINE HCL 10 MG PO CAPS
20.0000 mg | ORAL_CAPSULE | Freq: Two times a day (BID) | ORAL | Status: DC
  Filled 2019-03-18 (×2): qty 2

## 2019-03-18 MED ORDER — SIMVASTATIN 20 MG PO TABS
20.0000 mg | ORAL_TABLET | Freq: Every day | ORAL | Status: DC
  Administered 2019-03-18 – 2019-03-19 (×2): 20 mg via ORAL
  Filled 2019-03-18 (×2): qty 1

## 2019-03-18 MED ORDER — POLYETHYLENE GLYCOL 3350 17 G PO PACK
17.0000 g | PACK | Freq: Every day | ORAL | Status: DC | PRN
  Administered 2019-03-20: 10:00:00 17 g via ORAL
  Filled 2019-03-18: qty 1

## 2019-03-18 MED ORDER — FUROSEMIDE 40 MG PO TABS
40.0000 mg | ORAL_TABLET | ORAL | Status: DC
  Filled 2019-03-18: qty 1

## 2019-03-18 MED ORDER — LABETALOL HCL 5 MG/ML IV SOLN: 10.0000 mg | INTRAVENOUS | Status: DC | PRN

## 2019-03-18 MED ORDER — DOXAZOSIN MESYLATE 2 MG PO TABS
4.0000 mg | ORAL_TABLET | Freq: Every day | ORAL | Status: DC
  Administered 2019-03-18: 4 mg via ORAL
  Filled 2019-03-18: qty 2

## 2019-03-18 MED ORDER — ONDANSETRON HCL 4 MG/2ML IJ SOLN: 4.0000 mg | Freq: Four times a day (QID) | INTRAMUSCULAR | Status: DC | PRN

## 2019-03-18 MED ORDER — CARVEDILOL 6.25 MG PO TABS
6.2500 mg | ORAL_TABLET | Freq: Two times a day (BID) | ORAL | Status: DC
  Administered 2019-03-18 – 2019-03-20 (×4): 6.25 mg via ORAL
  Filled 2019-03-18 (×4): qty 1

## 2019-03-18 MED ORDER — ALUM & MAG HYDROXIDE-SIMETH 200-200-20 MG/5ML PO SUSP: 15.0000 mL | ORAL | Status: DC | PRN

## 2019-03-18 MED ORDER — METOPROLOL TARTRATE 5 MG/5ML IV SOLN: 2.0000 mg | INTRAVENOUS | Status: DC | PRN

## 2019-03-18 MED ORDER — POTASSIUM CHLORIDE CRYS ER 20 MEQ PO TBCR: 20.0000 meq | EXTENDED_RELEASE_TABLET | Freq: Once | ORAL | Status: DC

## 2019-03-18 MED ORDER — HYDRALAZINE HCL 20 MG/ML IJ SOLN: 5.0000 mg | INTRAMUSCULAR | Status: DC | PRN

## 2019-03-18 MED ORDER — PANTOPRAZOLE SODIUM 40 MG PO TBEC
40.0000 mg | DELAYED_RELEASE_TABLET | Freq: Every day | ORAL | Status: DC
  Administered 2019-03-19 – 2019-03-20 (×2): 40 mg via ORAL
  Filled 2019-03-18 (×2): qty 1

## 2019-03-18 MED ORDER — ENSURE ENLIVE PO LIQD
237.0000 mL | Freq: Two times a day (BID) | ORAL | Status: DC
  Administered 2019-03-19: 10:00:00 237 mL via ORAL

## 2019-03-18 MED ORDER — LEVOTHYROXINE SODIUM 75 MCG PO TABS
75.0000 ug | ORAL_TABLET | Freq: Every day | ORAL | Status: DC
  Administered 2019-03-19 – 2019-03-20 (×2): 75 ug via ORAL
  Filled 2019-03-18 (×2): qty 3

## 2019-03-18 MED ORDER — GUAIFENESIN-DM 100-10 MG/5ML PO SYRP: 15.0000 mL | ORAL_SOLUTION | ORAL | Status: DC | PRN

## 2019-03-18 MED ORDER — ASPIRIN EC 81 MG PO TBEC
81.0000 mg | DELAYED_RELEASE_TABLET | Freq: Every day | ORAL | Status: DC
  Administered 2019-03-19 – 2019-03-20 (×2): 81 mg via ORAL
  Filled 2019-03-18 (×2): qty 1

## 2019-03-18 NOTE — [place] Notes (Signed)
Pt direct admit per Dr Trula Slade. Pt chg bath given. Telebox 19 applied/ccmd notified. Vitals stable. Call bell within reach and pt oriented to room. Covid screen ordered.  Jerald Kief, RN

## 2019-03-18 NOTE — [place] Notes (Signed)
[facility]  Patient name: Tyler [name] MRN: [id number] DOB: April 27, 1929 Sex: male   REASON FOR VISIT:    Follow up  [place]:    The patient is back today for followup. He is a former patient of Dr. Amedeo Plenty. He is status post complex aortic reconstruction with an aortobifemoral bypass graft and bilateral renal artery bypass graft. He is also status post right femoral-popliteal bypass graft in 1995 and 2003. I performed a right carotid endarterectomy for asymptomatic stenosis. 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 He underwent angiography on 04/15/2014 to better define this. I found approximately 80% stenosis within the distal limb of the aortobifemoral graft. On 05/27/2014, he underwent drug coated balloon angioplasty of what appeared to be a stenosis within the limb of his aortobifemoral graft. There was a residual stenosis after angioplasty.  The patient has multiple complaints today.  He has been having intermittent urinary tract infections.  He continues to complain of dysuria and polyuria.  He has had trouble with his blood pressure both hypertension and hypotension.  He has had a 20 pound weight loss.  He passed out this past Friday night.  He has been started on SSRI medication however had to discontinue it secondary to hives as well as tachycardia.   PAST MEDICAL HISTORY:   Past Medical History:  Diagnosis Date  . Cancer ([place])    kidney  . Carotid artery occlusion   . CHF (congestive heart failure) ([place])   . COPD (chronic obstructive pulmonary disease) ([place])   . Coronary artery disease   . DVT (deep venous thrombosis) ([place])   . Hyperlipidemia   . Hypertension   . Myocardial infarction ([place]) 2009  . Peripheral vascular disease ([place])   . Ulcer      FAMILY HISTORY:   Family History  Problem Relation Age of Onset  . Heart disease Father        Aneurysm,  Abdominal Aortic  . Hypertension Other   . Coronary artery disease Other   . Alzheimer's disease Other   . Other Other        AAA  . Arthritis Other   . Diabetes Other   . Seizures Other     SOCIAL HISTORY:   Social History   Tobacco Use  . Smoking status: Former Smoker    Packs/day: 1.00    Years: 40.00    Pack years: 40.00    Types: Cigarettes    Last attempt to quit: 10/11/1999    Years since quitting: 19.4  . Smokeless tobacco: Never Used  Substance Use Topics  . Alcohol use: No    Alcohol/week: 0.0 standard drinks     ALLERGIES:   Allergies  Allergen Reactions  . Buspirone Itching and Rash  . Penicillins Swelling and Rash    Has patient had a PCN reaction causing immediate rash, facial/tongue/throat swelling, SOB or lightheadedness with hypotension: Yes Has patient had a PCN reaction causing severe rash involving mucus membranes or skin necrosis: No Has patient had a PCN reaction that required hospitalization No Has patient had a PCN reaction occurring within the last 10 years: No If all of the above answers are "NO", then may proceed with Cephalosporin use.   . Other Other (See Comments)    TEARS AND BRUISES SKIN- PLEASE USE AN ALTERNATIVE!!  . Adhesive [Tape] Other (See Comments)    TEARS AND BRUISES SKIN- PLEASE USE AN ALTERNATIVE!!  CURRENT MEDICATIONS:   Current Outpatient Medications  Medication Sig Dispense Refill  . aspirin EC 81 MG tablet Take 81 mg by mouth daily.      . Carboxymethylcellulose Sod PF 0.25 % SOLN Place 1-2 drops into both eyes 3 (three) times daily as needed (for dryness).    . carvedilol (COREG) 12.5 MG tablet Take 6.25 mg by mouth 2 (two) times daily with a meal.    . dicyclomine (BENTYL) 10 MG capsule Take 20 mg by mouth 2 (two) times a day.     . doxazosin (CARDURA) 8 MG tablet Take 4 mg by mouth at bedtime.     . furosemide (LASIX) 40 MG tablet Take 40 mg by mouth every other day.    . levothyroxine (SYNTHROID) 75 MCG  tablet Take 75 mcg by mouth daily before breakfast.    . Multiple Vitamins-Minerals (ONE-A-DAY MENS 50+ ADVANTAGE) TABS Take 1 tablet by mouth daily with breakfast.    . NIFEdipine (PROCARDIA XL/NIFEDICAL XL) 60 MG 24 hr tablet Take 1 tablet (60 mg total) by mouth 2 (two) times a day. (Patient taking differently: Take 30 mg by mouth 2 (two) times a day. ) 30 tablet 0  . omeprazole (PRILOSEC) 20 MG capsule Take 20 mg by mouth at bedtime.     . simvastatin (ZOCOR) 20 MG tablet Take 20 mg by mouth at bedtime.      . nitroGLYCERIN (NITRODUR - DOSED IN MG/24 HR) 0.4 mg/hr patch Place 0.4 mg onto the skin every other day.      No current facility-administered medications for this visit.     REVIEW OF SYSTEMS:   [X]  denotes positive finding, [ ]  denotes negative finding Cardiac  Comments:  Chest pain or chest pressure:    Shortness of breath upon exertion:    Short of breath when lying flat:    Irregular heart rhythm:        Vascular    Pain in calf, thigh, or hip brought on by ambulation: x   Pain in feet at night that wakes you up from your sleep:     Blood clot in your veins:    Leg swelling:         Pulmonary    Oxygen at home:    Productive cough:     Wheezing:         Neurologic    Sudden weakness in arms or legs:     Sudden numbness in arms or legs:     Sudden onset of difficulty speaking or slurred speech:    Temporary loss of vision in one eye:     Problems with dizziness:         Gastrointestinal    Blood in stool:     Vomited blood:         Genitourinary    Burning when urinating:  x   Blood in urine:        Psychiatric    Major depression:         Hematologic    Bleeding problems:    Problems with blood clotting too easily:        Skin    Rashes or ulcers:        Constitutional    Fever or chills:      PHYSICAL EXAM:   Vitals:   03/18/19 1105  Resp: 14  Weight: 63 kg  Height: 5\' 7"  (1.702 m)    GENERAL: The patient is a well-nourished male,  in  no acute distress. The vital signs are documented above. CARDIAC: There is a regular rate and rhythm.  PULMONARY: Non-labored respirations ABDOMEN: Soft and non-tender with normal pitched bowel sounds.  MUSCULOSKELETAL: There are no major deformities or cyanosis. NEUROLOGIC: No focal weakness or paresthesias are detected. SKIN: There are no ulcers or rashes noted. PSYCHIATRIC: The patient has a normal affect.  STUDIES:   I have ordered and reviewed his u/s with the following findings: Right Carotid: Non-hemodynamically significant plaque <50% noted in the CCA.                Patent right carotid endarterectomy with plaque formation without                evidence of restenosis.  Left Carotid: Velocities in the left ICA are consistent with a 1-39% stenosis.               Non-hemodynamically significant plaque noted in the CCA. The ECA               appears <50% stenosed.  ABI/TBIToday's ABIToday's TBIPrevious ABIPrevious TBI +-------+-----------+-----------+------------+------------+ Right  0.96       0.90       1.03        0.64         +-------+-----------+-----------+------------+------------+ Left   0.76       0.86       0.78        0.36         +-------+-----------+-----------+------------+------------+  Stenosis: Patent aortobifemoral bypass graft with >70% stenosis involving the distal anastomosis of the left limb.  MEDICAL ISSUES:   I have known the patient for quite some time and this is the worst he has appeared in the office.  His wife is very concerned about him.  They are also very frustrated and concerned that something is wrong but it nothing is being done about it.  I have therefore decided to admit him to the hospital for a medical work-up.  I am admitting him under my service so as to avoid going through the emergency department.  Most of his issues are medical.   He was scheduled to have an appointment with [facility].  We will contact them  so that he can be visited while in the hospital.  He feels that he has something wrong with his heart.  Cardiology consultation will be obtained.  He will need a syncopal work-up.  His carotid Doppler studies were within normal limits.    Leia Alf, MD, FACS [facility] [phone] Pager [phone]

## 2019-03-19 ENCOUNTER — Inpatient Hospital Stay (HOSPITAL_COMMUNITY): Payer: Medicare Other

## 2019-03-19 DIAGNOSIS — Z85528 Personal history of other malignant neoplasm of kidney: Secondary | ICD-10-CM

## 2019-03-19 DIAGNOSIS — E440 Moderate protein-calorie malnutrition: Secondary | ICD-10-CM

## 2019-03-19 DIAGNOSIS — I2510 Atherosclerotic heart disease of native coronary artery without angina pectoris: Secondary | ICD-10-CM

## 2019-03-19 DIAGNOSIS — F419 Anxiety disorder, unspecified: Secondary | ICD-10-CM

## 2019-03-19 DIAGNOSIS — F329 Major depressive disorder, single episode, unspecified: Secondary | ICD-10-CM

## 2019-03-19 DIAGNOSIS — N400 Enlarged prostate without lower urinary tract symptoms: Secondary | ICD-10-CM

## 2019-03-19 DIAGNOSIS — Z87440 Personal history of urinary (tract) infections: Secondary | ICD-10-CM

## 2019-03-19 DIAGNOSIS — N183 Chronic kidney disease, stage 3 (moderate): Secondary | ICD-10-CM

## 2019-03-19 DIAGNOSIS — Z79899 Other long term (current) drug therapy: Secondary | ICD-10-CM

## 2019-03-19 DIAGNOSIS — I129 Hypertensive chronic kidney disease with stage 1 through stage 4 chronic kidney disease, or unspecified chronic kidney disease: Secondary | ICD-10-CM

## 2019-03-19 DIAGNOSIS — Z905 Acquired absence of kidney: Secondary | ICD-10-CM

## 2019-03-19 DIAGNOSIS — R55 Syncope and collapse: Secondary | ICD-10-CM

## 2019-03-19 DIAGNOSIS — Z1624 Resistance to multiple antibiotics: Secondary | ICD-10-CM

## 2019-03-19 DIAGNOSIS — E039 Hypothyroidism, unspecified: Secondary | ICD-10-CM

## 2019-03-19 DIAGNOSIS — Z91048 Other nonmedicinal substance allergy status: Secondary | ICD-10-CM

## 2019-03-19 DIAGNOSIS — Z79890 Hormone replacement therapy (postmenopausal): Secondary | ICD-10-CM

## 2019-03-19 DIAGNOSIS — Z95820 Peripheral vascular angioplasty status with implants and grafts: Secondary | ICD-10-CM

## 2019-03-19 DIAGNOSIS — B9620 Unspecified Escherichia coli [E. coli] as the cause of diseases classified elsewhere: Secondary | ICD-10-CM

## 2019-03-19 DIAGNOSIS — Z87891 Personal history of nicotine dependence: Secondary | ICD-10-CM

## 2019-03-19 DIAGNOSIS — R627 Adult failure to thrive: Secondary | ICD-10-CM

## 2019-03-19 DIAGNOSIS — R319 Hematuria, unspecified: Secondary | ICD-10-CM

## 2019-03-19 DIAGNOSIS — N481 Balanitis: Secondary | ICD-10-CM

## 2019-03-19 DIAGNOSIS — N390 Urinary tract infection, site not specified: Secondary | ICD-10-CM

## 2019-03-19 DIAGNOSIS — I7409 Other arterial embolism and thrombosis of abdominal aorta: Secondary | ICD-10-CM

## 2019-03-19 DIAGNOSIS — I739 Peripheral vascular disease, unspecified: Secondary | ICD-10-CM

## 2019-03-19 DIAGNOSIS — Z888 Allergy status to other drugs, medicaments and biological substances status: Secondary | ICD-10-CM

## 2019-03-19 MED ORDER — NIFEDIPINE ER OSMOTIC RELEASE 30 MG PO TB24: 30.0000 mg | ORAL_TABLET | Freq: Every day | ORAL | Status: DC

## 2019-03-19 MED ORDER — CEPHALEXIN 500 MG PO CAPS
500.0000 mg | ORAL_CAPSULE | Freq: Two times a day (BID) | ORAL | Status: DC
  Administered 2019-03-19 – 2019-03-20 (×3): 500 mg via ORAL
  Filled 2019-03-19 (×3): qty 1

## 2019-03-19 MED ORDER — NIFEDIPINE ER OSMOTIC RELEASE 30 MG PO TB24
30.0000 mg | ORAL_TABLET | Freq: Every day | ORAL | Status: DC
  Filled 2019-03-19: qty 1

## 2019-03-19 MED ORDER — TAMSULOSIN HCL 0.4 MG PO CAPS
0.4000 mg | ORAL_CAPSULE | Freq: Every day | ORAL | Status: DC
  Administered 2019-03-19: 19:00:00 0.4 mg via ORAL
  Filled 2019-03-19: qty 1

## 2019-03-19 MED ORDER — CLOTRIMAZOLE-BETAMETHASONE 1-0.05 % EX CREA
TOPICAL_CREAM | Freq: Two times a day (BID) | CUTANEOUS | Status: DC
  Administered 2019-03-20: 15:00:00 via TOPICAL
  Filled 2019-03-19: qty 15

## 2019-03-19 MED ORDER — ADULT MULTIVITAMIN W/MINERALS CH
1.0000 | ORAL_TABLET | Freq: Every day | ORAL | Status: DC
  Administered 2019-03-19 – 2019-03-20 (×2): 1 via ORAL
  Filled 2019-03-19 (×2): qty 1

## 2019-03-19 MED ORDER — SODIUM CHLORIDE 0.9 % IV SOLN
INTRAVENOUS | Status: AC
  Administered 2019-03-19: 12:00:00 via INTRAVENOUS

## 2019-03-19 MED ORDER — ENSURE ENLIVE PO LIQD
237.0000 mL | Freq: Three times a day (TID) | ORAL | Status: DC
  Administered 2019-03-20: 10:00:00 237 mL via ORAL

## 2019-03-19 NOTE — Consult Note (Signed)
H&P Physician requesting consult: Serafina Mitchell, MD  Chief Complaint: Phimosis, gross hematuria  History of Present Illness: 85 year old male seen in the past by Dr. Karsten Ro.  He has multiple medical comorbidities. He has multiple medical problems including a history of peripheral vascular disease status post bypass, history of bilateral renal artery bypass graft, history of carotid endarterectomy, hypertension, stage III CKD, coronary artery disease, and a remote history of renal cell carcinoma status post right partial nephrectomy.  Most recently, the patient was seen in the urology clinic about 4 years ago.  At that time he was having significant nocturia.  He was on an alpha-blocker and was placed on Myrbetriq but never followed up.  His main complaint now is the development of phimosis.  He states that he has gotten infection and irritation of the penis.  He is currently using nystatin cream that he feels like helps.  He is no longer able to pull back his foreskin.  At that time, he has noticed some dysuria as well as a couple of episodes of gross hematuria.  He states that he otherwise feels like he is voiding well.  He is currently admitted from the vascular surgery clinic.  Over the past 2 weeks, he has had some failure to thrive.  He also had a recent syncopal event.  He was recently diagnosed with urinary tract infection and was treated with Macrobid.  Past Medical History:  Diagnosis Date  . Cancer ([place])    kidney  . Carotid artery occlusion   . CHF (congestive heart failure) ([place])   . COPD (chronic obstructive pulmonary disease) ([place])   . Coronary artery disease   . DVT (deep venous thrombosis) ([place])   . Failure to thrive (0-17) 03/2019  . Hyperlipidemia   . Hypertension   . Myocardial infarction ([place]) 2009  . Peripheral vascular disease ([place])   . Ulcer    Past Surgical History:  Procedure Laterality Date  . ABDOMINAL AORTAGRAM N/A 04/15/2014   Procedure: ABDOMINAL  Maxcine Ham;  Surgeon: Serafina Mitchell, MD;  Location: [facility] CATH LAB;  Service: Cardiovascular;  Laterality: N/A;  . AORTIC ARCH REPAIR  1995  . CAROTID ENDARTERECTOMY  07/17/2009   right  . FEMORAL-POPLITEAL BYPASS GRAFT  1995 and 2003  . KIDNEY SURGERY     resection of right renal mass  . PERIPHERAL VASCULAR CATHETERIZATION N/A 10/13/2015   Procedure: Abdominal Aortogram;  Surgeon: Serafina Mitchell, MD;  Location: [place] [facility] LAB;  Service: Cardiovascular;  Laterality: N/A;  . PERIPHERAL VASCULAR CATHETERIZATION Left 10/13/2015   Procedure: Peripheral Vascular Balloon Angioplasty;  Surgeon: Serafina Mitchell, MD;  Location: [place] [facility] LAB;  Service: Cardiovascular;  Laterality: Left;  Left femoral limb of  of AO BI femoral graft  . PR VEIN BYPASS GRAFT,AORTO-FEM-POP      Home Medications:  Medications Prior to Admission  Medication Sig Dispense Refill Last Dose  . carboxymethylcellulose (REFRESH TEARS) 0.5 % SOLN Place 1-2 drops into both eyes See admin instructions. Instill 1-2 drops into each eye 2 times a day (morning and bedtime) and an additional 1-2 drops into each eye daily as needed for dryness   03/18/2019 at Unknown time  . carvedilol (COREG) 12.5 MG tablet Take 6.25 mg by mouth 2 (two) times daily with a meal.   03/18/2019 at 1600  . doxazosin (CARDURA) 8 MG tablet Take 4 mg by mouth at bedtime.    03/17/2019 at pm  . levothyroxine (SYNTHROID) 75 MCG tablet Take  75 mcg by mouth daily before breakfast.   03/18/2019 at 0200  . Multiple Vitamins-Minerals (ONE-A-DAY MENS 50+ ADVANTAGE) TABS Take 1 tablet by mouth daily with breakfast.   03/18/2019 at Unknown time  . NIFEdipine (ADALAT CC) 30 MG 24 hr tablet Take 30 mg by mouth daily at 12 noon.   03/18/2019 at 1200  . nystatin cream (MYCOSTATIN) Apply 1 application topically See admin instructions. Apply to penis three times a day   03/18/2019 at Unknown time  . omeprazole (PRILOSEC) 20 MG capsule Take 20 mg by mouth at bedtime.    03/17/2019 at pm  .  simvastatin (ZOCOR) 20 MG tablet Take 20 mg by mouth at bedtime.     03/17/2019 at pm  . aspirin EC 81 MG tablet Take 81 mg by mouth daily.     On hold at On hold  . dicyclomine (BENTYL) 10 MG capsule Take 20 mg by mouth 2 (two) times a day.    On hold at On hold  . furosemide (LASIX) 40 MG tablet Take 40 mg by mouth every other day.   On hold at On hold  . nitroGLYCERIN (NITRODUR - DOSED IN MG/24 HR) 0.4 mg/hr patch Place 0.4 mg onto the skin See admin instructions. Apply one 0.4 mg patch every other day as needed for elevated B/P   On hold at On hold   Allergies:  Allergies  Allergen Reactions  . Adhesive [Tape] Other (See Comments)    TEARS AND BRUISES SKIN- PLEASE USE AN ALTERNATIVE LIKE COBAN WRAP!!  . Buspirone Hives, Itching, Rash and Hypertension  . Penicillins Swelling and Rash    Has patient had a PCN reaction causing immediate rash, facial/tongue/throat swelling, SOB or lightheadedness with hypotension: Yes Has patient had a PCN reaction causing severe rash involving mucus membranes or skin necrosis: No Has patient had a PCN reaction that required hospitalization No Has patient had a PCN reaction occurring within the last 10 years: No If all of the above answers are "NO", then may proceed with Cephalosporin use.     Family History  Problem Relation Age of Onset  . Heart disease Father        Aneurysm, Abdominal Aortic  . Hypertension Other   . Coronary artery disease Other   . Alzheimer's disease Other   . Other Other        AAA  . Arthritis Other   . Diabetes Other   . Seizures Other    Social History:  reports that he quit smoking about 19 years ago. His smoking use included cigarettes. He has a 40.00 pack-year smoking history. He has never used smokeless tobacco. He reports that he does not drink alcohol or use drugs.  ROS: A complete review of systems was performed.  All systems are negative except for pertinent findings as noted. ROS   Physical Exam:  Vital  signs in last 24 hours: Temp:  [97.8 F (36.6 C)-98.3 F (36.8 C)] 98 F (36.7 C) (06/09 1635) Pulse Rate:  [43-87] 63 (06/09 1635) Resp:  [12-19] 15 (06/09 1635) BP: (81-150)/(49-77) 144/71 (06/09 1635) SpO2:  [97 %-99 %] 99 % (06/09 1635) Weight:  [09 kg] 63 kg (06/08 2042) General:  Alert and oriented, No acute distress HEENT: Normocephalic, atraumatic Neck: No JVD or lymphadenopathy Cardiovascular: Regular rate and rhythm Lungs: Regular rate and effort Abdomen: Soft, nontender, nondistended, no abdominal masses Genitourinary: Uncircumcised phallus.  He has phimosis.  Unable to retract foreskin. Back: No CVA tenderness Extremities:  No edema Neurologic: Grossly intact  Laboratory Data:  No results found for this or any previous visit (from the past 24 hour(s)). Recent Results (from the past 240 hour(s))  SARS Coronavirus 2 (CEPHEID - Performed in [place] hospital lab), Hosp Order     Status: None   Collection Time: 03/18/19  2:43 PM  Result Value Ref Range Status   SARS Coronavirus 2 NEGATIVE NEGATIVE Final    Comment: (NOTE) If result is NEGATIVE SARS-CoV-2 target nucleic acids are NOT DETECTED. The SARS-CoV-2 RNA is generally detectable in upper and lower  respiratory specimens during the acute phase of infection. The lowest  concentration of SARS-CoV-2 viral copies this assay can detect is 250  copies / mL. A negative result does not preclude SARS-CoV-2 infection  and should not be used as the sole basis for treatment or other  patient management decisions.  A negative result may occur with  improper specimen collection / handling, submission of specimen other  than nasopharyngeal swab, presence of viral mutation(s) within the  areas targeted by this assay, and inadequate number of viral copies  (<250 copies / mL). A negative result must be combined with clinical  observations, patient history, and epidemiological information. If result is POSITIVE SARS-CoV-2  target nucleic acids are DETECTED. The SARS-CoV-2 RNA is generally detectable in upper and lower  respiratory specimens dur ing the acute phase of infection.  Positive  results are indicative of active infection with SARS-CoV-2.  Clinical  correlation with patient history and other diagnostic information is  necessary to determine patient infection status.  Positive results do  not rule out bacterial infection or co-infection with other viruses. If result is PRESUMPTIVE POSTIVE SARS-CoV-2 nucleic acids MAY BE PRESENT.   A presumptive positive result was obtained on the submitted specimen  and confirmed on repeat testing.  While 2019 novel coronavirus  (SARS-CoV-2) nucleic acids may be present in the submitted sample  additional confirmatory testing may be necessary for epidemiological  and / or clinical management purposes  to differentiate between  SARS-CoV-2 and other Sarbecovirus currently known to infect humans.  If clinically indicated additional testing with an alternate test  methodology [phone]) is advised. The SARS-CoV-2 RNA is generally  detectable in upper and lower respiratory sp ecimens during the acute  phase of infection. The expected result is Negative. Fact Sheet for Patients:  StrictlyIdeas.no Fact Sheet for Healthcare Providers: BankingDealers.co.za This test is not yet approved or cleared by the [place] FDA and has been authorized for detection and/or diagnosis of SARS-CoV-2 by FDA under an Emergency Use Authorization (EUA).  This EUA will remain in effect (meaning this test can be used) for the duration of the COVID-19 declaration under Section 564(b)(1) of the Act, 21 U.S.C. section 360bbb-3(b)(1), unless the authorization is terminated or revoked sooner. Performed at [facility], [place] [address], [place], [place] [address]    Creatinine: Recent Labs    03/18/19 1531  CREATININE 1.41*     Impression/Assessment:  Phimosis History of renal cell carcinoma Nocturia Balanitis, possible urinary tract infection Gross hematuria  Plan:  Agree with continuing Keflex.  I discussed circumcision versus dorsal slit with him.  He needs to recover from his failure to thrive before consideration of surgery.  Recommend discontinuing nystatin cream and switch to betamethasone/clotrimazole cream.  I placed the order for this.  Offered to make him a follow-up with Dr. Karsten Ro.  He declined currently.  He would like to just call the office  after he is discharged.  I gave him our phone number and the name of Dr. Karsten Ro.  Can consider cystoscopy for further evaluation of gross hematuria, potentially at the time of circumcision/dorsal slit  Marton Redwood, III 03/19/2019, 5:13 PM

## 2019-03-19 NOTE — Consult Note (Signed)
Date: 03/19/2019               Patient Name:  Tyler [name] MRN: [id number]  DOB: 19-Oct-1928 Age / Sex: 85 y.o., male   PCP: Wayland Salinas, MD         Medical Service: Internal Medicine Teaching Service         Attending Physician: Dr. Serafina Mitchell, MD    First Contact: Dr. Alfonse Spruce Pager: [phone]  Second Contact: Dr. Maricela Bo Pager: [phone]       After Hours (After 5p/  First Contact Pager: [phone]  weekends / holidays): Second Contact Pager: [phone]   Chief Complaint: weight loss  History of Present Illness:  Tyler [name] is an 85yo male with PMH of PVD s/p aortobifemoral bypass, bil renal artery bypass graft, right fem-pop bypass x2, carotid endarterectomy, HTN, CKD stage 3, hypothyroidism, CAD, h/o renal cancer s/p right partial nephrectomy in 1995, who was directly admitted by Dr. Trula Slade from clinic due to failure to thrive and fall. IMTS was asked to consult to assist with management.  Patient endorses about 3 weeks of decreased appetite, weight loss of about 20 lbs and feeling weak. A few days ago, he got up to use the restroom overnight and while urinating felt a wave of dizziness rush over him making him lose balance and fall on his right side. He denies hitting his head of loss of consciousness. He states he was able to get up almost immediately. He endorses mild right, lateral chest tenderness. He has been able to ambulate without issues since then. He denies chest pain or palpitations surrounding the fall. He is worried his blood pressure goes too low at night; he noted overnight that his SBP read about 115 and he noted increased weakness but no dizziness or lightheadedness.  He presented to urgent care on 6/5 for increased urinary frequency and dysuria; on exam he was noted to have phimosis with concern for fungal infection. Urine culture revealed >741O E colic with resistance to cipro/levo and bactrim. Before the culture came back he was given nystatin cream for  diagnosis of candidal balanitis; he does report he had been prescribed and was taking nitrofurantoin. He reports improvement of rash with the cream but continues to have increased urinary frequency without hematuria, dysuria, penile discharge, flank pain, back pain.  He endorses early satiety and night sweats (mostly related to panic attacks at night he states), but denies fevers, chills, nausea, vomiting, abdominal pain. He endorses chronic constipation for which he takes dulcolax; he denies BRBPR or melena.   Patient endorses chronically feeling an irregular heart rhythm, but denies chest pain, cough, hemoptysis, shortness of breath.  Patient denies new rashes, changes to his nevi/AKs including itching or easy bleeding.  He reports a diagnosis of MDD and anxiety; he had a rash with buspar after which he was started on sertraline. After a couple of days he decided he didn't need it so he stopped, however now he thinks he might benefit from it.   Patient endorses 40 pack year smoking history, quit 20 years ago. He states he has never had a colonoscopy. He is uncertain if he's had his PSA checked but states he was told he has an enlarged prostate. He reports that Dr. Jimmy Footman ([facility]) was planning on performing what sounds like a CT abd/pelv to evaluate his kidneys (me mentioned drinking oral contrast). He doesn't think he has a family history of cancer to his knowledge.  On  admission, patient has stable VS. CBC unremarkable. CMet reveals stable CKD 3 with Cr 1.41, BUN 18 and stable electrolytes. UA revealedprotein, LE and many bacteria; urine culture is pending. TSH checked on 5/16 was 0.531. Telemetry reviewed from overnight reveals sinus rhythm, occasional PVC and PACs  Meds:  Current Meds  Medication Sig  . carboxymethylcellulose (REFRESH TEARS) 0.5 % SOLN Place 1-2 drops into both eyes See admin instructions. Instill 1-2 drops into each eye 2 times a day (morning and bedtime) and an additional 1-2  drops into each eye daily as needed for dryness  . carvedilol (COREG) 12.5 MG tablet Take 6.25 mg by mouth 2 (two) times daily with a meal.  . doxazosin (CARDURA) 8 MG tablet Take 4 mg by mouth at bedtime.   Marland Kitchen levothyroxine (SYNTHROID) 75 MCG tablet Take 75 mcg by mouth daily before breakfast.  . Multiple Vitamins-Minerals (ONE-A-DAY MENS 50+ ADVANTAGE) TABS Take 1 tablet by mouth daily with breakfast.  . NIFEdipine (ADALAT CC) 30 MG 24 hr tablet Take 30 mg by mouth daily at 12 noon.  . nystatin cream (MYCOSTATIN) Apply 1 application topically See admin instructions. Apply to penis three times a day  . omeprazole (PRILOSEC) 20 MG capsule Take 20 mg by mouth at bedtime.   . simvastatin (ZOCOR) 20 MG tablet Take 20 mg by mouth at bedtime.     Allergies: Allergies as of 03/18/2019 - Review Complete 03/18/2019  Allergen Reaction Noted  . Adhesive [tape] Other (See Comments) 02/23/2019  . Buspirone Hives, Itching, Rash, and Hypertension 03/11/2019  . Penicillins Swelling and Rash 09/07/2011   Past Medical History:  Diagnosis Date  . Cancer ([place])    kidney  . Carotid artery occlusion   . CHF (congestive heart failure) ([place])   . COPD (chronic obstructive pulmonary disease) ([place])   . Coronary artery disease   . DVT (deep venous thrombosis) ([place])   . Failure to thrive (0-17) 03/2019  . Hyperlipidemia   . Hypertension   . Myocardial infarction ([place]) 2009  . Peripheral vascular disease ([place])   . Ulcer     Family History: No history of cancer, heart issues, lung issues.  Social History: Lives with his wife who is in poor health; they have grown children who help them. Endorses 40 pack year smoking history; quit 20 years ago. He denies EtOH use. Denies prior or recent illicit drug use. 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  Review of Systems: A complete ROS was negative except as per HPI.   Physical Exam: Blood pressure (!) 135/51, pulse 61, temperature 97.8 F (36.6 C),  temperature source Oral, resp. rate 17, height 5\' 7"  (1.702 m), weight 63 kg, SpO2 99 %. GENERAL- alert, co-operative, appears as stated age, not in any distress. HEENT- Atraumatic, normocephalic, EOMI, oral mucosa appears moist CARDIAC- RRR with occasional ectopic beats, no murmurs, rubs or gallops. RESP- Moving equal volumes of air, and clear to auscultation bilaterally, no wheezes or crackles. ABDOMEN- Soft, nontender, bowel sounds present. NEURO- No obvious Cr N abnormality. EXTREMITIES- pulse 1+ DP/PT, symmetric, no pedal edema. SKIN- Warm, dry. Multiple AKs and SKs most concentrated on scalp and temples. Skin discoloration over left pec with irregular border; no associated skin irritation. GU- glans penis and foreskin noted to have bleeding; unable to visualize focal skin breakdown or lesions; no notable penile discharge otherwise. PSYCH- Normal mood and affect, appropriate thought content and speech.  Assessment & Plan by Problem: Active Problems:   HTN (hypertension)  Failure to thrive (0-17)   Syncope   Hypothyroidism   BPH (benign prostatic hyperplasia)   UTI (urinary tract infection)  Weight loss Failure to thrive: Patient with 8kg weight loss over the last 3-4 months per chart review associated with poor appetite. He has had recurrent UTIs and has untreated depression/anxiety which could certainly contribute, however there is concern for another organic cause of his weight loss. He does have diffuse atherosclerotic disease which placed him at risk for mesentaric ischemia, however he denies other symptoms that would be more commonly associated with this. Malignancy is a high concern; he has a h/o of renal cancer and now maybe having painless hematuria, has a 40 pack year smoking history; he has never had a colonoscopy. From chart review his last PSA was checked April 2018 and was 0.7. --CT abd/pel with just oral contrast due to CKD --address infection and BP as discussed below   Fall: Symptoms of dizziness prior to fall after getting up to urinate at night; denies losing consciousness or hitting his head. Small bruise on his left lateral hip; he states he has been able to ambulate without issues since fall. This likely presents orthostatic hypotension leading to fall as he is on CCB and alpha blocker. In the past there was concern for atrial fibrillation noted on 1 EKG read a few months ago, however he had follow up with his cardiologist who wasn't convinced of the diagnosis after reviewing the EKG. Telemetry overnight reveals PVCs, PACs - he denied palpitations since admission. TSH wnl last month. --f/u EKG --f/u orthostatics - positive; give gentle fluids --monitor on telemetry for arrythmias --consider echo if arrhythmias noted --hold nifedipine for now, change doxazosin for tamsulosin --PT/OT eval  UTI Balanitis: Urine culture growing >100K E coli last week which could be contributing to his presentation. Culture found to have resistance to bactrim and cipro. On exam today, patient had bleeding noted at glans penis, however unable to visualize focal skin lesions. He had been on nitrofurantoin which would not bacterial balanitis as initially per chart review it appeared he had a candidal infection; will change to keflex to address both. --Tx with keflex 500mg  BID x7d which will cover UTI and bacterial balanitis; notes penicillin allergy but doesn't remember reaction - denies angioedema or shortness of breath though  --Urology to evaluate patient as well  HTN: Patient on coreg 6.125 mg BID, nifedipine 60mg  qAM and 30mg  qPM (24hr formulation) though today he states he's been taking only 30mg  daily, and doxazosin 4mg  qhs. He reports frequent fluctuations of his blood pressure. --f/u orthostatics - positive --continue coreg 6.125mg  BID; hold nifedipine 30mg  daily; change doxazosin for tamsulosin as has less of an effect on BP --other considerations for change in therapy  are ACE/ARB (though with CKD 3 and nephrectomy this is not preferable), nondihydropyrimadines (has CAD but no noted HFrEF that I could find), HCTZ (GFR still >30) if needing better BP management.  BPH: On doxazosin; change to tamsulosin as less effect on BP  Hypothyroidism: On levothyroxine 75mg  daily. TSH last month was wnl. --continue levothyroxine 75mg  daily  MDD/Anxiety: Interested in trying SSRI again; had rash to buspar but tolerated sertraline for the couple of days he was taking it. --start sertraline 25mg  daily at discharge  Dispo: Admit patient to Inpatient with expected length of stay greater than 2 midnights.  Signed: Alphonzo Grieve, MD 03/19/2019, 8:20 AM

## 2019-03-19 NOTE — Progress Notes (Addendum)
  Progress Note    03/19/2019 8:52 AM  Subjective:  Complains of frequent urination.  Denies chest pain or SOB.   Vitals:   03/19/19 0342 03/19/19 0751  BP: (!) 150/66 (!) 135/51  Pulse: (!) 55 61  Resp: 12 17  Temp: 98.3 F (36.8 C) 97.8 F (36.6 C)  SpO2: 97% 99%   Physical Exam: Lungs:  Non labored Extremities:  Feet symmetrically warm Abdomen:  Soft, ND Neurologic: A&O  CBC    Component Value Date/Time   WBC 7.4 03/18/2019 1531   RBC 4.48 03/18/2019 1531   HGB 13.7 03/18/2019 1531   HCT 40.0 03/18/2019 1531   PLT 163 03/18/2019 1531   MCV 89.3 03/18/2019 1531   MCH 30.6 03/18/2019 1531   MCHC 34.3 03/18/2019 1531   RDW 13.2 03/18/2019 1531    BMET    Component Value Date/Time   NA 137 03/18/2019 1531   K 3.9 03/18/2019 1531   CL 105 03/18/2019 1531   CO2 24 03/18/2019 1531   GLUCOSE 114 (H) 03/18/2019 1531   BUN 18 03/18/2019 1531   CREATININE 1.41 (H) 03/18/2019 1531   CALCIUM 9.6 03/18/2019 1531   GFRNONAA 44 (L) 03/18/2019 1531   GFRAA 52 (L) 03/18/2019 1531    INR    Component Value Date/Time   INR 1.07 07/15/2009 1134     Intake/Output Summary (Last 24 hours) at 03/19/2019 5883 Last data filed at 03/18/2019 2254 Gross per 24 hour  Intake 360 ml  Output 1300 ml  Net -940 ml     Assessment/Plan:  85 y.o. male admitted from the office due to syncopal episode, recurrent UTIs, and overall failure to thrive  - UA showing many bacteria, culture pending; patient was scheduled for urology appt today as outpatient, we will consult to see pt while in house - Cardiology consulted to assist with workup given history of CAD, CHF, HTN - Hospitalist medicine also consulted - Perfusing BLE well, continue to monitor   Dagoberto Ligas, PA-C [facility] [phone] 03/19/2019 8:52 AM   I agree with the above.  I have seen and evaluated the patient.  He was admitted for failure to thrive as well as a recent fall.  He has been  dealing with a UTI.  His most recent UA showed many bacteria.  The culture is pending.  I have spoken with Dr. Gloriann Loan of urology who will evaluate the patient later today as the patient had a outpatient urology appointment today.  The teaching service is also been consulted for management of his medical issues.  I appreciate all the consultants assistance.  Annamarie Major

## 2019-03-19 NOTE — Progress Notes (Signed)
Initial Nutrition Assessment  DOCUMENTATION CODES:   Non-severe (moderate) malnutrition in context of acute illness/injury  INTERVENTION:    Ensure Enlive po TID, each supplement provides 350 kcal and 20 grams of protein  Provide MVI daily  NUTRITION DIAGNOSIS:   Moderate Malnutrition related to acute illness(recurrent UTI) as evidenced by energy intake < 75% for > 7 days, percent weight loss.  GOAL:   Patient will meet greater than or equal to 90% of their needs  MONITOR:   Weight trends, Supplement acceptance, PO intake, Labs, I & O's, Skin  REASON FOR ASSESSMENT:   Malnutrition Screening Tool    ASSESSMENT:   Patient with PMH significant for PVD s/p aortobifemoral bypass, bil renal artery bypass graft, right fem-pop bypass x2, carotid endarterectomy, HTN, CKD III, CAD, and renal cancer s/p partial right nephrectomy. Directly admitted by Vascular surgery due to failure to thrive and recurrent UTI.   RD working remotely.  Spoke with pt via phone. Reports having a decreased appetite three weeks PTA due to increased fatigue. States during this time his wife would cook him three meals that consisted of hamburger, chicken, broccoli, cauliflower, and corn bread. Completion of those meal decreased to 25-50%. Pt tried to add Ensure to his daily routine but was inconsistent in doing so. Discussed the importance of protein intake for preservation of lean body mass. Pt amendable to Ensure this stay.   Meal completions charted as 50-100%. Tolerated an egg and muffin for breakfast this am.   Pt endorses a UBW of 158 lb and an unintentional 20 lb wt loss over the last three weeks. Records indicate pt weighed 73.2 kg on 12/17/18 and 63 kg this admission (13.9% wt loss in three months, significant for time frame).   Medications: abx Labs: reviewed  Diet Order:   Diet Order            Diet regular Room service appropriate? Yes; Fluid consistency: Thin  Diet effective now               EDUCATION NEEDS:   Education needs have been addressed  Skin:  Skin Assessment: Reviewed RN Assessment  Last BM:  6/6  Height:   Ht Readings from Last 1 Encounters:  03/18/19 5\' 7"  (1.702 m)    Weight:   Wt Readings from Last 1 Encounters:  03/18/19 63 kg    Ideal Body Weight:  67.3 kg  BMI:  Body mass index is 21.77 kg/m.  Estimated Nutritional Needs:   Kcal:  2000-2200 kcal  Protein:  100-115 grams  Fluid:  >/= 2 L/day    Mariana Single RD, LDN Clinical Nutrition Pager # - [phone]

## 2019-03-20 DIAGNOSIS — N2889 Other specified disorders of kidney and ureter: Secondary | ICD-10-CM

## 2019-03-20 LAB — BASIC METABOLIC PANEL
Anion gap: 7 (ref 5–15)
BUN: 22 mg/dL (ref 8–23)
CO2: 25 mmol/L (ref 22–32)
Calcium: 9.8 mg/dL (ref 8.9–10.3)
Chloride: 108 mmol/L (ref 98–111)
Creatinine, Ser: 1.49 mg/dL — ABNORMAL HIGH (ref 0.61–1.24)
GFR calc Af Amer: 48 mL/min — ABNORMAL LOW (ref >60)
GFR calc non Af Amer: 42 mL/min — ABNORMAL LOW (ref >60)
Glucose, Bld: 100 mg/dL — ABNORMAL HIGH (ref 70–99)
Potassium: 4.5 mmol/L (ref 3.5–5.1)
Sodium: 140 mmol/L (ref 135–145)

## 2019-03-20 LAB — CBC
HCT: 38.9 % — ABNORMAL LOW (ref 39.0–52.0)
Hemoglobin: 13.1 g/dL (ref 13.0–17.0)
MCH: 30.1 pg (ref 26.0–34.0)
MCHC: 33.7 g/dL (ref 30.0–36.0)
MCV: 89.4 fL (ref 80.0–100.0)
Platelets: 161 10*3/uL (ref 150–400)
RBC: 4.35 MIL/uL (ref 4.22–5.81)
RDW: 13.2 % (ref 11.5–15.5)
WBC: 9 10*3/uL (ref 4.0–10.5)
nRBC: 0 % (ref 0.0–0.2)

## 2019-03-20 MED ORDER — ENOXAPARIN SODIUM 40 MG/0.4ML ~~LOC~~ SOLN: 40.0000 mg | SUBCUTANEOUS | Status: DC

## 2019-03-20 MED ORDER — TAMSULOSIN HCL 0.4 MG PO CAPS: 0.4000 mg | ORAL_CAPSULE | Freq: Every day | ORAL | 0 refills | Status: AC

## 2019-03-20 MED ORDER — SORBITOL 70 % SOLN
960.0000 mL | TOPICAL_OIL | Freq: Once | ORAL | Status: AC
  Administered 2019-03-20: 960 mL via RECTAL
  Filled 2019-03-20: qty 473

## 2019-03-20 MED ORDER — BISACODYL 10 MG RE SUPP
10.0000 mg | Freq: Once | RECTAL | Status: AC
  Administered 2019-03-20: 09:00:00 10 mg via RECTAL
  Filled 2019-03-20: qty 1

## 2019-03-20 MED ORDER — CLOTRIMAZOLE-BETAMETHASONE 1-0.05 % EX CREA: TOPICAL_CREAM | Freq: Two times a day (BID) | CUTANEOUS | 0 refills | Status: AC

## 2019-03-20 MED ORDER — CEPHALEXIN 500 MG PO CAPS: 500.0000 mg | ORAL_CAPSULE | Freq: Two times a day (BID) | ORAL | 0 refills | Status: DC

## 2019-03-20 NOTE — Progress Notes (Signed)
Patient spoken with over the phone by this RN that his prescription for Lotrisone cream was sent to his pharmacy.

## 2019-03-20 NOTE — Evaluation (Signed)
Occupational Therapy Evaluation and Discharge Patient Details Name: Tyler [name] MRN: [id number] DOB: 06/20/1929 Today's Date: 03/20/2019    History of Present Illness Pt is an 85 year old man admitted 03/18/19 with FTT, recent near syncopal event with fall suspected due to hypotension, hematuria (UTI vs renal cell carcinoma). PMH: renal cell carcinoma, PVD s/p aortobifemoral BPG, HTN, CKD III, CAD.   Clinical Impression   Pt is functioning modified independently in ADL and mobility. No OT needs.    Follow Up Recommendations  No OT follow up    Equipment Recommendations  None recommended by OT    Recommendations for Other Services       Precautions / Restrictions Precautions Precautions: None Restrictions Weight Bearing Restrictions: No      Mobility Bed Mobility               General bed mobility comments: pt received in chair  Transfers Overall transfer level: Modified independent Equipment used: Rolling walker (2 wheeled)                  Balance Overall balance assessment: Modified Independent                                         ADL either performed or assessed with clinical judgement   ADL Overall ADL's : Modified independent                                             Vision Patient Visual Report: No change from baseline       Perception     Praxis      Pertinent Vitals/Pain Pain Assessment: No/denies pain     Hand Dominance Right   Extremity/Trunk Assessment Upper Extremity Assessment Upper Extremity Assessment: Overall WFL for tasks assessed   Lower Extremity Assessment Lower Extremity Assessment: Overall WFL for tasks assessed       Communication Communication Communication: No difficulties, HOH   Cognition Arousal/Alertness: Awake/alert Behavior During Therapy: WFL for tasks assessed/performed Overall Cognitive Status: Within Functional Limits for tasks assessed                                  General Comments: pt focused on constipation and wanting bed alarm disarmed   General Comments       Exercises     Shoulder Instructions      Home Living Family/patient expects to be discharged to:: Private residence Living Arrangements: Spouse/significant other Available Help at Discharge: Family;Available 24 hours/day                         Home Equipment: Walker - 2 wheels          Prior Functioning/Environment Level of Independence: Independent with assistive device(s)                 OT Problem List:        OT Treatment/Interventions:      OT Goals(Current goals can be found in the care plan section) Acute Rehab OT Goals Patient Stated Goal: have a bowel movement  OT Frequency:     Barriers to D/C:  Co-evaluation PT/OT/SLP Co-Evaluation/Treatment: Yes Reason for Co-Treatment: For patient/therapist safety   OT goals addressed during session: ADL's and self-care      AM-PAC OT "6 Clicks" Daily Activity     Outcome Measure Help from another person eating meals?: None Help from another person taking care of personal grooming?: None Help from another person toileting, which includes using toliet, bedpan, or urinal?: None Help from another person bathing (including washing, rinsing, drying)?: None Help from another person to put on and taking off regular upper body clothing?: None Help from another person to put on and taking off regular lower body clothing?: None 6 Click Score: 24   End of Session Nurse Communication: Mobility status  Activity Tolerance: Patient tolerated treatment well Patient left: in chair;with call bell/phone within reach  OT Visit Diagnosis: Other abnormalities of gait and mobility (R26.89)                Time: 9024-0973 OT Time Calculation (min): 17 min Charges:  OT General Charges $OT Visit: 1 Visit OT Evaluation $OT Eval Low Complexity: 1 Low  Malka So 03/20/2019, 1:11 PM  Nestor Lewandowsky, OTR/L Acute Rehabilitation Services Pager: [phone] Office: [phone]

## 2019-03-20 NOTE — Progress Notes (Signed)
   Subjective:   Patient said that he has not had a bowel movement since Saturday. He was able to walk around last night with some assistance. He has had a good appetite. No other acute complaints.   Objective:  Vital signs in last 24 hours: Vitals:   03/19/19 1828 03/19/19 1932 03/20/19 0550 03/20/19 0730  BP:  (!) 123/56 (!) 153/70 138/61  Pulse:  63 (!) 57 (!) 52  Resp: 17 17 18 15   Temp:  97.9 F (36.6 C) (!) 97.3 F (36.3 C) 97.6 F (36.4 C)  TempSrc:  Oral Oral Oral  SpO2:  97% 98% 98%  Weight:      Height:       General: Sitting up in the chair no acute distress Cardiovascular: Normal rate, regular rhythm Respiratory: Clear to auscultation bilaterally, normal work of breathing Psych: Normal affect, mood, behavior  Assessment/Plan:  Active Problems:   HTN (hypertension)   Failure to thrive (0-17)   Syncope   Hypothyroidism   BPH (benign prostatic hyperplasia)   UTI (urinary tract infection)   Malnutrition of moderate degree  Near syncopal event with fall: Likely due to orthostatic hypotension secondary to decreased poor oral intake versus a vasovagal event.  Blood pressures have improved to 130's to 150's with IV fluids.  Telemetry normal. No symptoms this morning. - Follow-up PT/OT eval - We will continue holding home nifedipine at discharge.  Continue Coreg 6.125 mg twice daily for now.  He will need to follow-up with his PCP as an outpatient for further blood pressure medication management.  Right renal mass: intermediate density 3 cm exophytic right renal lesion; ddx includes small solid mass (hx of renal cell carcinoma) or a proteinaceous cyst. Will follow-up urology recs for further evaluation.  Painless hematuria: It may be related to the right renal mass versus UTI versus bladder cancer (extensive tobacco use history).  He will need outpatient follow-up for possible cystoscopy.  UTI, balanitis: Currently on Keflex 500 mg twice daily for 7 days.  Urology  consulted and recommended possible circumcision versus other operative management as an outpatient.  BPH: - Continue tamsulosin  Dispo: Anticipated discharge date.  Carroll Sage, MD 03/20/2019, 7:43 AM Pager: Pager: [phone]

## 2019-03-20 NOTE — Progress Notes (Signed)
    Subjective  -   Wants to have a bowel movement.  Frustrated he has not moved his bowels since Saturday.  Suppository was not successful   Physical Exam:  Abdomen soft Respirations nonlabored Extremities warm and well-perfused   I have reviewed his CT scan with the following findings 1. There is an indeterminate, intermediate density 3 cm exophytic right renal lesion which could be a small solid mass or a proteinaceous cyst. But there is no evidence of active malignancy in the non-contrast chest, abdomen, or pelvis. Occasional small sclerotic bone lesions are most likely benign bone islands. 2. Aortic Atherosclerosis (ICD10-I70.0) with aortic, iliac and femoral bypass. Calcified coronary artery atherosclerosis. Vascular patency is not evaluated in the absence of IV contrast. 3. Mild lung scarring and bronchiectasis. Extensive diverticulosis of the large bowel. 4. Bladder diverticula.   Assessment/Plan:    --Constipation: The patient is going to try a enema later today --Urology: Appreciate input.  Considering outpatient circumcision versus dorsal slit.  The patient would really like to address all issues while inpatient since he has transportation issues --UTI: Remains on antibiotics.  Cultures growing gram-negative rods --Appreciate teaching service assistance --I spoke with his wife last night and updated her.  Tyler [name] 03/20/2019 2:49 PM --  Vitals:   03/20/19 0845 03/20/19 1159  BP:  124/69  Pulse: 87 62  Resp:  20  Temp:  97.9 F (36.6 C)  SpO2:  99%    Intake/Output Summary (Last 24 hours) at 03/20/2019 1449 Last data filed at 03/20/2019 1434 Gross per 24 hour  Intake 1368.5 ml  Output 1310 ml  Net 58.5 ml     Laboratory CBC    Component Value Date/Time   WBC 9.0 03/20/2019 0331   HGB 13.1 03/20/2019 0331   HCT 38.9 (L) 03/20/2019 0331   PLT 161 03/20/2019 0331    BMET    Component Value Date/Time   NA 140 03/20/2019 0331   K 4.5  03/20/2019 0331   CL 108 03/20/2019 0331   CO2 25 03/20/2019 0331   GLUCOSE 100 (H) 03/20/2019 0331   BUN 22 03/20/2019 0331   CREATININE 1.49 (H) 03/20/2019 0331   CALCIUM 9.8 03/20/2019 0331   GFRNONAA 42 (L) 03/20/2019 0331   GFRAA 48 (L) 03/20/2019 0331    COAG Lab Results  Component Value Date   INR 1.07 07/15/2009   No results found for: PTT  Antibiotics Anti-infectives (From admission, onward)   Start     Dose/Rate Route Frequency Ordered Stop   03/20/19 0000  cephALEXin (KEFLEX) 500 MG capsule     500 mg Oral Every 12 hours 03/20/19 1421     03/19/19 1030  cephALEXin (KEFLEX) capsule 500 mg     500 mg Oral Every 12 hours 03/19/19 1015 03/22/19 2159       V. Leia Alf, M.D., [facility] [place] Office: [phone] Pager:  [phone]

## 2019-03-20 NOTE — H&P (Signed)
[facility]  Patient name: Tyler [name]            MRN: [id number]        DOB: 10-13-1928          Sex: male   REASON FOR VISIT:    Follow up  [place]:    The patient is back today for followup. He is a former patient of Dr. Amedeo Plenty. He is status post complex aortic reconstruction with an aortobifemoral bypass graft and bilateral renal artery bypass graft. He is also status post right femoral-popliteal bypass graft in 1995 and 2003. I performed a right carotid endarterectomy for asymptomatic stenosis. 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 He underwent angiography on 04/15/2014 to better define this. I found approximately 80% stenosis within the distal limb of the aortobifemoral graft. On 05/27/2014, he underwent drug coated balloon angioplasty of what appeared to be a stenosis within the limb of his aortobifemoral graft. There was a residual stenosis after angioplasty.  The patient has multiple complaints today.  He has been having intermittent urinary tract infections.  He continues to complain of dysuria and polyuria.  He has had trouble with his blood pressure both hypertension and hypotension.  He has had a 20 pound weight loss.  He passed out this past Friday night.  He has been started on SSRI medication however had to discontinue it secondary to hives as well as tachycardia.   PAST MEDICAL HISTORY:       Past Medical History:  Diagnosis Date  . Cancer ([place])    kidney  . Carotid artery occlusion   . CHF (congestive heart failure) ([place])   . COPD (chronic obstructive pulmonary disease) ([place])   . Coronary artery disease   . DVT (deep venous thrombosis) ([place])   . Hyperlipidemia   . Hypertension   . Myocardial infarction ([place]) 2009  . Peripheral vascular disease ([place])   . Ulcer      FAMILY HISTORY:        Family History  Problem Relation Age of Onset  .  Heart disease Father        Aneurysm, Abdominal Aortic  . Hypertension Other   . Coronary artery disease Other   . Alzheimer's disease Other   . Other Other        AAA  . Arthritis Other   . Diabetes Other   . Seizures Other     SOCIAL HISTORY:   Social History        Tobacco Use  . Smoking status: Former Smoker    Packs/day: 1.00    Years: 40.00    Pack years: 40.00    Types: Cigarettes    Last attempt to quit: 10/11/1999    Years since quitting: 19.4  . Smokeless tobacco: Never Used  Substance Use Topics  . Alcohol use: No    Alcohol/week: 0.0 standard drinks     ALLERGIES:        Allergies  Allergen Reactions  . Buspirone Itching and Rash  . Penicillins Swelling and Rash    Has patient had a PCN reaction causing immediate rash, facial/tongue/throat swelling, SOB or lightheadedness with hypotension: Yes Has patient had a PCN reaction causing severe rash involving mucus membranes or skin necrosis: No Has patient had a PCN reaction that required hospitalization No Has patient had a PCN reaction occurring within the last 10 years: No If all of the above answers  are "NO", then may proceed with Cephalosporin use.   . Other Other (See Comments)    TEARS AND BRUISES SKIN- PLEASE USE AN ALTERNATIVE!!  . Adhesive [Tape] Other (See Comments)    TEARS AND BRUISES SKIN- PLEASE USE AN ALTERNATIVE!!     CURRENT MEDICATIONS:         Current Outpatient Medications  Medication Sig Dispense Refill  . aspirin EC 81 MG tablet Take 81 mg by mouth daily.      . Carboxymethylcellulose Sod PF 0.25 % SOLN Place 1-2 drops into both eyes 3 (three) times daily as needed (for dryness).    . carvedilol (COREG) 12.5 MG tablet Take 6.25 mg by mouth 2 (two) times daily with a meal.    . dicyclomine (BENTYL) 10 MG capsule Take 20 mg by mouth 2 (two) times a day.     . doxazosin (CARDURA) 8 MG tablet Take 4 mg by mouth at bedtime.     .  furosemide (LASIX) 40 MG tablet Take 40 mg by mouth every other day.    . levothyroxine (SYNTHROID) 75 MCG tablet Take 75 mcg by mouth daily before breakfast.    . Multiple Vitamins-Minerals (ONE-A-DAY MENS 50+ ADVANTAGE) TABS Take 1 tablet by mouth daily with breakfast.    . NIFEdipine (PROCARDIA XL/NIFEDICAL XL) 60 MG 24 hr tablet Take 1 tablet (60 mg total) by mouth 2 (two) times a day. (Patient taking differently: Take 30 mg by mouth 2 (two) times a day. ) 30 tablet 0  . omeprazole (PRILOSEC) 20 MG capsule Take 20 mg by mouth at bedtime.     . simvastatin (ZOCOR) 20 MG tablet Take 20 mg by mouth at bedtime.      . nitroGLYCERIN (NITRODUR - DOSED IN MG/24 HR) 0.4 mg/hr patch Place 0.4 mg onto the skin every other day.      No current facility-administered medications for this visit.     REVIEW OF SYSTEMS:   [X]  denotes positive finding, [ ]  denotes negative finding Cardiac  Comments:  Chest pain or chest pressure:    Shortness of breath upon exertion:    Short of breath when lying flat:    Irregular heart rhythm:        Vascular    Pain in calf, thigh, or hip brought on by ambulation: x   Pain in feet at night that wakes you up from your sleep:     Blood clot in your veins:    Leg swelling:         Pulmonary    Oxygen at home:    Productive cough:     Wheezing:         Neurologic    Sudden weakness in arms or legs:     Sudden numbness in arms or legs:     Sudden onset of difficulty speaking or slurred speech:    Temporary loss of vision in one eye:     Problems with dizziness:         Gastrointestinal    Blood in stool:     Vomited blood:         Genitourinary    Burning when urinating:  x   Blood in urine:        Psychiatric    Major depression:         Hematologic    Bleeding problems:    Problems with blood clotting too easily:        Skin  Rashes or  ulcers:        Constitutional    Fever or chills:      PHYSICAL EXAM:      Vitals:   03/18/19 1105  Resp: 14  Weight: 63 kg  Height: 5\' 7"  (1.702 m)    GENERAL: The patient is a well-nourished male, in no acute distress. The vital signs are documented above. CARDIAC: There is a regular rate and rhythm.  PULMONARY: Non-labored respirations ABDOMEN: Soft and non-tender with normal pitched bowel sounds.  MUSCULOSKELETAL: There are no major deformities or cyanosis. NEUROLOGIC: No focal weakness or paresthesias are detected. SKIN: There are no ulcers or rashes noted. PSYCHIATRIC: The patient has a normal affect.  STUDIES:   I have ordered and reviewed his u/s with the following findings: Right Carotid: Non-hemodynamically significant plaque <50% noted in the CCA. Patent right carotid endarterectomy with plaque formation without evidence of restenosis.  Left Carotid: Velocities in the left ICA are consistent with a 1-39% stenosis. Non-hemodynamically significant plaque noted in the CCA. The ECA appears <50% stenosed.  ABI/TBIToday's ABIToday's TBIPrevious ABIPrevious TBI +-------+-----------+-----------+------------+------------+ Right 0.96 0.90 1.03 0.64  +-------+-----------+-----------+------------+------------+ Left 0.76 0.86 0.78 0.36  +-------+-----------+-----------+------------+------------+  Stenosis: Patent aortobifemoral bypass graft with >70% stenosis involving the distal anastomosis of the left limb.  MEDICAL ISSUES:   I have known the patient for quite some time and this is the worst he has appeared in the office.  His wife is very concerned about him.  They are also very frustrated and concerned that something is wrong but it nothing is being done about it.  I have therefore decided to admit him to the hospital  for a medical work-up.  I am admitting him under my service so as to avoid going through the emergency department.  Most of his issues are medical.   He was scheduled to have an appointment with [facility].  We will contact them so that he can be visited while in the hospital.  He feels that he has something wrong with his heart.  Cardiology consultation will be obtained.  He will need a syncopal work-up.  His carotid Doppler studies were within normal limits.    Leia Alf, MD, FACS [facility] [phone] Pager [phone]

## 2019-03-20 NOTE — Progress Notes (Signed)
Patient in a stable condition discharge eduction reviewed with patient he verbalized understanding, iv removed,tele dc,  ccmd notified, patient belongings at bedside. Patient transported home by his son. Patient left the unit for home before verbal order was received from MD to give Lovenox.

## 2019-03-20 NOTE — Discharge Summary (Signed)
Name: Tyler [name] MRN: [id number] DOB: 05-14-1929 85 y.o. PCP: Wayland Salinas, MD  Date of Admission: 03/18/2019  1:33 PM Date of Discharge: 03/20/2019 Attending Physician: Aldine Contes, MD  Discharge Diagnosis: 1. Near-syncope 2. Right renal mass 3. Painless hematuria 4. UTI 5. Balanitis 6. BPH 7. Hypothyroidism  Discharge Medications: Allergies as of 03/20/2019      Reactions   Adhesive [tape] Other (See Comments)   TEARS AND BRUISES SKIN- PLEASE USE AN ALTERNATIVE LIKE COBAN WRAP!!   Buspirone Hives, Itching, Rash, Hypertension   Penicillins Swelling, Rash   Has patient had a PCN reaction causing immediate rash, facial/tongue/throat swelling, SOB or lightheadedness with hypotension: Yes Has patient had a PCN reaction causing severe rash involving mucus membranes or skin necrosis: No Has patient had a PCN reaction that required hospitalization No Has patient had a PCN reaction occurring within the last 10 years: No If all of the above answers are "NO", then may proceed with Cephalosporin use.      Medication List    STOP taking these medications   doxazosin 8 MG tablet Commonly known as: CARDURA   furosemide 40 MG tablet Commonly known as: LASIX   NIFEdipine 30 MG 24 hr tablet Commonly known as: ADALAT CC   nitroGLYCERIN 0.4 mg/hr patch Commonly known as: NITRODUR - Dosed in mg/24 hr     TAKE these medications   aspirin EC 81 MG tablet Take 81 mg by mouth daily.   carvedilol 12.5 MG tablet Commonly known as: COREG Take 6.25 mg by mouth 2 (two) times daily with a meal.   cephALEXin 500 MG capsule Commonly known as: KEFLEX Take 1 capsule (500 mg total) by mouth every 12 (twelve) hours.   clotrimazole-betamethasone cream Commonly known as: LOTRISONE Apply topically 2 (two) times daily.   dicyclomine 10 MG capsule Commonly known as: BENTYL Take 20 mg by mouth 2 (two) times a day.   levothyroxine 75 MCG tablet Commonly known as: SYNTHROID  Take 75 mcg by mouth daily before breakfast.   nystatin cream Commonly known as: MYCOSTATIN Apply 1 application topically See admin instructions. Apply to penis three times a day   omeprazole 20 MG capsule Commonly known as: PRILOSEC Take 20 mg by mouth at bedtime.   One-A-Day Mens 50+ Advantage Tabs Take 1 tablet by mouth daily with breakfast.   Refresh Tears 0.5 % Soln Generic drug: carboxymethylcellulose Place 1-2 drops into both eyes See admin instructions. Instill 1-2 drops into each eye 2 times a day (morning and bedtime) and an additional 1-2 drops into each eye daily as needed for dryness   simvastatin 20 MG tablet Commonly known as: ZOCOR Take 20 mg by mouth at bedtime.   tamsulosin 0.4 MG Caps capsule Commonly known as: FLOMAX Take 1 capsule (0.4 mg total) by mouth daily after supper.       Disposition and follow-up:   Mr.Tyler [name] was discharged from [facility] in Stable condition.  At the hospital follow up visit please address:  1.  Assess for signs of orthostatic hypotension.  His nifedipine was held at discharge.  Please modify antihypertensive regiment as needed.  2.  Please ensure that he is able to follow-up with urology for likely outpatient circumcision and cystoscopy.  3.  Labs / imaging needed at time of follow-up: None  4.  Pending labs/ test needing follow-up: None  Follow-up Appointments: Instructed to call his PCP Dr. Leonides Cave to make a hospital follow-up appointment  within 1-2 weeks.  Hospital Course by problem list: 1. Near-syncope: [name] presented to his vascular surgeon with decreased appetite and weight loss over the last 3 to 4 months and one episode of near syncope and fall at home.  His near syncope was likely secondary to orthostatic hypotension secondary to his decreased oral intake versus a vasovagal syndrome.  He was treated with IV fluids during his admission.  Due to his increasing weight loss a CT  chest/abdomen/pelvis was performed to evaluate for malignancy.  Imaging revealed an indeterminate density 3 cm exophytic right renal lesion.  Urology reviewed images and recommended outpatient follow-up which will likely include observation only.  He was discharged home in stable condition.  2. Right renal mass: Exophytic right renal lesion incidentally found on CT.  We will follow-up with urology.  3. Painless hematuria: Incidental finding of hematuria on urinalysis.  This may be secondary to his underlying urinary tract infection versus malignancy.  He will follow-up with urology as an outpatient for possible cystoscopy.  4. UTI: Analysis consistent with a urinary tract infection.  He was started on Keflex and will need to continue a total of a 7-day course.  5. Balanitis: We will follow-up with urology for likely outpatient circumcision.  6. BPH: His home doxazosin was switched to tamsulosin as this medication is less of an effect on his blood pressure.  He was discharged on tamsulosin.   7. Hypertension: Was on coreg 6.125 mg BID and nifedipine 30 mg daily.  He was asked to hold his nifedipine at discharge as his blood pressures were well controlled on Coreg alone.  8.  Hypothyroidism: TSH in May 2020 was within normal limits.  He was continued on his home levothyroxine 75 mcg daily.  Discharge Vitals:   BP 138/61 (BP Location: Right Arm)   Pulse 87   Temp 97.6 F (36.4 C) (Oral)   Resp 15   Ht 5\' 7"  (1.702 m)   Wt 63 kg   SpO2 98%   BMI 21.77 kg/m   Pertinent Labs, Studies, and Procedures:  03/20/19 Chest CT/Abd/Pelvis: 1. There is an indeterminate, intermediate density 3 cm exophytic right renal lesion which could be a small solid mass or a proteinaceous cyst. But there is no evidence of active malignancy in the non-contrast chest, abdomen, or pelvis. Occasional small sclerotic bone lesions are most likely benign bone islands. 2. Aortic Atherosclerosis (ICD10-I70.0) with aortic,  iliac and femoral bypass. Calcified coronary artery atherosclerosis. Vascular patency is not evaluated in the absence of IV contrast. 3. Mild lung scarring and bronchiectasis. Extensive diverticulosis of the large bowel. 4. Bladder diverticula.  Discharge Instructions:   Thank you so much for allowing [place] to care for you during admission. You are found to have a urinary tract infection and will be important to continue your antibiotics twice a day for the next 5 days.   You were also found to have a 3 cm renal lesion which may be a cyst versus another type of mass. It would be important for you to follow-up with urology so that they can further evaluate this lesion.  I have also discontinued several of your blood pressure medicines because you were found to be deep very dehydrated and with low blood pressure. Please stop taking nifedipine, furosemide, and nitroglycerin patch until you able to follow-up with your primary care doctor and have your blood pressure rechecked.  If you systolic blood pressure (top number) stays consistently above 180 (more than 1 reading)  please restart your Nifedipine or call your PCP for further recommendations.  Signed: Carroll Sage, MD 03/20/2019, 11:33 AM   Pager: [phone]

## 2019-03-20 NOTE — Evaluation (Signed)
Physical Therapy Evaluation Patient Details Name: Tyler Tyler [name] MRN: [id number] DOB: 1928-11-03 Today's Date: 03/20/2019   History of Present Illness  Pt is an 85 year old man admitted 03/18/19 with FTT, recent near syncopal event with fall suspected due to hypotension, hematuria (UTI vs renal cell carcinoma). PMH: renal cell carcinoma, PVD s/p aortobifemoral BPG, HTN, CKD III, CAD.    Clinical Impression  Patient admitted with the above listed diagnosis. Patient reports Mod I with mobility and ADLs prior to admission. Patient today performing functional mobility with Mod I with use of RW. Able to navigate obstacles and room without LOB. No further acute PT needs identified. PT to sign off. Please re-consult if needed.      Follow Up Recommendations No PT follow up    Equipment Recommendations  None recommended by PT    Recommendations for Other Services       Precautions / Restrictions Precautions Precautions: None Restrictions Weight Bearing Restrictions: No      Mobility  Bed Mobility               General bed mobility comments: pt received in chair  Transfers Overall transfer level: Modified independent Equipment used: Rolling walker (2 wheeled)                Ambulation/Gait Ambulation/Gait assistance: Modified independent (Device/Increase time) Gait Distance (Feet): 300 Feet Assistive device: Rolling walker (2 wheeled) Gait Pattern/deviations: Step-through pattern;Decreased stride length Gait velocity: WNL   General Gait Details: steady pace of gait with RW - no LOB or instability; able to navigate obstacles without difficulty  Stairs            Wheelchair Mobility    Modified Rankin (Stroke Patients Only)       Balance Overall balance assessment: Modified Independent                                           Pertinent Vitals/Pain Pain Assessment: No/denies pain    Home Living Family/patient expects to be  discharged to:: Private residence Living Arrangements: Spouse/significant other Available Help at Discharge: Family;Available 24 hours/day           Home Equipment: Walker - 2 wheels      Prior Function Level of Independence: Independent with assistive device(s)               Hand Dominance   Dominant Hand: Right    Extremity/Trunk Assessment   Upper Extremity Assessment Upper Extremity Assessment: Overall WFL for tasks assessed    Lower Extremity Assessment Lower Extremity Assessment: Overall WFL for tasks assessed    Cervical / Trunk Assessment Cervical / Trunk Assessment: Kyphotic  Communication   Communication: No difficulties;HOH  Cognition Arousal/Alertness: Awake/alert Behavior During Therapy: WFL for tasks assessed/performed Overall Cognitive Status: Within Functional Limits for tasks assessed                                 General Comments: pt focused Tyler [name] constipation and wanting bed alarm disarmed      General Comments      Exercises     Assessment/Plan    PT Assessment Patent does not need any further PT services  PT Problem List         PT Treatment Interventions      PT Goals (  Current goals can be found in the Care Plan section)  Acute Rehab PT Goals Patient Stated Goal: have a bowel movement PT Goal Formulation: All assessment and education complete, DC therapy Time For Goal Achievement: 03/20/19 Potential to Achieve Goals: Good    Frequency     Barriers to discharge        Co-evaluation PT/OT/SLP Co-Evaluation/Treatment: Yes Reason for Co-Treatment: For patient/therapist safety;To address functional/ADL transfers PT goals addressed during session: Mobility/safety with mobility;Balance;Proper use of DME OT goals addressed during session: ADL's and self-care       AM-PAC PT "6 Clicks" Mobility  Outcome Measure Help needed turning from your back to your side while in a flat bed without using bedrails?:  None Help needed moving from lying Tyler [name] your back to sitting Tyler [name] the side of a flat bed without using bedrails?: None Help needed moving to and from a bed to a chair (including a wheelchair)?: None Help needed standing up from a chair using your arms (e.g., wheelchair or bedside chair)?: None Help needed to walk in hospital room?: None Help needed climbing 3-5 steps with a railing? : A Little 6 Click Score: 23    End of Session Equipment Utilized During Treatment: Gait belt Activity Tolerance: Patient tolerated treatment well Patient left: in chair;with call bell/phone within reach Nurse Communication: Mobility status PT Visit Diagnosis: Unsteadiness Tyler [name] feet (R26.81)    Time: 0947-0962 PT Time Calculation (min) (ACUTE ONLY): 17 min   Charges:   PT Evaluation $PT Eval Low Complexity: 1 Low          Tyler Tyler [name], PT, DPT Supplemental Physical Therapist 03/20/19 2:33 PM Pager: [phone] Office: [phone]

## 2019-03-21 ENCOUNTER — Telehealth: Payer: Self-pay | Admitting: Internal Medicine

## 2019-03-21 ENCOUNTER — Telehealth: Payer: Self-pay

## 2019-03-21 LAB — URINE CULTURE: Culture: 40000 — AB

## 2019-03-21 NOTE — Telephone Encounter (Signed)
Pt called and mainly wants to speak to dr Alfonse Spruce, he states he would like to stop taking flomax and start back on his cardura, reasons being: he goes to the bathroom all the time on flomax and he thinks the flomax makes him weak and generally tired also he thinks it disturbs his blood pressure, triage nurse explained that usually cardura will effect bp . He states he would like to start his nifedipine back also. He stated the nurse was nice to him but he would rather talk to dr Alfonse Spruce and if she knew he was on the phone she would come talk to him. He would like dr Alfonse Spruce to call him this afternoon, not tomorrow at  [phone] Or [phone]

## 2019-03-21 NOTE — Telephone Encounter (Signed)
Pt D/C yesterday afternoon. Please call patient's wife.  Pt is not feeling well, weak, and has questions about his medications.

## 2019-03-21 NOTE — Telephone Encounter (Signed)
Daughter called and said that her dad was in the hospital for 3 days and just came home. She said that he is not feeling well and has been sick. Thinks that it may have to do with a medication that the internist there prescribed. Said that they are having a hard time reaching the internist.   Called and advised daughter that once the patient leaves the hospital they are managed by PCP for medications that have been started. She was advised to call his PCP to discuss.   York Cerise, CMA

## 2019-04-03 ENCOUNTER — Other Ambulatory Visit: Payer: Medicare Other

## 2019-04-29 ENCOUNTER — Other Ambulatory Visit: Payer: Medicare Other

## 2019-05-13 ENCOUNTER — Other Ambulatory Visit: Payer: Medicare Other

## 2020-01-20 ENCOUNTER — Ambulatory Visit: Payer: Medicare Other | Admitting: Surgery

## 2020-01-20 ENCOUNTER — Encounter (HOSPITAL_COMMUNITY): Payer: Medicare Other

## 2020-01-27 ENCOUNTER — Encounter (HOSPITAL_COMMUNITY): Payer: Medicare Other

## 2020-01-27 ENCOUNTER — Ambulatory Visit: Payer: Medicare Other | Admitting: Surgery

## 2020-02-19 ENCOUNTER — Other Ambulatory Visit: Payer: Self-pay | Admitting: *Deleted

## 2020-02-19 DIAGNOSIS — I739 Peripheral vascular disease, unspecified: Secondary | ICD-10-CM

## 2020-02-21 ENCOUNTER — Telehealth (HOSPITAL_COMMUNITY): Payer: Self-pay

## 2020-02-21 NOTE — Telephone Encounter (Signed)

## 2020-02-24 ENCOUNTER — Ambulatory Visit (INDEPENDENT_AMBULATORY_CARE_PROVIDER_SITE_OTHER): Payer: Medicare Other | Admitting: Surgery

## 2020-02-24 ENCOUNTER — Encounter: Payer: Self-pay | Admitting: Surgery

## 2020-02-24 ENCOUNTER — Other Ambulatory Visit: Payer: Self-pay | Admitting: *Deleted

## 2020-02-24 ENCOUNTER — Ambulatory Visit (HOSPITAL_COMMUNITY)
Admission: RE | Admit: 2020-02-24 | Discharge: 2020-02-24 | Disposition: A | Discharge status: Home / Self Care | Payer: Medicare Other | Source: Ambulatory Visit | Attending: Surgery | Admitting: Surgery

## 2020-02-24 ENCOUNTER — Ambulatory Visit (INDEPENDENT_AMBULATORY_CARE_PROVIDER_SITE_OTHER)
Admission: RE | Admit: 2020-02-24 | Discharge: 2020-02-24 | Disposition: A | Discharge status: Home / Self Care | Payer: Medicare Other | Source: Ambulatory Visit | Attending: Surgery | Admitting: Surgery

## 2020-02-24 ENCOUNTER — Other Ambulatory Visit: Payer: Self-pay

## 2020-02-24 VITALS — BP 167/68 | HR 69 | Temp 98.0°F | Resp 20 | Ht 67.0 in | Wt 153.0 lb

## 2020-02-24 DIAGNOSIS — I70213 Atherosclerosis of native arteries of extremities with intermittent claudication, bilateral legs: Secondary | ICD-10-CM

## 2020-02-24 DIAGNOSIS — I739 Peripheral vascular disease, unspecified: Secondary | ICD-10-CM

## 2020-02-24 DIAGNOSIS — I7409 Other arterial embolism and thrombosis of abdominal aorta: Secondary | ICD-10-CM | POA: Insufficient documentation

## 2020-02-24 DIAGNOSIS — I6523 Occlusion and stenosis of bilateral carotid arteries: Secondary | ICD-10-CM

## 2020-02-24 NOTE — Progress Notes (Signed)
Vascular and Vein Specialist of [facility]  Patient name: Tyler [name] MRN: [id number] DOB: 1929/03/03 Sex: male   REASON FOR VISIT:    Follow up  [place]:    The patient is back today for followup. He is a former patient of Dr. Amedeo Plenty. He is status post complex aortic reconstruction with an aortobifemoral bypass graft and bilateral renal artery bypass graft. He is also status post right femoral-popliteal bypass graft in 1995 and 2003. I performed a right carotid endarterectomy for asymptomatic stenosis. 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 He underwent angiography on 04/15/2014 to better define this. I found approximately 80% stenosis within the distal limb of the aortobifemoral graft. On 05/27/2014, he underwent drug coated balloon angioplasty of what appeared to be a stenosis within the limb of his aortobifemoral graft. There was a residual stenosis after angioplasty.  He denies having any significant claudication symptoms.  He does not have rest pain or nonhealing wounds.  He is now taking Eliquis for atrial fibrillation.  He is on a statin for hypercholesterolemia.  He is medically managed for hypertension.  PAST MEDICAL HISTORY:   Past Medical History:  Diagnosis Date  . Cancer ([place])    kidney  . Carotid artery occlusion   . CHF (congestive heart failure) ([place])   . COPD (chronic obstructive pulmonary disease) ([place])   . Coronary artery disease   . DVT (deep venous thrombosis) ([place])   . Failure to thrive (0-17) 03/2019  . Hyperlipidemia   . Hypertension   . Myocardial infarction ([place]) 2009  . Peripheral vascular disease ([place])   . Ulcer      FAMILY HISTORY:   Family History  Problem Relation Age of Onset  . Heart disease Father        Aneurysm, Abdominal Aortic  . Hypertension Other   . Coronary artery disease Other   . Alzheimer's disease Other   . Other Other        AAA  .  Arthritis Other   . Diabetes Other   . Seizures Other     SOCIAL HISTORY:   Social History   Tobacco Use  . Smoking status: Former Smoker    Packs/day: 1.00    Years: 40.00    Pack years: 40.00    Types: Cigarettes    Quit date: 10/11/1999    Years since quitting: 20.3  . Smokeless tobacco: Never Used  Substance Use Topics  . Alcohol use: No    Alcohol/week: 0.0 standard drinks     ALLERGIES:   Allergies  Allergen Reactions  . Adhesive [Tape] Other (See Comments)    TEARS AND BRUISES SKIN- PLEASE USE AN ALTERNATIVE LIKE COBAN WRAP!!  . Buspirone Hives, Itching, Rash and Hypertension  . Penicillins Swelling and Rash    Has patient had a PCN reaction causing immediate rash, facial/tongue/throat swelling, SOB or lightheadedness with hypotension: Yes Has patient had a PCN reaction causing severe rash involving mucus membranes or skin necrosis: No Has patient had a PCN reaction that required hospitalization No Has patient had a PCN reaction occurring within the last 10 years: No If all of the above answers are "NO", then may proceed with Cephalosporin use.      CURRENT MEDICATIONS:   Current Outpatient Medications  Medication Sig Dispense Refill  . amiodarone (PACERONE) 200 MG tablet Take 100 mg by mouth every other day.     Marland Kitchen apixaban (ELIQUIS) 2.5 MG TABS tablet Take  by mouth.    Marland Kitchen aspirin EC 81 MG tablet Take 81 mg by mouth daily.      . carboxymethylcellulose (REFRESH TEARS) 0.5 % SOLN Place 1-2 drops into both eyes See admin instructions. Instill 1-2 drops into each eye 2 times a day (morning and bedtime) and an additional 1-2 drops into each eye daily as needed for dryness    . clotrimazole-betamethasone (LOTRISONE) cream Apply topically 2 (two) times daily. 30 g 0  . dicyclomine (BENTYL) 10 MG capsule Take 20 mg by mouth 2 (two) times a day.     . finasteride (PROSCAR) 5 MG tablet Take by mouth.    . isosorbide mononitrate (IMDUR) 30 MG 24 hr tablet Take by mouth.     . levothyroxine (SYNTHROID) 75 MCG tablet Take 75 mcg by mouth daily before breakfast.    . Multiple Vitamins-Minerals (ONE-A-DAY MENS 50+ ADVANTAGE) TABS Take 1 tablet by mouth daily with breakfast.    . nystatin cream (MYCOSTATIN) Apply 1 application topically See admin instructions. Apply to penis three times a day    . omeprazole (PRILOSEC) 20 MG capsule Take 20 mg by mouth at bedtime.     . rosuvastatin (CRESTOR) 20 MG tablet Take by mouth.    . tamsulosin (FLOMAX) 0.4 MG CAPS capsule Take 1 capsule (0.4 mg total) by mouth daily after supper. 30 capsule 0  . carvedilol (COREG) 12.5 MG tablet Take 6.25 mg by mouth 2 (two) times daily with a meal.     No current facility-administered medications for this visit.    REVIEW OF SYSTEMS:   [X]  denotes positive finding, [ ]  denotes negative finding Cardiac  Comments:  Chest pain or chest pressure:    Shortness of breath upon exertion:    Short of breath when lying flat:    Irregular heart rhythm:        Vascular    Pain in calf, thigh, or hip brought on by ambulation:    Pain in feet at night that wakes you up from your sleep:     Blood clot in your veins:    Leg swelling:         Pulmonary    Oxygen at home:    Productive cough:     Wheezing:         Neurologic    Sudden weakness in arms or legs:     Sudden numbness in arms or legs:     Sudden onset of difficulty speaking or slurred speech:    Temporary loss of vision in one eye:     Problems with dizziness:         Gastrointestinal    Blood in stool:     Vomited blood:         Genitourinary    Burning when urinating:     Blood in urine:        Psychiatric    Major depression:         Hematologic    Bleeding problems:    Problems with blood clotting too easily:        Skin    Rashes or ulcers:        Constitutional    Fever or chills:      PHYSICAL EXAM:   Vitals:   02/24/20 1411  BP: (!) 167/68  Pulse: 69  Resp: 20  Temp: 98 F (36.7 C)  SpO2:  97%  Weight: 153 lb (69.4 kg)  Height: 5\' 7"  (1.702 m)  GENERAL: The patient is a well-nourished male, in no acute distress. The vital signs are documented above. CARDIAC: There is a regular rate and rhythm.  VASCULAR: Nonpalpable pedal pulses.  Palpable femoral pulses bilaterally. PULMONARY: Non-labored respirations ABDOMEN: Soft and non-tender .  MUSCULOSKELETAL: There are no major deformities or cyanosis. NEUROLOGIC: No focal weakness or paresthesias are detected. SKIN: There are no ulcers or rashes noted. PSYCHIATRIC: The patient has a normal affect.  STUDIES:   I have reviewed the following: +-------+-----------+-----------+------------+------------+  ABI/TBIToday's ABIToday's TBIPrevious ABIPrevious TBI  +-------+-----------+-----------+------------+------------+  Right 0.92    0.70    0.96    0.90      +-------+-----------+-----------+------------+------------+  Left  0.65    0.56    0.76    0.86      +-------+-----------+-----------+------------+------------+   Velocities within the left limb of the aortic graft are now greater than 900 which is up from 500  MEDICAL ISSUES:   We discussed possibly proceeding with angiography however the patient would like to continue to monitor the left groin for another 6 months.  He will come back in 6 months for ultrasound and ABIs.  I will also get carotid studies.    Leia Alf, MD, FACS [facility] [phone] Pager [phone]

## 2020-02-25 ENCOUNTER — Other Ambulatory Visit: Payer: Self-pay | Admitting: *Deleted

## 2020-02-25 DIAGNOSIS — I739 Peripheral vascular disease, unspecified: Secondary | ICD-10-CM

## 2020-02-25 DIAGNOSIS — I6523 Occlusion and stenosis of bilateral carotid arteries: Secondary | ICD-10-CM

## 2020-02-25 DIAGNOSIS — I7409 Other arterial embolism and thrombosis of abdominal aorta: Secondary | ICD-10-CM

## 2020-09-11 ENCOUNTER — Ambulatory Visit (INDEPENDENT_AMBULATORY_CARE_PROVIDER_SITE_OTHER)
Admission: RE | Admit: 2020-09-11 | Discharge: 2020-09-11 | Disposition: A | Discharge status: Home / Self Care | Payer: Medicare Other | Source: Ambulatory Visit | Attending: Surgery | Admitting: Surgery

## 2020-09-11 ENCOUNTER — Other Ambulatory Visit: Payer: Self-pay

## 2020-09-11 ENCOUNTER — Ambulatory Visit (HOSPITAL_COMMUNITY)
Admission: RE | Admit: 2020-09-11 | Discharge: 2020-09-11 | Disposition: A | Discharge status: Home / Self Care | Payer: Medicare Other | Source: Ambulatory Visit | Attending: Surgery | Admitting: Surgery

## 2020-09-11 DIAGNOSIS — I739 Peripheral vascular disease, unspecified: Secondary | ICD-10-CM

## 2020-09-11 DIAGNOSIS — I7409 Other arterial embolism and thrombosis of abdominal aorta: Secondary | ICD-10-CM

## 2020-09-11 DIAGNOSIS — I6523 Occlusion and stenosis of bilateral carotid arteries: Secondary | ICD-10-CM | POA: Insufficient documentation

## 2020-09-14 ENCOUNTER — Ambulatory Visit (INDEPENDENT_AMBULATORY_CARE_PROVIDER_SITE_OTHER): Payer: Medicare Other

## 2020-09-14 ENCOUNTER — Other Ambulatory Visit: Payer: Self-pay

## 2020-09-14 ENCOUNTER — Encounter: Payer: Self-pay | Admitting: Surgery

## 2020-09-14 ENCOUNTER — Other Ambulatory Visit: Payer: Self-pay | Admitting: *Deleted

## 2020-09-14 ENCOUNTER — Ambulatory Visit (INDEPENDENT_AMBULATORY_CARE_PROVIDER_SITE_OTHER): Payer: Medicare Other | Admitting: Surgery

## 2020-09-14 VITALS — BP 157/80 | HR 77 | Temp 98.1°F | Resp 20 | Ht 67.0 in | Wt 151.0 lb

## 2020-09-14 DIAGNOSIS — I779 Disorder of arteries and arterioles, unspecified: Secondary | ICD-10-CM

## 2020-09-14 DIAGNOSIS — I739 Peripheral vascular disease, unspecified: Secondary | ICD-10-CM

## 2020-09-14 DIAGNOSIS — I6523 Occlusion and stenosis of bilateral carotid arteries: Secondary | ICD-10-CM

## 2020-09-14 DIAGNOSIS — I70213 Atherosclerosis of native arteries of extremities with intermittent claudication, bilateral legs: Secondary | ICD-10-CM

## 2020-09-14 MED ORDER — IOHEXOL 350 MG/ML SOLN
125.0000 mL | Freq: Once | INTRAVENOUS | Status: AC | PRN
  Administered 2020-09-14: 100 mL via INTRAVENOUS

## 2020-09-14 NOTE — Progress Notes (Signed)
Vascular and Vein Specialist of [facility]  Patient name: Tyler [name] MRN: [id number] DOB: July 27, 1929 Sex: male   REASON FOR VISIT:    Follow up  [place]:   The patient is back today for followup. He is a former patient of Dr. Amedeo Plenty. He is status post complex aortic reconstruction with an aortobifemoral bypass graft and bilateral renal artery bypass graft. He is also status post right femoral-popliteal bypass graft in 1995 and 2003. I performed a right carotid endarterectomy for asymptomatic stenosis. 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 He underwent angiography on 04/15/2014 to better define this. I found approximately 80% stenosis within the distal limb of the aortobifemoral graft. On 05/27/2014, he underwent drug coated balloon angioplasty of what appeared to be a stenosis within the limb of his aortobifemoral graft. There was a residual stenosis after angioplasty.He is back for follow up.  He states that 2 to 3 weeks ago he began having worsening claudication symptoms at approximately 100 yards.  He does not have rest pain or open wounds  He denies having any significant claudication symptoms.  He does not have rest pain or nonhealing wounds.  He is now taking Eliquis for atrial fibrillation.  He is on a statin for hypercholesterolemia.  He is medically managed for hypertension.   PAST MEDICAL HISTORY:   Past Medical History:  Diagnosis Date  . Cancer ([place])    kidney  . Carotid artery occlusion   . CHF (congestive heart failure) ([place])   . COPD (chronic obstructive pulmonary disease) ([place])   . Coronary artery disease   . DVT (deep venous thrombosis) ([place])   . Failure to thrive (0-17) 03/2019  . Hyperlipidemia   . Hypertension   . Myocardial infarction ([place]) 2009  . Peripheral vascular disease ([place])   . Ulcer      FAMILY HISTORY:   Family History  Problem Relation Age of Onset  .  Heart disease Father        Aneurysm, Abdominal Aortic  . Hypertension Other   . Coronary artery disease Other   . Alzheimer's disease Other   . Other Other        AAA  . Arthritis Other   . Diabetes Other   . Seizures Other     SOCIAL HISTORY:   Social History   Tobacco Use  . Smoking status: Former Smoker    Packs/day: 1.00    Years: 40.00    Pack years: 40.00    Types: Cigarettes    Quit date: 10/11/1999    Years since quitting: 20.9  . Smokeless tobacco: Never Used  Substance Use Topics  . Alcohol use: No    Alcohol/week: 0.0 standard drinks     ALLERGIES:   Allergies  Allergen Reactions  . Adhesive [Tape] Other (See Comments)    TEARS AND BRUISES SKIN- PLEASE USE AN ALTERNATIVE LIKE COBAN WRAP!!  . Buspirone Hives, Itching, Rash and Hypertension  . Penicillins Swelling and Rash    Has patient had a PCN reaction causing immediate rash, facial/tongue/throat swelling, SOB or lightheadedness with hypotension: Yes Has patient had a PCN reaction causing severe rash involving mucus membranes or skin necrosis: No Has patient had a PCN reaction that required hospitalization No Has patient had a PCN reaction occurring within the last 10 years: No If all of the above answers are "NO", then may proceed with Cephalosporin use.      CURRENT MEDICATIONS:   Current  Outpatient Medications  Medication Sig Dispense Refill  . amiodarone (PACERONE) 200 MG tablet Take 100 mg by mouth every other day.     Marland Kitchen apixaban (ELIQUIS) 2.5 MG TABS tablet Take by mouth.    Marland Kitchen aspirin EC 81 MG tablet Take 81 mg by mouth daily.      . carboxymethylcellulose (REFRESH TEARS) 0.5 % SOLN Place 1-2 drops into both eyes See admin instructions. Instill 1-2 drops into each eye 2 times a day (morning and bedtime) and an additional 1-2 drops into each eye daily as needed for dryness    . carvedilol (COREG) 12.5 MG tablet Take 6.25 mg by mouth 2 (two) times daily with a meal.    .  clotrimazole-betamethasone (LOTRISONE) cream Apply topically 2 (two) times daily. 30 g 0  . dicyclomine (BENTYL) 10 MG capsule Take 20 mg by mouth 2 (two) times a day.     . isosorbide mononitrate (IMDUR) 30 MG 24 hr tablet Take by mouth.    . levothyroxine (SYNTHROID) 75 MCG tablet Take 75 mcg by mouth daily before breakfast.    . Multiple Vitamins-Minerals (ONE-A-DAY MENS 50+ ADVANTAGE) TABS Take 1 tablet by mouth daily with breakfast.    . nystatin cream (MYCOSTATIN) Apply 1 application topically See admin instructions. Apply to penis three times a day    . omeprazole (PRILOSEC) 20 MG capsule Take 20 mg by mouth at bedtime.     . rosuvastatin (CRESTOR) 20 MG tablet Take by mouth.    . tamsulosin (FLOMAX) 0.4 MG CAPS capsule Take 1 capsule (0.4 mg total) by mouth daily after supper. 30 capsule 0   No current facility-administered medications for this visit.    REVIEW OF SYSTEMS:   [X]  denotes positive finding, [ ]  denotes negative finding Cardiac  Comments:  Chest pain or chest pressure:    Shortness of breath upon exertion:    Short of breath when lying flat:    Irregular heart rhythm:        Vascular    Pain in calf, thigh, or hip brought on by ambulation: x   Pain in feet at night that wakes you up from your sleep:     Blood clot in your veins:    Leg swelling:         Pulmonary    Oxygen at home:    Productive cough:     Wheezing:         Neurologic    Sudden weakness in arms or legs:     Sudden numbness in arms or legs:     Sudden onset of difficulty speaking or slurred speech:    Temporary loss of vision in one eye:     Problems with dizziness:         Gastrointestinal    Blood in stool:     Vomited blood:         Genitourinary    Burning when urinating:     Blood in urine:        Psychiatric    Major depression:         Hematologic    Bleeding problems:    Problems with blood clotting too easily:        Skin    Rashes or ulcers:        Constitutional     Fever or chills:      PHYSICAL EXAM:   There were no vitals filed for this visit.  GENERAL: The patient is a  well-nourished male, in no acute distress. The vital signs are documented above. CARDIAC: There is a regular rate and rhythm.  VASCULAR: Palpable right femoral pulse, nonpalpable left PULMONARY: Non-labored respirations ABDOMEN: Soft and non-tender with normal pitched bowel sounds.  MUSCULOSKELETAL: There are no major deformities or cyanosis. NEUROLOGIC: No focal weakness or paresthesias are detected. SKIN: There are no ulcers or rashes noted. PSYCHIATRIC: The patient has a normal affect.  STUDIES:   I have reviewed the following :  +-------+-----------+-----------+------------+------------+  ABI/TBIToday's ABIToday's TBIPrevious ABIPrevious TBI  +-------+-----------+-----------+------------+------------+  Right 0.91    0.69    0.92    0.70      +-------+-----------+-----------+------------+------------+  Left  0.63    0.63    0.65    0.56      +-------+-----------+-----------+------------+------------+   Carotid: Right Carotid: Patent carotid endarterectomy site with velocity in the  right ICA         consistent with a 1-39% stenosis.   Left Carotid: Velocities in the left ICA are consistent with a 40-59%  stenosis.        Calcific plaque may obscure higher velocity.   MEDICAL ISSUES:   The patient has developed worsening claudication on the left.  I am unable to feel a graft pulse in the left which is concerning for possible occlusion of the left limb of his aortobifemoral graft.  I have recommended getting a CT scan to determine whether or not the limb has occluded or is just progressed with his stenosis.  If it has progressed, we will consider angiography.  If it has occluded, he is not interested in surgery.  I will schedule him for virtual visit after his CT scan has been performed.    Leia Alf, MD, FACS Vascular and Vein Specialists of [facility] [phone] Pager [phone]

## 2020-09-23 ENCOUNTER — Encounter: Payer: Self-pay | Admitting: Surgery

## 2020-09-23 ENCOUNTER — Other Ambulatory Visit: Payer: Self-pay

## 2020-09-23 ENCOUNTER — Ambulatory Visit (INDEPENDENT_AMBULATORY_CARE_PROVIDER_SITE_OTHER): Payer: Medicare Other | Admitting: Surgery

## 2020-09-23 DIAGNOSIS — I70213 Atherosclerosis of native arteries of extremities with intermittent claudication, bilateral legs: Secondary | ICD-10-CM | POA: Diagnosis not present

## 2020-09-23 NOTE — Progress Notes (Signed)
Vascular and Vein Specialist of [place]  Patient name: Tyler [name] MRN: [id number] DOB: October 21, 1928 Sex: male      Virtual Visit via Telephone Note   This visit type was conducted due to national recommendations for restrictions regarding the COVID-19 Pandemic (e.g. social distancing) in an effort to limit this patient's exposure and mitigate transmission in our community.  Due to his co-morbid illnesses, this patient is at least at moderate risk for complications without adequate follow up.  This format is felt to be most appropriate for this patient at this time.  The patient did not have access to video technology/had technical difficulties with video requiring transitioning to audio format only (telephone).  All issues noted in this document were discussed and addressed.  No physical exam could be performed with this format.  Please refer to the patient's chart for his  consent to telehealth for [facility].   Patient Location: Home Provider Location: Office/Clinic    REASON FOR APPOINTMENT:    CT scan result discussio  HISTORY OF PRESENT ILLNESS:   The patient is back today for followup. He is a former patient of Dr. Amedeo Plenty. He is status post complex aortic reconstruction with an aortobifemoral bypass graft and bilateral renal artery bypass graft. He is also status post right femoral-popliteal bypass graft in 1995 and 2003. I performed a right carotid endarterectomy for asymptomatic stenosis. 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 He underwent angiography on 04/15/2014 to better define this. I found approximately 80% stenosis within the distal limb of the aortobifemoral graft. On 05/27/2014, he underwent drug coated balloon angioplasty of what appeared to be a stenosis within the limb of his aortobifemoral graft. There was a residual stenosis after angioplasty. He began having worsening left leg symptoms approximately a month ago.  On exam,  I could not feel a femoral pulse on the left and so I sent him for CT scan.  Today, we are discussing the results of his CT scan.  He denies having any significant claudication symptoms. He does not have rest pain or nonhealing wounds.  He is now taking Eliquis for atrial fibrillation. He is on a statin for hypercholesterolemia. He is medically managed for hypertension.     PAST MEDICAL HISTORY    Past Medical History:  Diagnosis Date  . Cancer ([place])    kidney  . Carotid artery occlusion   . CHF (congestive heart failure) ([place])   . COPD (chronic obstructive pulmonary disease) ([place])   . Coronary artery disease   . DVT (deep venous thrombosis) ([place])   . Failure to thrive (0-17) 03/2019  . Hyperlipidemia   . Hypertension   . Myocardial infarction ([place]) 2009  . Peripheral vascular disease ([place])   . Ulcer      FAMILY HISTORY   Family History  Problem Relation Age of Onset  . Heart disease Father        Aneurysm, Abdominal Aortic  . Hypertension Other   . Coronary artery disease Other   . Alzheimer's disease Other   . Other Other        AAA  . Arthritis Other   . Diabetes Other   . Seizures Other     SOCIAL HISTORY:   Social History   Socioeconomic History  . Marital status: Married    Spouse name: Not on file  . Number of children: 3  . Years of  education: Not on file  . Highest education level: Not on file  Occupational History  . Not on file  Tobacco Use  . Smoking status: Former Smoker    Packs/day: 1.00    Years: 40.00    Pack years: 40.00    Types: Cigarettes    Quit date: 10/11/1999    Years since quitting: 20.9  . Smokeless tobacco: Never Used  Vaping Use  . Vaping Use: Never used  Substance and Sexual Activity  . Alcohol use: No    Alcohol/week: 0.0 standard drinks  . Drug use: No  . Sexual activity: Not on file  Other Topics Concern  . Not on file  Social History Narrative  . Not on file   Social Determinants of Health   Financial  Resource Strain: Not on file  Food Insecurity: Not on file  Transportation Needs: Not on file  Physical Activity: Not on file  Stress: Not on file  Social Connections: Not on file  Intimate Partner Violence: Not on file    ALLERGIES:    Allergies  Allergen Reactions  . Adhesive [Tape] Other (See Comments)    TEARS AND BRUISES SKIN- PLEASE USE AN ALTERNATIVE LIKE COBAN WRAP!!  . Buspirone Hives, Itching, Rash and Hypertension  . Penicillins Swelling and Rash    Has patient had a PCN reaction causing immediate rash, facial/tongue/throat swelling, SOB or lightheadedness with hypotension: Yes Has patient had a PCN reaction causing severe rash involving mucus membranes or skin necrosis: No Has patient had a PCN reaction that required hospitalization No Has patient had a PCN reaction occurring within the last 10 years: No If all of the above answers are "NO", then may proceed with Cephalosporin use.     CURRENT MEDICATIONS:    Current Outpatient Medications  Medication Sig Dispense Refill  . amiodarone (PACERONE) 200 MG tablet Take 100 mg by mouth every other day.     Marland Kitchen apixaban (ELIQUIS) 2.5 MG TABS tablet Take by mouth.    Marland Kitchen aspirin EC 81 MG tablet Take 81 mg by mouth daily.      . carboxymethylcellulose (REFRESH TEARS) 0.5 % SOLN Place 1-2 drops into both eyes See admin instructions. Instill 1-2 drops into each eye 2 times a day (morning and bedtime) and an additional 1-2 drops into each eye daily as needed for dryness    . carvedilol (COREG) 12.5 MG tablet Take 6.25 mg by mouth 2 (two) times daily with a meal. (Patient not taking: Reported on 09/14/2020)    . clotrimazole-betamethasone (LOTRISONE) cream Apply topically 2 (two) times daily. 30 g 0  . dicyclomine (BENTYL) 10 MG capsule Take 20 mg by mouth 2 (two) times a day.     . isosorbide mononitrate (IMDUR) 30 MG 24 hr tablet Take by mouth.    . levothyroxine (SYNTHROID) 75 MCG tablet Take 75 mcg by mouth daily before  breakfast.    . Multiple Vitamins-Minerals (ONE-A-DAY MENS 50+ ADVANTAGE) TABS Take 1 tablet by mouth daily with breakfast.    . nystatin cream (MYCOSTATIN) Apply 1 application topically See admin instructions. Apply to penis three times a day    . omeprazole (PRILOSEC) 20 MG capsule Take 20 mg by mouth at bedtime.     . rosuvastatin (CRESTOR) 20 MG tablet Take by mouth.    . tamsulosin (FLOMAX) 0.4 MG CAPS capsule Take 1 capsule (0.4 mg total) by mouth daily after supper. 30 capsule 0   No current facility-administered medications for this visit.  REVIEW OF SYSTEMS:   Please see the history of present illness.     All other systems reviewed and are negative.  PHYSICAL EXAM:   No exam due to virtual nature of visit   Recent Labs: No results found for requested labs within last 8760 hours.   Recent Lipid Panel No results found for: CHOL, TRIG, HDL, CHOLHDL, LDLCALC, LDLDIRECT  Wt Readings from Last 3 Encounters:  09/14/20 151 lb (68.5 kg)  02/24/20 153 lb (69.4 kg)  03/18/19 139 lb (63 kg)     STUDIES:   I have reviewed the CT with the following results: 1. Surgical changes of prior aortobifemoral bypass graft with surgical reimplantation of the renal arteries. 2. The left aortofemoral limb is completely occluded. This appears to be chronic in nature given the relative hypertrophy of the median sacral artery and inferior epigastric arteries which provide collateral blood flow to the left lower extremity. 3. 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 4. Moderate stenosis of the celiac axis secondary to a combination of atherosclerotic plaque and median arcuate ligament compression. 5. Widely patent right femoral to above the knee popliteal bypass graft without evidence of complication. 6. 2 vessel runoff to the right ankle. The posterior tibial artery occludes in the proximal calf. 7. Extensive atherosclerotic disease throughout the  native left superficial femoral and popliteal arteries with multifocal areas of heavily calcified stenosis. 8. Patent 3 vessel runoff to the left ankle.  NON-VASCULAR  1. No acute abnormality within the abdomen or pelvis. 2. Suspect bladder outlet obstruction resulting in trabeculation of the bladder wall and formation of multiple small bladder diverticula. 3. Colonic diverticular disease without CT evidence of active inflammation. 4. Small omental fat containing mid epigastric ventral abdominal wall hernia. ASSESSMENT and PLAN   I spoke with the patient and his daughter via telephone.  We discussed the results of the CT scan that shows interval occlusion of the left limb of his aortic graft.  He does not have a open wound.  He does not have rest pain.  He is walking approximately 100 to 200 feet before he has to stop due to discomfort in his leg.  I told him that we had 2 options.  The first would be continue observation, understanding that if he developed a wound or rest pain that he would require surgery, possibly including a femoral-popliteal bypass graft or stenting, in addition to a femorofemoral.  The second option would be to proceed with a right to left femoral-femoral bypass graft.  The patient was very clear that he thinks he can live with his current level of disability and would like to delay surgery, since he is not sure he will tolerate this well.  They understand that if he gets a wound or change in symptoms, to contact me.  Otherwise I will see him back in 3 months     Time:   Today, I have spent 27  minutes with the patient with telehealth technology discussing the above problems.    Disposition:  Follow up  3 months    Leia Alf, MD, FACS [facility]ascular and Vein Specialists of [facility] [phone] Pager [phone]

## 2020-11-08 IMAGING — CT CT ABDOMEN AND PELVIS WITHOUT CONTRAST
2 of 4 series · 13 of 46 positions shown, 15 images · non-contrast
Comparison: None available.

CLINICAL DATA: 89-year-old male with unintended weight loss.
Chronic history of peripheral vascular disease, previous aortobifem
and fem-pop grafts.

EXAM:
CT CHEST, ABDOMEN AND PELVIS WITHOUT CONTRAST
TECHNIQUE: Multidetector CT imaging of the chest, abdomen and pelvis was
performed following the standard protocol without IV contrast.

[Series 3: cap wo 5.0 i31f 2 · axial · 0.77mm/px · z∈[+965,+1525]mm · 10 of 132 slices shown, 12 images]
[im 10/132  soft-tissue]
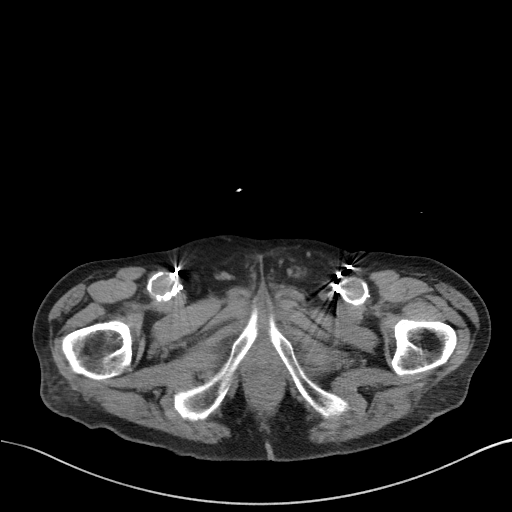
[im 10/132  bone]
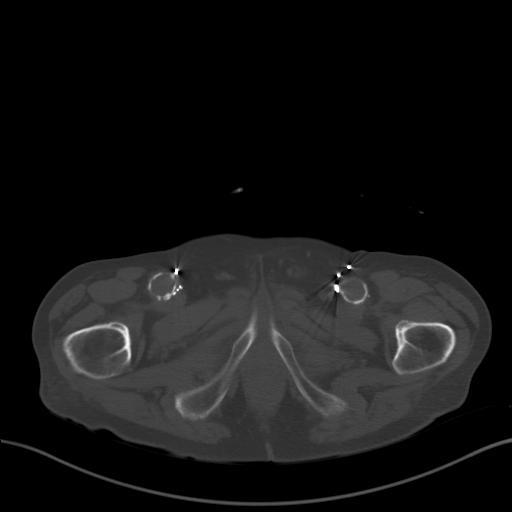
[im 19/132  soft-tissue]
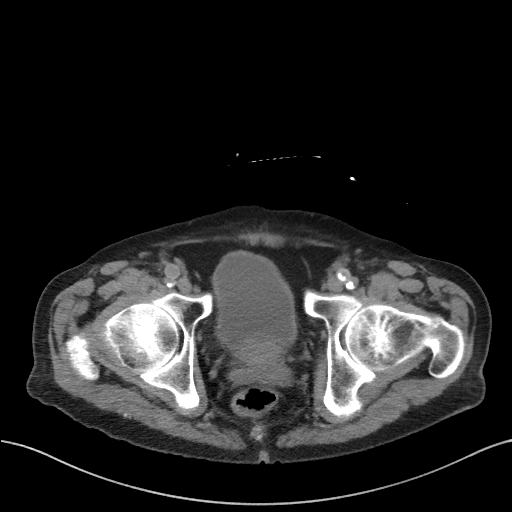
[im 38/132  soft-tissue]
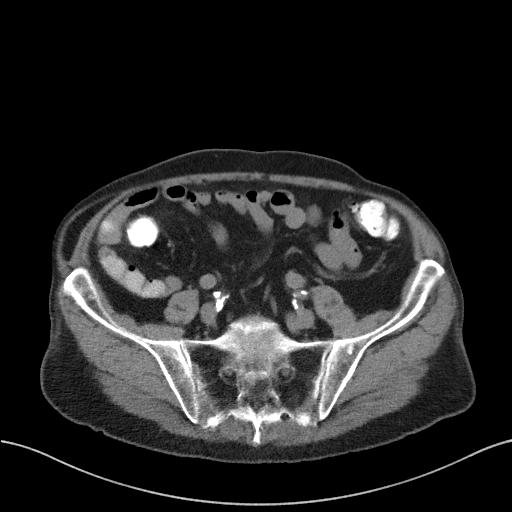
[im 47/132  soft-tissue]
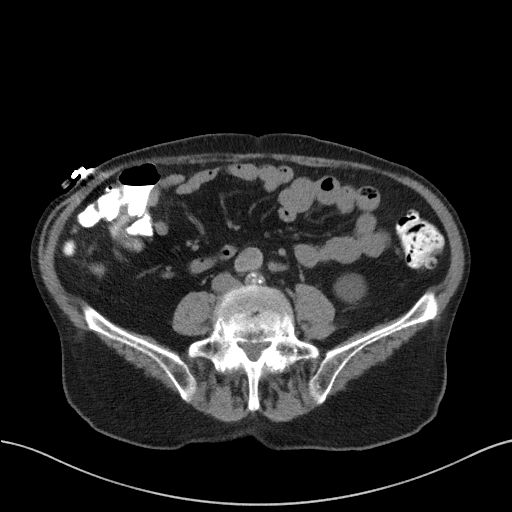
[im 57/132  soft-tissue]
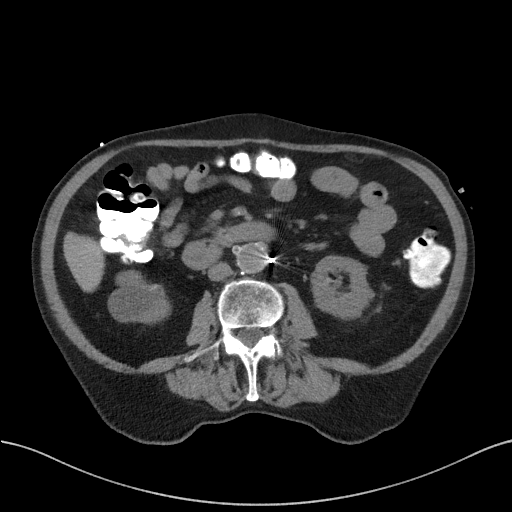
[im 75/132  soft-tissue]
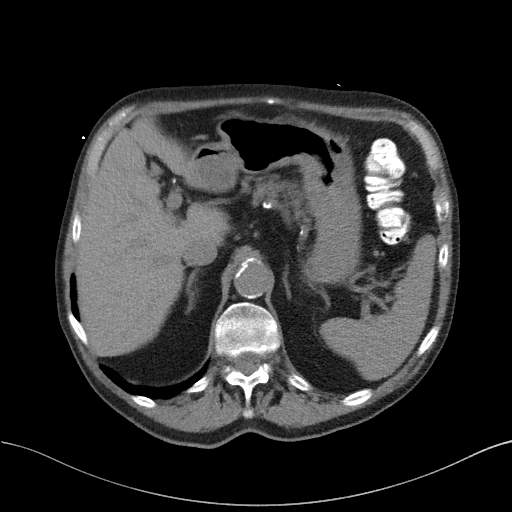
[im 85/132  soft-tissue]
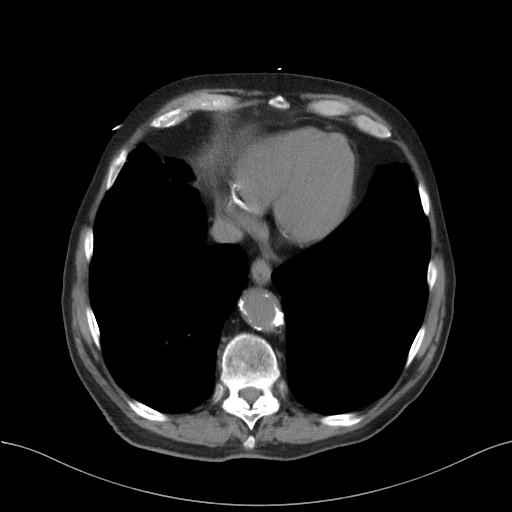
[im 94/132  soft-tissue]
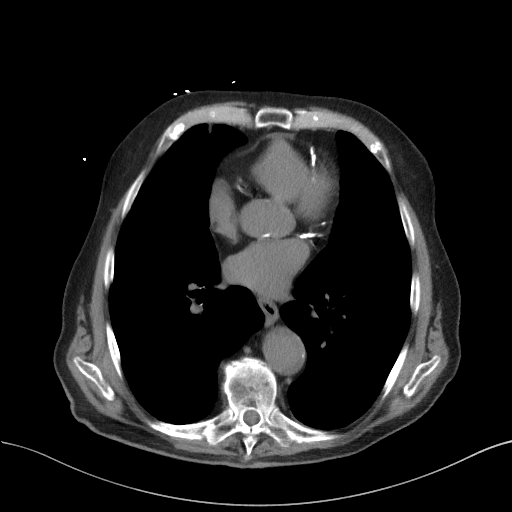
[im 113/132  soft-tissue]
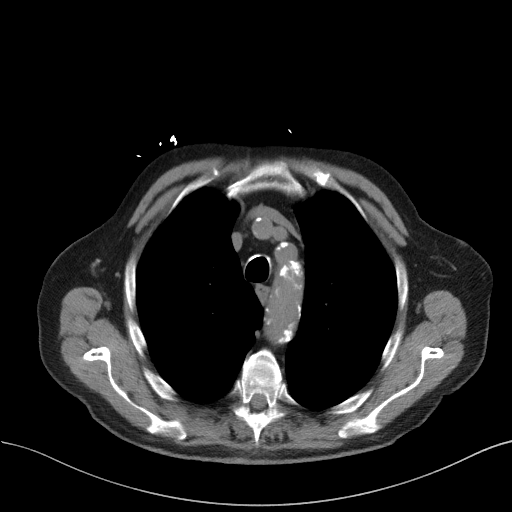
[im 113/132  bone]
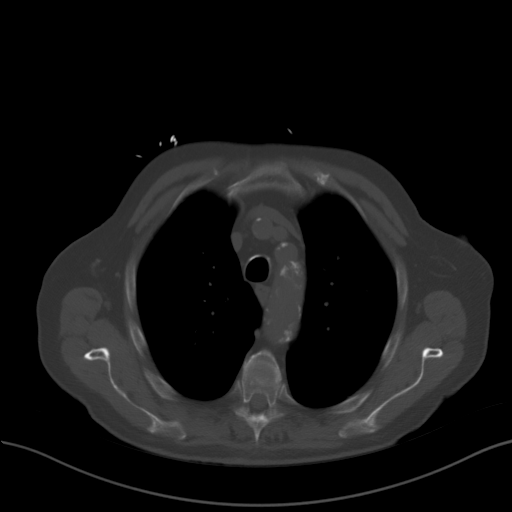
[im 122/132  soft-tissue]
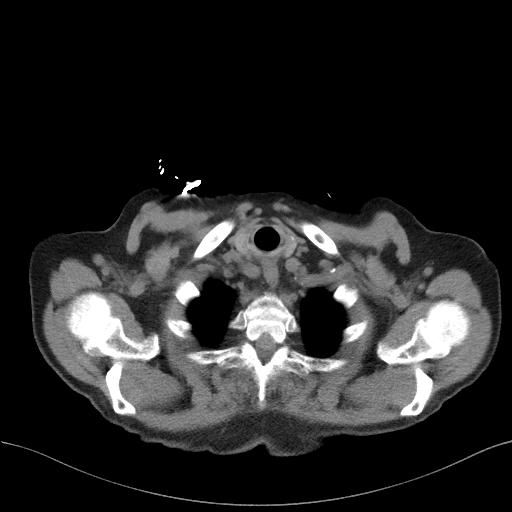

[Series 6: coronal · coronal · 0.74mm/px · 3 of 159 slices shown]
[im 53/159  soft-tissue]
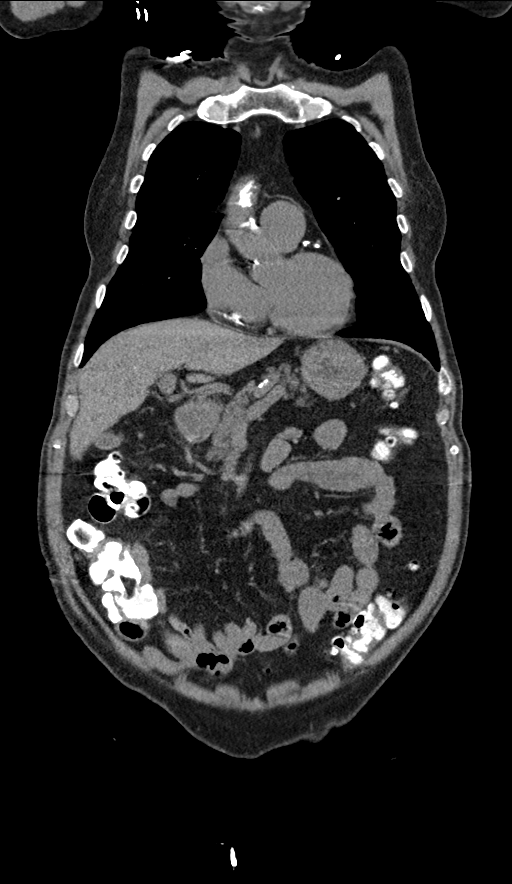
[im 71/159  soft-tissue]
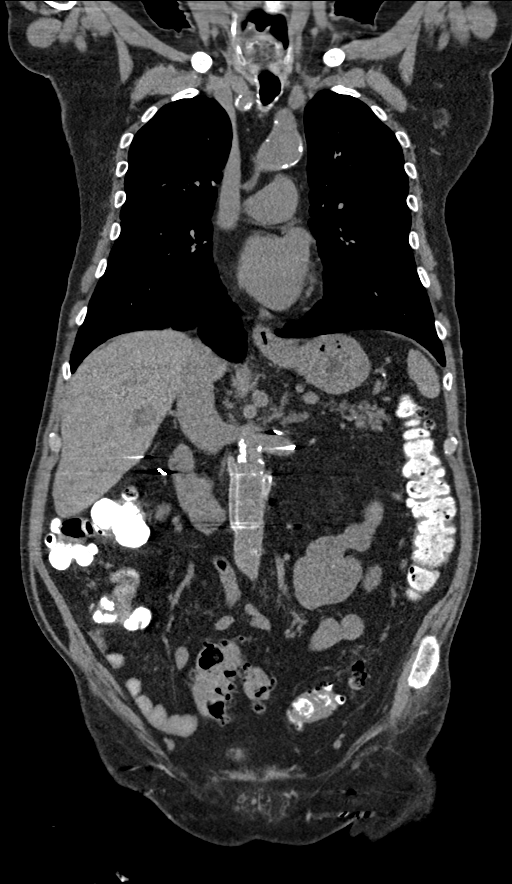
[im 88/159  soft-tissue]
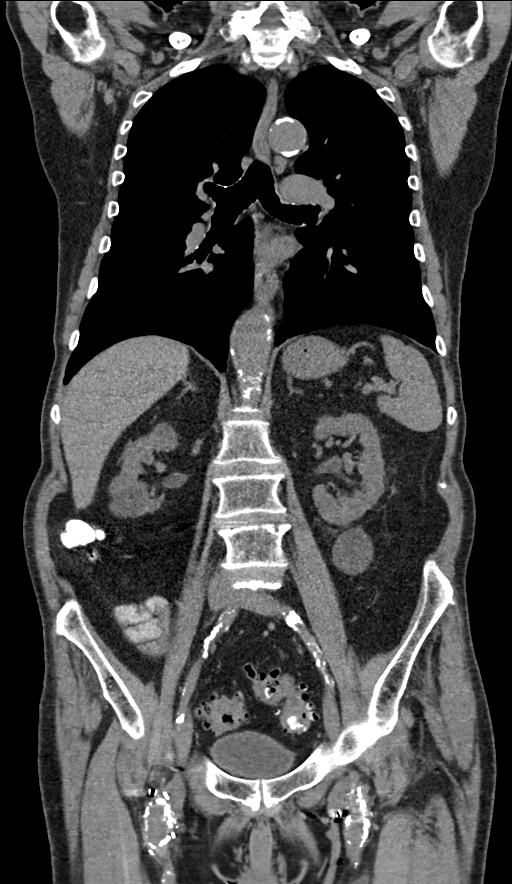

[13 of 46 positions shown; findings below may reference images not displayed]

FINDINGS: CT CHEST FINDINGS

Cardiovascular: Vascular patency is not evaluated in the absence of
IV contrast. Calcified aortic atherosclerosis. On series 3, image 37
extensive calcified coronary artery atherosclerosis. No cardiomegaly
or pericardial effusion.

Mediastinum/Nodes: Negative, no lymphadenopathy.

Lungs/Pleura: Large lung volumes. Mild scarring and subtle
bronchiectasis in the bilateral middle lobes (such as series 4,
image 111) and mild subpleural scarring in the anterior upper lobes
but no overt emphysema. Major airways are patent. No pleural
effusion or suspicious pulmonary opacity.

Musculoskeletal: Circumscribed round 12 millimeters sclerotic focus
in the C7 vertebral body (series 4, image 19). Superimposed lower
cervical spine degeneration. There is a tiny sclerotic focus also in
the left T4 transverse process on series 4, image 34, but otherwise
normal bone mineralization in the thorax. Exaggerated thoracic
kyphosis. Thoracic disc degeneration with vacuum phenomena. No
fracture.

There is a 22 millimeter hyperdense dermal lesion along the anterior
left shoulder on series 3, image 18. There is a tiny calcified right
axillary lymph node. No axillary lymphadenopathy.

CT ABDOMEN PELVIS FINDINGS

Hepatobiliary: Contracted, diminutive gallbladder. Negative
noncontrast liver. There are small surgical clips along the right
inferior liver margin.

Pancreas: Partially atrophied, otherwise negative.

Spleen: Negative.

Adrenals/Urinary Tract: Normal adrenal glands.

Multiple bilateral low-density renal lesions, the largest of which
have simple fluid density except for an exophytic 30 millimeter
lesion at the right midpole anteriorly which measures 26-27
Hounsfield units. No nephrolithiasis or hydronephrosis. No
hydroureter. Negative course of both ureters.

There are small bilateral bladder diverticula associated with
generalized bladder wall thickening (series 3, image 112). No
perivesical stranding.

Stomach/Bowel: Negative rectum. Severe sigmoid diverticulosis.
Diverticulosis continues throughout the descending and into the
transverse colon. There is also diverticulosis of the right colon.
No active inflammation identified. Normal appendix. There is oral
contrast in the large bowel to the proximal sigmoid. Negative
terminal ileum. No dilated small bowel. Stomach and duodenum appear
negative. No free air or free fluid.

Vascular/Lymphatic: Sequelae of abdominal aortic bypass, with grafts
to the renal arteries and bypass of the bilateral iliac arteries.
Calcified bilateral anastomoses at the femoral arteries. Native
Aortoiliac calcified atherosclerosis. Vascular patency is not
evaluated in the absence of IV contrast.

No lymphadenopathy.

Reproductive: Negative aside from prostate enlargement.

Other: No pelvic free fluid.

Musculoskeletal: Normal lumbar segmentation. Tiny sclerotic focus in
the posterior right L2 vertebral body on series 3, image 69, and
circumscribed 10 millimeter sclerotic focus in the medial right
iliac bone on image 103. But otherwise normal bone mineralization in
the lumbar spine and pelvis. No acute osseous abnormality
identified.
IMPRESSION: 1. There is an indeterminate, intermediate density 3 cm exophytic
right renal lesion which could be a small solid mass or a
proteinaceous cyst.
But there is no evidence of active malignancy in the non-contrast
chest, abdomen, or pelvis.
Occasional small sclerotic bone lesions are most likely benign bone
islands.
2. Aortic Atherosclerosis (Y748G-0QR.R) with aortic, iliac and
femoral bypass. Calcified coronary artery atherosclerosis. Vascular
patency is not evaluated in the absence of IV contrast.
3. Mild lung scarring and bronchiectasis. Extensive diverticulosis
of the large bowel.
4. Bladder diverticula.

## 2020-12-28 ENCOUNTER — Encounter: Payer: Self-pay | Admitting: Surgery

## 2020-12-28 ENCOUNTER — Ambulatory Visit (INDEPENDENT_AMBULATORY_CARE_PROVIDER_SITE_OTHER): Payer: Medicare Other | Admitting: Surgery

## 2020-12-28 ENCOUNTER — Other Ambulatory Visit: Payer: Self-pay

## 2020-12-28 VITALS — BP 129/72 | HR 82 | Temp 98.1°F | Resp 20 | Ht 67.0 in | Wt 161.0 lb

## 2020-12-28 DIAGNOSIS — I6523 Occlusion and stenosis of bilateral carotid arteries: Secondary | ICD-10-CM

## 2020-12-28 DIAGNOSIS — I70213 Atherosclerosis of native arteries of extremities with intermittent claudication, bilateral legs: Secondary | ICD-10-CM | POA: Diagnosis not present

## 2020-12-28 NOTE — Progress Notes (Signed)
Vascular and Vein Specialist of [facility]  Patient name: Tyler [name] MRN: [id number] DOB: 21-Dec-1928 Sex: male   REASON FOR VISIT:    Follow up  [place]:   The patient is back today for followup. He is a former patient of Dr. Amedeo Plenty. He is status post complex aortic reconstruction with an aortobifemoral bypass graft and bilateral renal artery bypass graft. He is also status post right femoral-popliteal bypass graft in 1995 and 2003. I performed a right carotid endarterectomy for asymptomatic stenosis. 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 He underwent angiography on 04/15/2014 to better define this. I found approximately 80% stenosis within the distal limb of the aortobifemoral graft. On 05/27/2014, he underwent drug coated balloon angioplasty of what appeared to be a stenosis within the limb of his aortobifemoral graft. There was a residual stenosis after angioplasty. He began having worsening left leg symptoms approximately a month ago.  On exam, I could not feel a femoral pulse on the left and so I sent him for CT scan.  Today, we are discussing the results of his CT scan.  He denies having any significant claudication symptoms. He does not have rest pain or nonhealing wounds.  Unfortunately, he fell and suffered a sternal fracture.  He is now taking Eliquis for atrial fibrillation. He is on a statin for hypercholesterolemia. He is medically managed for hypertension.   PAST MEDICAL HISTORY:   Past Medical History:  Diagnosis Date  . Cancer ([place])    kidney  . Carotid artery occlusion   . CHF (congestive heart failure) ([place])   . COPD (chronic obstructive pulmonary disease) ([place])   . Coronary artery disease   . DVT (deep venous thrombosis) ([place])   . Failure to thrive (0-17) 03/2019  . Hyperlipidemia   . Hypertension   . Myocardial infarction ([place]) 2009  . Peripheral vascular disease ([place])    . Ulcer      FAMILY HISTORY:   Family History  Problem Relation Age of Onset  . Heart disease Father        Aneurysm, Abdominal Aortic  . Hypertension Other   . Coronary artery disease Other   . Alzheimer's disease Other   . Other Other        AAA  . Arthritis Other   . Diabetes Other   . Seizures Other     SOCIAL HISTORY:   Social History   Tobacco Use  . Smoking status: Former Smoker    Packs/day: 1.00    Years: 40.00    Pack years: 40.00    Types: Cigarettes    Quit date: 10/11/1999    Years since quitting: 21.2  . Smokeless tobacco: Never Used  Substance Use Topics  . Alcohol use: No    Alcohol/week: 0.0 standard drinks     ALLERGIES:   Allergies  Allergen Reactions  . Adhesive [Tape] Other (See Comments)    TEARS AND BRUISES SKIN- PLEASE USE AN ALTERNATIVE LIKE COBAN WRAP!!  . Buspirone Hives, Itching, Rash and Hypertension  . Penicillins Swelling and Rash    Has patient had a PCN reaction causing immediate rash, facial/tongue/throat swelling, SOB or lightheadedness with hypotension: Yes Has patient had a PCN reaction causing severe rash involving mucus membranes or skin necrosis: No Has patient had a PCN reaction that required hospitalization No Has patient had a PCN reaction occurring within the last 10 years: No If all of the above answers are "NO", then  may proceed with Cephalosporin use.      CURRENT MEDICATIONS:   Current Outpatient Medications  Medication Sig Dispense Refill  . amiodarone (PACERONE) 200 MG tablet Take 100 mg by mouth every other day.     Marland Kitchen apixaban (ELIQUIS) 2.5 MG TABS tablet Take by mouth.    Marland Kitchen aspirin EC 81 MG tablet Take 81 mg by mouth daily.    . carboxymethylcellulose (REFRESH PLUS) 0.5 % SOLN Place 1-2 drops into both eyes See admin instructions. Instill 1-2 drops into each eye 2 times a day (morning and bedtime) and an additional 1-2 drops into each eye daily as needed for dryness    . carvedilol (COREG) 12.5 MG  tablet Take 6.25 mg by mouth 2 (two) times daily with a meal.    . clotrimazole-betamethasone (LOTRISONE) cream Apply topically 2 (two) times daily. 30 g 0  . dicyclomine (BENTYL) 10 MG capsule Take 20 mg by mouth 2 (two) times a day.     . finasteride (PROSCAR) 5 MG tablet TAKE ONE TABLET BY MOUTH AT BEDTIME PER CIV UROLOGY. FALLS RISKS DICUSSED WITH VET. HOLD IMDUR AT THIS TIME.    Marland Kitchen HYDROcodone-acetaminophen (NORCO/VICODIN) 5-325 MG tablet Take by mouth.    . isosorbide mononitrate (IMDUR) 30 MG 24 hr tablet Take by mouth.    . levothyroxine (SYNTHROID) 75 MCG tablet Take 75 mcg by mouth daily before breakfast.    . nystatin cream (MYCOSTATIN) Apply 1 application topically See admin instructions. Apply to penis three times a day    . omeprazole (PRILOSEC) 20 MG capsule Take 20 mg by mouth at bedtime.    . rosuvastatin (CRESTOR) 20 MG tablet Take by mouth.    . tamsulosin (FLOMAX) 0.4 MG CAPS capsule Take 1 capsule (0.4 mg total) by mouth daily after supper. 30 capsule 0   No current facility-administered medications for this visit.    REVIEW OF SYSTEMS:   [X]  denotes positive finding, [ ]  denotes negative finding Cardiac  Comments:  Chest pain or chest pressure:    Shortness of breath upon exertion:    Short of breath when lying flat:    Irregular heart rhythm:        Vascular    Pain in calf, thigh, or hip brought on by ambulation:    Pain in feet at night that wakes you up from your sleep:     Blood clot in your veins:    Leg swelling:         Pulmonary    Oxygen at home:    Productive cough:     Wheezing:         Neurologic    Sudden weakness in arms or legs:     Sudden numbness in arms or legs:     Sudden onset of difficulty speaking or slurred speech:    Temporary loss of vision in one eye:     Problems with dizziness:         Gastrointestinal    Blood in stool:     Vomited blood:         Genitourinary    Burning when urinating:     Blood in urine:         Psychiatric    Major depression:         Hematologic    Bleeding problems:    Problems with blood clotting too easily:        Skin    Rashes or ulcers:  Constitutional    Fever or chills:      PHYSICAL EXAM:   Vitals:   12/28/20 1403  BP: 129/72  Pulse: 82  Resp: 20  Temp: 98.1 F (36.7 C)  SpO2: 95%  Weight: 161 lb (73 kg)  Height: 5\' 7"  (1.702 m)    GENERAL: The patient is a well-nourished male, in no acute distress. The vital signs are documented above. CARDIAC: There is a regular rate and rhythm.  VASCULAR: Nonpalpable pedal pulses PULMONARY: Non-labored respirations MUSCULOSKELETAL: There are no major deformities or cyanosis. NEUROLOGIC: No focal weakness or paresthesias are detected. SKIN: There are no ulcers or rashes noted. PSYCHIATRIC: The patient has a normal affect.  STUDIES:   None  MEDICAL ISSUES:   The patient has short distance claudication of the left leg secondary to occlusion of the left limb of his aortobifemoral graft.  His limitations are tolerable at this time.  He does not have any open wounds or rest pain.  He is not interested in surgical intervention at this time.  He knows to contact me should he develop any change in symptoms  The patient will come back in 6 months for carotid duplex    Annamarie Major, IV, MD, FACS Vascular and Vein Specialists of [facility] [phone] Pager [phone]

## 2020-12-30 ENCOUNTER — Other Ambulatory Visit: Payer: Self-pay

## 2020-12-30 DIAGNOSIS — I70213 Atherosclerosis of native arteries of extremities with intermittent claudication, bilateral legs: Secondary | ICD-10-CM

## 2020-12-30 DIAGNOSIS — I6523 Occlusion and stenosis of bilateral carotid arteries: Secondary | ICD-10-CM

## 2021-06-28 ENCOUNTER — Ambulatory Visit (INDEPENDENT_AMBULATORY_CARE_PROVIDER_SITE_OTHER): Payer: Medicare Other | Admitting: Surgery

## 2021-06-28 ENCOUNTER — Other Ambulatory Visit: Payer: Self-pay

## 2021-06-28 ENCOUNTER — Ambulatory Visit (HOSPITAL_COMMUNITY)
Admission: RE | Admit: 2021-06-28 | Discharge: 2021-06-28 | Disposition: A | Discharge status: Home / Self Care | Payer: Medicare Other | Source: Ambulatory Visit | Attending: Surgery | Admitting: Surgery

## 2021-06-28 ENCOUNTER — Encounter: Payer: Self-pay | Admitting: Surgery

## 2021-06-28 VITALS — BP 180/78 | HR 74 | Temp 98.1°F | Resp 20 | Ht 67.0 in | Wt 158.0 lb

## 2021-06-28 DIAGNOSIS — I6523 Occlusion and stenosis of bilateral carotid arteries: Secondary | ICD-10-CM

## 2021-06-28 DIAGNOSIS — I70213 Atherosclerosis of native arteries of extremities with intermittent claudication, bilateral legs: Secondary | ICD-10-CM | POA: Diagnosis not present

## 2021-06-28 NOTE — Progress Notes (Signed)
Vascular and Vein Specialist of [facility]  Patient name: Tyler [name] MRN: [id number] DOB: 03-May-1929 Sex: male   REASON FOR VISIT:    Follow up  [place]:    The patient is back today for followup. He is a former patient of Dr. Amedeo Plenty. He is status post complex aortic reconstruction with an aortobifemoral bypass graft and bilateral renal artery bypass graft. He is also status post right femoral-popliteal bypass graft in 1995 and 2003. I performed a right carotid endarterectomy for asymptomatic stenosis. 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 He underwent angiography on 04/15/2014 to better define this. I found approximately 80% stenosis within the distal limb of the aortobifemoral graft. On 05/27/2014, he underwent drug coated balloon angioplasty of what appeared to be a stenosis within the limb of his aortobifemoral graft. There was a residual stenosis after angioplasty.  He began having worsening left leg symptoms, and so I sent him for CT scan which confirmed occlusion of the left limb of his graft.  He states that his limitations are tolerable, and was not interested in surgical intervention but did want to have this followed.   His leg will bother him with activity however with resting the discomfort goes away.  He does not have any open wounds   He is now taking Eliquis for atrial fibrillation.  He is on a statin for hypercholesterolemia.  He is medically managed for hypertension.  PAST MEDICAL HISTORY:   Past Medical History:  Diagnosis Date   Cancer [facility])    kidney   Carotid artery occlusion    CHF (congestive heart failure) (H[facility])    COPD (chronic obstructive pulmonary disease) (H[facility])    Coronary artery disease    DVT (deep venous thrombosis) ([facility])    Failure to thrive (0-17) 03/2019   Hyperlipidemia    Hypertension    Myocardial infarction [facility]) 2009   Peripheral vascular disease ([place])     Ulcer      FAMILY HISTORY:   Family History  Problem Relation Age of Onset   Heart disease Father        Aneurysm, Abdominal Aortic   Hypertension Other    Coronary artery disease Other    Alzheimer's disease Other    Other Other        AAA   Arthritis Other    Diabetes Other    Seizures Other     SOCIAL HISTORY:   Social History   Tobacco Use   Smoking status: Former    Packs/day: 1.00    Years: 40.00    Pack years: 40.00    Types: Cigarettes    Quit date: 10/11/1999    Years since quitting: 21.7   Smokeless tobacco: Never  Substance Use Topics   Alcohol use: No    Alcohol/week: 0.0 standard drinks     ALLERGIES:   Allergies  Allergen Reactions   Adhesive [Tape] Other (See Comments)    TEARS AND BRUISES SKIN- PLEASE USE AN ALTERNATIVE LIKE COBAN WRAP!!   Buspirone Hives, Itching, Rash and Hypertension   Penicillins Swelling and Rash    Has patient had a PCN reaction causing immediate rash, facial/tongue/throat swelling, SOB or lightheadedness with hypotension: Yes Has patient had a PCN reaction causing severe rash involving mucus membranes or skin necrosis: No Has patient had a PCN reaction that required hospitalization No Has patient had a PCN reaction occurring within the last 10 years: No If all of the above  answers are "NO", then may proceed with Cephalosporin use.      CURRENT MEDICATIONS:   Current Outpatient Medications  Medication Sig Dispense Refill   amiodarone (PACERONE) 200 MG tablet Take 100 mg by mouth every other day.      apixaban (ELIQUIS) 2.5 MG TABS tablet Take by mouth.     aspirin EC 81 MG tablet Take 81 mg by mouth daily.     carboxymethylcellulose (REFRESH PLUS) 0.5 % SOLN Place 1-2 drops into both eyes See admin instructions. Instill 1-2 drops into each eye 2 times a day (morning and bedtime) and an additional 1-2 drops into each eye daily as needed for dryness     carvedilol (COREG) 12.5 MG tablet Take 6.25 mg by mouth 2 (two)  times daily with a meal.     clotrimazole-betamethasone (LOTRISONE) cream Apply topically 2 (two) times daily. 30 g 0   dicyclomine (BENTYL) 10 MG capsule Take 20 mg by mouth 2 (two) times a day.      finasteride (PROSCAR) 5 MG tablet TAKE ONE TABLET BY MOUTH AT BEDTIME PER CIV UROLOGY. FALLS RISKS DICUSSED WITH VET. HOLD IMDUR AT THIS TIME.     isosorbide mononitrate (IMDUR) 30 MG 24 hr tablet Take 30 mg by mouth 2 (two) times daily.     levothyroxine (SYNTHROID) 75 MCG tablet Take 75 mcg by mouth daily before breakfast.     nystatin cream (MYCOSTATIN) Apply 1 application topically See admin instructions. Apply to penis three times a day     omeprazole (PRILOSEC) 20 MG capsule Take 20 mg by mouth at bedtime.     rosuvastatin (CRESTOR) 20 MG tablet Take by mouth.     tamsulosin (FLOMAX) 0.4 MG CAPS capsule Take 1 capsule (0.4 mg total) by mouth daily after supper. 30 capsule 0   No current facility-administered medications for this visit.    REVIEW OF SYSTEMS:   '[X]'$  denotes positive finding, '[ ]'$  denotes negative finding Cardiac  Comments:  Chest pain or chest pressure:    Shortness of breath upon exertion:    Short of breath when lying flat:    Irregular heart rhythm:        Vascular    Pain in calf, thigh, or hip brought on by ambulation: x   Pain in feet at night that wakes you up from your sleep:     Blood clot in your veins:    Leg swelling:         Pulmonary    Oxygen at home:    Productive cough:     Wheezing:         Neurologic    Sudden weakness in arms or legs:     Sudden numbness in arms or legs:     Sudden onset of difficulty speaking or slurred speech:    Temporary loss of vision in one eye:     Problems with dizziness:         Gastrointestinal    Blood in stool:     Vomited blood:         Genitourinary    Burning when urinating:     Blood in urine:        Psychiatric    Major depression:         Hematologic    Bleeding problems:    Problems with  blood clotting too easily:        Skin    Rashes or ulcers:  Constitutional    Fever or chills:      PHYSICAL EXAM:   Vitals:   06/28/21 1529 06/28/21 1531  BP: (!) 172/84 (!) 180/78  Pulse: 74   Resp: 20   Temp: 98.1 F (36.7 C)   SpO2: 96%   Weight: 158 lb (71.7 kg)   Height: '5\' 7"'$  (1.702 m)     GENERAL: The patient is a well-nourished male, in no acute distress. The vital signs are documented above. CARDIAC: There is a regular rate and rhythm.  VASCULAR: Nonpalpable pedal pulses PULMONARY: Non-labored respirations MUSCULOSKELETAL: There are no major deformities or cyanosis. NEUROLOGIC: No focal weakness or paresthesias are detected. SKIN: There are no ulcers or rashes noted. PSYCHIATRIC: The patient has a normal affect.  STUDIES:   I have reviewed the following studies: Carotid duplex: Right Carotid: Patent right carotid endarterectomy with velocity in the  right                 ICA consistent with a 1-39% stenosis.   Left Carotid: Velocities in the left ICA are consistent with a 1-39%  stenosis.  MEDICAL ISSUES:   Carotid: No significant stenosis on ultrasound today.  He will need follow-up in 1 year.  This study will need to be ordered at his next visit in 6 months  PAD: Patient has a known occlusion of the left limb of his aortobifemoral bypass graft, which is being managed nonoperatively.  He does have claudication symptoms however these are tolerable.  He will follow-up in 6 months with ABIs.  He knows to contact me sooner should he develop a wound that does not heal.    Annamarie Major, IV, MD, FACS Vascular and Vein Specialists of [facility] [phone] Pager [phone]

## 2021-07-01 ENCOUNTER — Other Ambulatory Visit: Payer: Self-pay

## 2021-07-01 DIAGNOSIS — I739 Peripheral vascular disease, unspecified: Secondary | ICD-10-CM

## 2021-12-27 ENCOUNTER — Other Ambulatory Visit: Payer: Self-pay

## 2021-12-27 ENCOUNTER — Ambulatory Visit (HOSPITAL_COMMUNITY)
Admission: RE | Admit: 2021-12-27 | Discharge: 2021-12-27 | Disposition: A | Discharge status: Home / Self Care | Payer: Medicare Other | Source: Ambulatory Visit | Attending: Surgery | Admitting: Surgery

## 2021-12-27 ENCOUNTER — Encounter: Payer: Self-pay | Admitting: Surgery

## 2021-12-27 ENCOUNTER — Ambulatory Visit (INDEPENDENT_AMBULATORY_CARE_PROVIDER_SITE_OTHER): Payer: Medicare Other | Admitting: Surgery

## 2021-12-27 VITALS — BP 158/84 | HR 67 | Temp 97.9°F | Resp 20 | Ht 67.0 in | Wt 165.0 lb

## 2021-12-27 DIAGNOSIS — I6523 Occlusion and stenosis of bilateral carotid arteries: Secondary | ICD-10-CM

## 2021-12-27 DIAGNOSIS — I739 Peripheral vascular disease, unspecified: Secondary | ICD-10-CM | POA: Diagnosis present

## 2021-12-27 DIAGNOSIS — I70213 Atherosclerosis of native arteries of extremities with intermittent claudication, bilateral legs: Secondary | ICD-10-CM

## 2021-12-27 NOTE — Progress Notes (Signed)
? ?Vascular and Vein Specialist of [place] ? ?Patient name: Tyler [name] MRN: [id number] DOB: 1929/02/04 Sex: male ? ? ?REASON FOR VISIT:  ? ? ?Follow up ? ?HISOTRY OF PRESENT ILLNESS:  ? ? ?The patient is back today for followup. He is a former patient of Dr. Amedeo Plenty. He is status post complex aortic reconstruction with an aortobifemoral bypass graft and bilateral renal artery bypass graft. He is also status post right femoral-popliteal bypass graft in 1995 and 2003. I performed a right carotid endarterectomy for asymptomatic stenosis. 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 He underwent angiography on 04/15/2014 to better define this. I found approximately 80% stenosis within the distal limb of the aortobifemoral graft. On 05/27/2014, he underwent drug coated balloon angioplasty of what appeared to be a stenosis within the limb of his aortobifemoral graft. There was a residual stenosis after angioplasty.  He began having worsening left leg symptoms, and so I sent him for CT scan which confirmed occlusion of the left limb of his graft.  He states that his limitations are tolerable, and was not interested in surgical intervention but did want to have this followed. ?  ?His leg will bother him with activity however with resting the discomfort goes away.  He does not have any open wounds ?  ?He is now taking Eliquis for atrial fibrillation.  He is on a statin for hypercholesterolemia.  He is medically managed for hypertension. ? ? ?PAST MEDICAL HISTORY:  ? ?Past Medical History:  ?Diagnosis Date  ? Cancer [facility])   ? kidney  ? Carotid artery occlusion   ? CHF (congestive heart failure) ([place])   ? COPD (chronic obstructive pulmonary disease) ([place])   ? Coronary artery disease   ? DVT (deep venous thrombosis) ([place])   ? Failure to thrive (0-17) 03/2019  ? Hyperlipidemia   ? Hypertension   ? Myocardial infarction [facility]) 2009  ? Peripheral vascular disease ([place])   ?  Ulcer   ? ? ? ?FAMILY HISTORY:  ? ?Family History  ?Problem Relation Age of Onset  ? Heart disease Father   ?     Aneurysm, Abdominal Aortic  ? Hypertension Other   ? Coronary artery disease Other   ? Alzheimer's disease Other   ? Other Other   ?     AAA  ? Arthritis Other   ? Diabetes Other   ? Seizures Other   ? ? ?SOCIAL HISTORY:  ? ?Social History  ? ?Tobacco Use  ? Smoking status: Former  ?  Packs/day: 1.00  ?  Years: 40.00  ?  Pack years: 40.00  ?  Types: Cigarettes  ?  Quit date: 10/11/1999  ?  Years since quitting: 22.2  ? Smokeless tobacco: Never  ?Substance Use Topics  ? Alcohol use: No  ?  Alcohol/week: 0.0 standard drinks  ? ? ? ?ALLERGIES:  ? ?Allergies  ?Allergen Reactions  ? Adhesive [Tape] Other (See Comments)  ?  TEARS AND BRUISES SKIN- PLEASE USE AN ALTERNATIVE LIKE COBAN WRAP!!  ? Buspirone Hives, Itching, Rash and Hypertension  ? Penicillins Swelling and Rash  ?  Has patient had a PCN reaction causing immediate rash, facial/tongue/throat swelling, SOB or lightheadedness with hypotension: Yes ?Has patient had a PCN reaction causing severe rash involving mucus membranes or skin necrosis: No ?Has patient had a PCN reaction that required hospitalization No ?Has patient had a PCN reaction occurring within the last 10 years: No ?If all of the  above answers are "NO", then may proceed with Cephalosporin use. ?  ? ? ? ?CURRENT MEDICATIONS:  ? ?Current Outpatient Medications  ?Medication Sig Dispense Refill  ? amiodarone (PACERONE) 200 MG tablet Take 100 mg by mouth every other day.     ? apixaban (ELIQUIS) 2.5 MG TABS tablet Take by mouth.    ? aspirin EC 81 MG tablet Take 81 mg by mouth daily.    ? carboxymethylcellulose (REFRESH PLUS) 0.5 % SOLN Place 1-2 drops into both eyes See admin instructions. Instill 1-2 drops into each eye 2 times a day (morning and bedtime) and an additional 1-2 drops into each eye daily as needed for dryness    ? carvedilol (COREG) 12.5 MG tablet Take 6.25 mg by mouth 2 (two)  times daily with a meal.    ? clotrimazole-betamethasone (LOTRISONE) cream Apply topically 2 (two) times daily. 30 g 0  ? dicyclomine (BENTYL) 10 MG capsule Take 20 mg by mouth 2 (two) times a day.     ? finasteride (PROSCAR) 5 MG tablet TAKE ONE TABLET BY MOUTH AT BEDTIME PER CIV UROLOGY. FALLS RISKS DICUSSED WITH VET. HOLD IMDUR AT THIS TIME.    ? isosorbide mononitrate (IMDUR) 30 MG 24 hr tablet Take 30 mg by mouth 2 (two) times daily.    ? levothyroxine (SYNTHROID) 75 MCG tablet Take 75 mcg by mouth daily before breakfast.    ? nystatin cream (MYCOSTATIN) Apply 1 application topically See admin instructions. Apply to penis three times a day    ? omeprazole (PRILOSEC) 20 MG capsule Take 20 mg by mouth at bedtime.    ? rosuvastatin (CRESTOR) 20 MG tablet Take by mouth.    ? tamsulosin (FLOMAX) 0.4 MG CAPS capsule Take 1 capsule (0.4 mg total) by mouth daily after supper. 30 capsule 0  ? ?No current facility-administered medications for this visit.  ? ? ?REVIEW OF SYSTEMS:  ? ?'[X]'$  denotes positive finding, '[ ]'$  denotes negative finding ?Cardiac  Comments:  ?Chest pain or chest pressure:    ?Shortness of breath upon exertion:    ?Short of breath when lying flat:    ?Irregular heart rhythm:    ?    ?Vascular    ?Pain in calf, thigh, or hip brought on by ambulation: x   ?Pain in feet at night that wakes you up from your sleep:     ?Blood clot in your veins:    ?Leg swelling:     ?    ?Pulmonary    ?Oxygen at home:    ?Productive cough:     ?Wheezing:     ?    ?Neurologic    ?Sudden weakness in arms or legs:     ?Sudden numbness in arms or legs:     ?Sudden onset of difficulty speaking or slurred speech:    ?Temporary loss of vision in one eye:     ?Problems with dizziness:     ?    ?Gastrointestinal    ?Blood in stool:     ?Vomited blood:     ?    ?Genitourinary    ?Burning when urinating:     ?Blood in urine:    ?    ?Psychiatric    ?Major depression:     ?    ?Hematologic    ?Bleeding problems:    ?Problems with  blood clotting too easily:    ?    ?Skin    ?Rashes or ulcers:    ?    ?  Constitutional    ?Fever or chills:    ? ? ?PHYSICAL EXAM:  ? ?Vitals:  ? 12/27/21 1144  ?BP: (!) 158/84  ?Pulse: 67  ?Resp: 20  ?Temp: 97.9 ?F (36.6 ?C)  ?SpO2: 95%  ?Weight: 165 lb (74.8 kg)  ?Height: '5\' 7"'$  (1.702 m)  ? ? ?GENERAL: The patient is a well-nourished male, in no acute distress. The vital signs are documented above. ?CARDIAC: There is a regular rate and rhythm.  ?VASCULAR: Nonpalpable pedal pulses ?PULMONARY: Non-labored respirations ?ABDOMEN: Soft and non-tender with normal pitched bowel sounds.  ?MUSCULOSKELETAL: There are no major deformities or cyanosis. ?NEUROLOGIC: No focal weakness or paresthesias are detected. ?SKIN: There are no ulcers or rashes noted. ?PSYCHIATRIC: The patient has a normal affect. ? ?STUDIES:  ? ?I have reviewed the following: ?+-------+-----------+-----------+------------+------------+  ?ABI/TBIToday's ABIToday's TBIPrevious ABIPrevious TBI  ?+-------+-----------+-----------+------------+------------+  ?Right  0.73       0.52       0.91        0.69          ?+-------+-----------+-----------+------------+------------+  ?Left   0.62       0.44       0.63        0.63          ?+-------+-----------+-----------+------------+------------+  ?Right toe equals 83 ?Left toe equals 70 ? ?MEDICAL ISSUES:  ? ?Carotid: He will get a repeat duplex in 6 months ? ?PAD: His symptoms remain stable.  I will follow-up with him in 6 months.  He knows to contact me sooner should he develop any open wounds or worsening pain. ? ? ? ?Annamarie Major, IV, MD, FACS ?[facility] of [place] ?Tel [phone] ?Pager [phone]  ?

## 2021-12-31 ENCOUNTER — Other Ambulatory Visit: Payer: Self-pay | Admitting: *Deleted

## 2021-12-31 DIAGNOSIS — I739 Peripheral vascular disease, unspecified: Secondary | ICD-10-CM

## 2021-12-31 DIAGNOSIS — I7409 Other arterial embolism and thrombosis of abdominal aorta: Secondary | ICD-10-CM

## 2021-12-31 DIAGNOSIS — I779 Disorder of arteries and arterioles, unspecified: Secondary | ICD-10-CM

## 2021-12-31 DIAGNOSIS — I6523 Occlusion and stenosis of bilateral carotid arteries: Secondary | ICD-10-CM

## 2022-10-14 ENCOUNTER — Other Ambulatory Visit: Payer: Self-pay | Admitting: *Deleted

## 2022-10-14 DIAGNOSIS — I70213 Atherosclerosis of native arteries of extremities with intermittent claudication, bilateral legs: Secondary | ICD-10-CM

## 2022-10-14 DIAGNOSIS — I739 Peripheral vascular disease, unspecified: Secondary | ICD-10-CM

## 2022-10-14 DIAGNOSIS — I6523 Occlusion and stenosis of bilateral carotid arteries: Secondary | ICD-10-CM

## 2022-10-31 ENCOUNTER — Encounter: Payer: Self-pay | Admitting: Surgery

## 2022-10-31 ENCOUNTER — Ambulatory Visit (HOSPITAL_COMMUNITY)
Admission: RE | Admit: 2022-10-31 | Discharge: 2022-10-31 | Disposition: A | Discharge status: Home / Self Care | Payer: Medicare Other | Source: Ambulatory Visit | Attending: Surgery | Admitting: Surgery

## 2022-10-31 ENCOUNTER — Ambulatory Visit (INDEPENDENT_AMBULATORY_CARE_PROVIDER_SITE_OTHER): Payer: Medicare Other | Admitting: Surgery

## 2022-10-31 ENCOUNTER — Ambulatory Visit (INDEPENDENT_AMBULATORY_CARE_PROVIDER_SITE_OTHER)
Admission: RE | Admit: 2022-10-31 | Discharge: 2022-10-31 | Disposition: A | Discharge status: Home / Self Care | Payer: Medicare Other | Source: Ambulatory Visit | Attending: Surgery | Admitting: Surgery

## 2022-10-31 VITALS — BP 157/77 | HR 69 | Temp 98.0°F | Resp 20 | Ht 67.0 in | Wt 163.0 lb

## 2022-10-31 DIAGNOSIS — I739 Peripheral vascular disease, unspecified: Secondary | ICD-10-CM | POA: Diagnosis present

## 2022-10-31 DIAGNOSIS — I6523 Occlusion and stenosis of bilateral carotid arteries: Secondary | ICD-10-CM

## 2022-10-31 DIAGNOSIS — I7409 Other arterial embolism and thrombosis of abdominal aorta: Secondary | ICD-10-CM

## 2022-10-31 DIAGNOSIS — I70213 Atherosclerosis of native arteries of extremities with intermittent claudication, bilateral legs: Secondary | ICD-10-CM | POA: Diagnosis not present

## 2022-10-31 DIAGNOSIS — I779 Disorder of arteries and arterioles, unspecified: Secondary | ICD-10-CM

## 2022-10-31 LAB — VAS US ABI WITH/WO TBI
Left ABI: 0.49
Right ABI: 0.51

## 2022-10-31 NOTE — Progress Notes (Signed)
Vascular and Vein Specialist of [facility]  Patient name: Tyler [name] MRN: [id number] DOB: 09-26-1929 Sex: male   REASON FOR VISIT:    Follow up  [place]:    The patient is back today for followup. He is a former patient of Dr. Amedeo Plenty. He is status post complex aortic reconstruction with an aortobifemoral bypass graft and bilateral renal artery bypass graft. He is also status post right femoral-popliteal bypass graft in 1995 and 2003. I performed a right carotid endarterectomy for asymptomatic stenosis. 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 He underwent angiography on 04/15/2014 to better define this. I found approximately 80% stenosis within the distal limb of the aortobifemoral graft. On 05/27/2014, he underwent drug coated balloon angioplasty of what appeared to be a stenosis within the limb of his aortobifemoral graft. There was a residual stenosis after angioplasty.  He began having worsening left leg symptoms, and so I sent him for CT scan which confirmed occlusion of the left limb of his graft.  This was in 2021.  He states that his limitations are tolerable, and was not interested in surgical intervention but did want to have this followed.   His leg will bother him with activity however with resting the discomfort goes away.  He does not have any open wounds   He is now taking Eliquis for atrial fibrillation.  He is on a statin for hypercholesterolemia.  He is medically managed for hypertension.   PAST MEDICAL HISTORY:   Past Medical History:  Diagnosis Date   Cancer [facility])    kidney   Carotid artery occlusion    CHF (congestive heart failure) (HCC)    COPD (chronic obstructive pulmonary disease) (HCC)    Coronary artery disease    DVT (deep venous thrombosis) (HCC)    Failure to thrive (0-17) 03/2019   Hyperlipidemia    Hypertension    Myocardial infarction [facility]) 2009   Peripheral  vascular disease ([place])    Ulcer      FAMILY HISTORY:   Family History  Problem Relation Age of Onset   Heart disease Father        Aneurysm, Abdominal Aortic   Hypertension Other    Coronary artery disease Other    Alzheimer's disease Other    Other Other        AAA   Arthritis Other    Diabetes Other    Seizures Other     SOCIAL HISTORY:   Social History   Tobacco Use   Smoking status: Former    Packs/day: 1.00    Years: 40.00    Total pack years: 40.00    Types: Cigarettes    Quit date: 10/11/1999    Years since quitting: 23.0   Smokeless tobacco: Never  Substance Use Topics   Alcohol use: No    Alcohol/week: 0.0 standard drinks of alcohol     ALLERGIES:   Allergies  Allergen Reactions   Adhesive [Tape] Other (See Comments)    TEARS AND BRUISES SKIN- PLEASE USE AN ALTERNATIVE LIKE COBAN WRAP!!   Buspirone Hives, Itching, Rash and Hypertension   Penicillins Swelling and Rash    Has patient had a PCN reaction causing immediate rash, facial/tongue/throat swelling, SOB or lightheadedness with hypotension: Yes Has patient had a PCN reaction causing severe rash involving mucus membranes or skin necrosis: No Has patient had a PCN reaction that required hospitalization No Has patient had a PCN reaction occurring within the  last 10 years: No If all of the above answers are "NO", then may proceed with Cephalosporin use.      CURRENT MEDICATIONS:   Current Outpatient Medications  Medication Sig Dispense Refill   amiodarone (PACERONE) 200 MG tablet Take 100 mg by mouth every other day.      apixaban (ELIQUIS) 2.5 MG TABS tablet Take by mouth.     aspirin EC 81 MG tablet Take 81 mg by mouth daily.     carboxymethylcellulose (REFRESH PLUS) 0.5 % SOLN Place 1-2 drops into both eyes See admin instructions. Instill 1-2 drops into each eye 2 times a day (morning and bedtime) and an additional 1-2 drops into each eye daily as needed for dryness     carvedilol (COREG)  12.5 MG tablet Take 6.25 mg by mouth 2 (two) times daily with a meal.     clotrimazole-betamethasone (LOTRISONE) cream Apply topically 2 (two) times daily. 30 g 0   dicyclomine (BENTYL) 10 MG capsule Take 20 mg by mouth 2 (two) times a day.      finasteride (PROSCAR) 5 MG tablet TAKE ONE TABLET BY MOUTH AT BEDTIME PER CIV UROLOGY. FALLS RISKS DICUSSED WITH VET. HOLD IMDUR AT THIS TIME.     isosorbide mononitrate (IMDUR) 30 MG 24 hr tablet Take 30 mg by mouth 2 (two) times daily.     levothyroxine (SYNTHROID) 75 MCG tablet Take 75 mcg by mouth daily before breakfast.     nystatin cream (MYCOSTATIN) Apply 1 application topically See admin instructions. Apply to penis three times a day     omeprazole (PRILOSEC) 20 MG capsule Take 20 mg by mouth at bedtime.     rosuvastatin (CRESTOR) 20 MG tablet Take by mouth.     tamsulosin (FLOMAX) 0.4 MG CAPS capsule Take 1 capsule (0.4 mg total) by mouth daily after supper. 30 capsule 0   No current facility-administered medications for this visit.    REVIEW OF SYSTEMS:   '[X]'$  denotes positive finding, '[ ]'$  denotes negative finding Cardiac  Comments:  Chest pain or chest pressure:    Shortness of breath upon exertion:    Short of breath when lying flat:    Irregular heart rhythm:        Vascular    Pain in calf, thigh, or hip brought on by ambulation: x   Pain in feet at night that wakes you up from your sleep:     Blood clot in your veins:    Leg swelling:         Pulmonary    Oxygen at home:    Productive cough:     Wheezing:         Neurologic    Sudden weakness in arms or legs:     Sudden numbness in arms or legs:     Sudden onset of difficulty speaking or slurred speech:    Temporary loss of vision in one eye:     Problems with dizziness:         Gastrointestinal    Blood in stool:     Vomited blood:         Genitourinary    Burning when urinating:     Blood in urine:        Psychiatric    Major depression:         Hematologic     Bleeding problems:    Problems with blood clotting too easily:        Skin  Rashes or ulcers:        Constitutional    Fever or chills:      PHYSICAL EXAM:   Vitals:   10/31/22 1446 10/31/22 1448  BP: (!) 154/68 (!) 157/77  Pulse: 69   Resp: 20   Temp: 98 F (36.7 C)   SpO2: 96%   Weight: 163 lb (73.9 kg)   Height: '5\' 7"'$  (1.702 m)     GENERAL: The patient is a well-nourished male, in no acute distress. The vital signs are documented above. CARDIAC: There is a regular rate and rhythm.  PULMONARY: Non-labored respirations MUSCULOSKELETAL: There are no major deformities or cyanosis. NEUROLOGIC: No focal weakness or paresthesias are detected. SKIN: There are no ulcers or rashes noted. PSYCHIATRIC: The patient has a normal affect.  STUDIES:   I have reviewed the following:  +-------+-----------+-----------+------------+------------+  ABI/TBIToday's ABIToday's TBIPrevious ABIPrevious TBI  +-------+-----------+-----------+------------+------------+  Right 0.51       0.36       0.73        0.52          +-------+-----------+-----------+------------+------------+  Left  0.49       0.36       0.62        0.44          +-------+-----------+-----------+------------+------------+   Carotid: 1-39% right-sided stenosis, 40-59% left-sided stenosis  MEDICAL ISSUES:   PAD: The patient remains stable regarding his symptoms.  He can walk approximately 25-30 yards before he has to stop.  He does not have any nonhealing wounds.  He knows to contact me should his symptoms change or he develops a nonhealing wound.  Otherwise I will see him back in 6 months  Carotid: Slight progression of disease on the left however he remains asymptomatic.  This will need to be repeated in 1 year.    Leia Alf, MD, FACS Vascular and Vein Specialists of [facility] [phone] Pager [phone]

## 2022-11-02 ENCOUNTER — Other Ambulatory Visit: Payer: Self-pay

## 2022-11-02 DIAGNOSIS — I779 Disorder of arteries and arterioles, unspecified: Secondary | ICD-10-CM

## 2022-11-02 DIAGNOSIS — I70213 Atherosclerosis of native arteries of extremities with intermittent claudication, bilateral legs: Secondary | ICD-10-CM

## 2023-04-10 ENCOUNTER — Ambulatory Visit (INDEPENDENT_AMBULATORY_CARE_PROVIDER_SITE_OTHER): Payer: Medicare Other | Admitting: Surgery

## 2023-04-10 ENCOUNTER — Ambulatory Visit (HOSPITAL_COMMUNITY)
Admission: RE | Admit: 2023-04-10 | Discharge: 2023-04-10 | Disposition: A | Discharge status: Home / Self Care | Payer: Medicare Other | Source: Ambulatory Visit | Attending: Surgery | Admitting: Surgery

## 2023-04-10 ENCOUNTER — Encounter: Payer: Self-pay | Admitting: Surgery

## 2023-04-10 VITALS — BP 131/69 | HR 61 | Temp 98.0°F | Resp 20 | Ht 67.0 in | Wt 161.0 lb

## 2023-04-10 DIAGNOSIS — I70213 Atherosclerosis of native arteries of extremities with intermittent claudication, bilateral legs: Secondary | ICD-10-CM | POA: Diagnosis not present

## 2023-04-10 DIAGNOSIS — I779 Disorder of arteries and arterioles, unspecified: Secondary | ICD-10-CM | POA: Diagnosis not present

## 2023-04-10 NOTE — Progress Notes (Signed)
Vascular and Vein Specialist of [facility]  Patient name: Tyler [name] MRN: [id number] DOB: 03/02/1929 Sex: male   REASON FOR VISIT:    Follow up  HISOTRY OF PRESENT ILLNESS:    The patient is back today for followup. He is a former patient of Dr. Madilyn Fireman. He is status post complex aortic reconstruction with an aortobifemoral bypass graft and bilateral renal artery bypass graft. He is also status post right femoral-popliteal bypass graft in 1995 and 2003. I performed a right carotid endarterectomy for asymptomatic stenosis. 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 He underwent angiography on 04/15/2014 to better define this. I found approximately 80% stenosis within the distal limb of the aortobifemoral graft. On 05/27/2014, he underwent drug coated balloon angioplasty of what appeared to be a stenosis within the limb of his aortobifemoral graft. There was a residual stenosis after angioplasty.  He began having worsening left leg symptoms, and so I sent him for CT scan which confirmed occlusion of the left limb of his graft.  This was in 2021.  He states that his limitations are tolerable, and was not interested in surgical intervention but did want to have this followed.   His leg will bother him with activity however with resting the discomfort goes away.  He does not have any open wounds   He is now taking Eliquis for atrial fibrillation.  He is on a statin for hypercholesterolemia.  He is medically managed for hypertension.  PAST MEDICAL HISTORY:   Past Medical History:  Diagnosis Date   Cancer [facility])    kidney   Carotid artery occlusion    CHF (congestive heart failure) (HCC)    COPD (chronic obstructive pulmonary disease) (HCC)    Coronary artery disease    DVT (deep venous thrombosis) (HCC)    Failure to thrive (0-17) 03/2019   Hyperlipidemia    Hypertension    Myocardial infarction [facility]) 2009   Peripheral vascular  disease (HCC)    Ulcer      FAMILY HISTORY:   Family History  Problem Relation Age of Onset   Heart disease Father        Aneurysm, Abdominal Aortic   Hypertension Other    Coronary artery disease Other    Alzheimer's disease Other    Other Other        AAA   Arthritis Other    Diabetes Other    Seizures Other     SOCIAL HISTORY:   Social History   Tobacco Use   Smoking status: Former    Packs/day: 1.00    Years: 40.00    Additional pack years: 0.00    Total pack years: 40.00    Types: Cigarettes    Quit date: 10/11/1999    Years since quitting: 23.5   Smokeless tobacco: Never  Substance Use Topics   Alcohol use: No    Alcohol/week: 0.0 standard drinks of alcohol     ALLERGIES:   Allergies  Allergen Reactions   Adhesive [Tape] Other (See Comments)    TEARS AND BRUISES SKIN- PLEASE USE AN ALTERNATIVE LIKE COBAN WRAP!!   Buspirone Hives, Itching, Rash and Hypertension   Penicillins Swelling and Rash    Has patient had a PCN reaction causing immediate rash, facial/tongue/throat swelling, SOB or lightheadedness with hypotension: Yes Has patient had a PCN reaction causing severe rash involving mucus membranes or skin necrosis: No Has patient had a PCN reaction that required hospitalization No Has patient had  a PCN reaction occurring within the last 10 years: No If all of the above answers are "NO", then may proceed with Cephalosporin use.      CURRENT MEDICATIONS:   Current Outpatient Medications  Medication Sig Dispense Refill   amiodarone (PACERONE) 200 MG tablet Take 100 mg by mouth every other day.      apixaban (ELIQUIS) 2.5 MG TABS tablet Take by mouth.     aspirin EC 81 MG tablet Take 81 mg by mouth daily.     carboxymethylcellulose (REFRESH PLUS) 0.5 % SOLN Place 1-2 drops into both eyes See admin instructions. Instill 1-2 drops into each eye 2 times a day (morning and bedtime) and an additional 1-2 drops into each eye daily as needed for dryness      carvedilol (COREG) 12.5 MG tablet Take 6.25 mg by mouth 2 (two) times daily with a meal.     clotrimazole-betamethasone (LOTRISONE) cream Apply topically 2 (two) times daily. 30 g 0   dicyclomine (BENTYL) 10 MG capsule Take 20 mg by mouth 2 (two) times a day.      finasteride (PROSCAR) 5 MG tablet TAKE ONE TABLET BY MOUTH AT BEDTIME PER CIV UROLOGY. FALLS RISKS DICUSSED WITH VET. HOLD IMDUR AT THIS TIME.     isosorbide mononitrate (IMDUR) 30 MG 24 hr tablet Take 30 mg by mouth 2 (two) times daily.     levothyroxine (SYNTHROID) 75 MCG tablet Take 75 mcg by mouth daily before breakfast.     nystatin cream (MYCOSTATIN) Apply 1 application topically See admin instructions. Apply to penis three times a day     omeprazole (PRILOSEC) 20 MG capsule Take 20 mg by mouth at bedtime.     rosuvastatin (CRESTOR) 20 MG tablet Take by mouth.     tamsulosin (FLOMAX) 0.4 MG CAPS capsule Take 1 capsule (0.4 mg total) by mouth daily after supper. 30 capsule 0   No current facility-administered medications for this visit.    REVIEW OF SYSTEMS:   [X]  denotes positive finding, [ ]  denotes negative finding Cardiac  Comments:  Chest pain or chest pressure:    Shortness of breath upon exertion:    Short of breath when lying flat:    Irregular heart rhythm:        Vascular    Pain in calf, thigh, or hip brought on by ambulation: x   Pain in feet at night that wakes you up from your sleep:     Blood clot in your veins:    Leg swelling:         Pulmonary    Oxygen at home:    Productive cough:     Wheezing:         Neurologic    Sudden weakness in arms or legs:     Sudden numbness in arms or legs:     Sudden onset of difficulty speaking or slurred speech:    Temporary loss of vision in one eye:     Problems with dizziness:         Gastrointestinal    Blood in stool:     Vomited blood:         Genitourinary    Burning when urinating:     Blood in urine:        Psychiatric    Major depression:          Hematologic    Bleeding problems:    Problems with blood clotting too easily:  Skin    Rashes or ulcers:        Constitutional    Fever or chills:      PHYSICAL EXAM:   Vitals:   04/10/23 1136  BP: 131/69  Pulse: 61  Resp: 20  Temp: 98 F (36.7 C)  SpO2: 96%  Weight: 161 lb (73 kg)  Height: 5\' 7"  (1.702 m)    GENERAL: The patient is a well-nourished male, in no acute distress. The vital signs are documented above. CARDIAC: There is a regular rate and rhythm.  VASCULAR: Nonpalpable pedal pulses PULMONARY: Non-labored respirations MUSCULOSKELETAL: There are no major deformities or cyanosis. NEUROLOGIC: No focal weakness or paresthesias are detected. SKIN: There are no ulcers or rashes noted. PSYCHIATRIC: The patient has a normal affect.  STUDIES:   I have reviewed the following: ABI/TBIToday's ABIToday's TBIPrevious ABIPrevious TBI  +-------+-----------+-----------+------------+------------+  Right 0.84       0.67       0.51        0.36          +-------+-----------+-----------+------------+------------+  Left  0.48       0.40       0.49        0.36          +-------+-----------+-----------+------------+------------+   MEDICAL ISSUES:   PAD: The patient has a occlusion of his left flank bifemoral graft.  He is not interested in surgical correction.  His claudication symptoms remain stable.  He knows to contact me should he develop rest pain or nonhealing wounds.  Otherwise he will return in 6 months for follow-up ABIs    Charlena Cross, MD, FACS Vascular and Vein Specialists of [facility] [phone] Pager [phone]

## 2023-04-11 LAB — VAS US ABI WITH/WO TBI
Left ABI: 0.48
Right ABI: 0.84

## 2023-04-22 ENCOUNTER — Other Ambulatory Visit: Payer: Self-pay

## 2023-04-22 DIAGNOSIS — I779 Disorder of arteries and arterioles, unspecified: Secondary | ICD-10-CM

## 2023-10-23 ENCOUNTER — Ambulatory Visit: Payer: BLUE CROSS/BLUE SHIELD | Admitting: Surgery

## 2023-10-23 ENCOUNTER — Encounter (HOSPITAL_COMMUNITY): Payer: BLUE CROSS/BLUE SHIELD

## 2023-12-04 ENCOUNTER — Ambulatory Visit (INDEPENDENT_AMBULATORY_CARE_PROVIDER_SITE_OTHER): Payer: Medicare Other | Admitting: Surgery

## 2023-12-04 ENCOUNTER — Ambulatory Visit (HOSPITAL_COMMUNITY)
Admission: RE | Admit: 2023-12-04 | Discharge: 2023-12-04 | Disposition: A | Discharge status: Home / Self Care | Payer: Medicare Other | Source: Ambulatory Visit | Attending: Surgery | Admitting: Surgery

## 2023-12-04 ENCOUNTER — Encounter: Payer: Self-pay | Admitting: Surgery

## 2023-12-04 VITALS — BP 126/63 | HR 67 | Temp 97.9°F | Resp 20 | Ht 67.0 in | Wt 162.0 lb

## 2023-12-04 DIAGNOSIS — I6523 Occlusion and stenosis of bilateral carotid arteries: Secondary | ICD-10-CM | POA: Diagnosis not present

## 2023-12-04 DIAGNOSIS — I779 Disorder of arteries and arterioles, unspecified: Secondary | ICD-10-CM | POA: Diagnosis not present

## 2023-12-04 DIAGNOSIS — I70213 Atherosclerosis of native arteries of extremities with intermittent claudication, bilateral legs: Secondary | ICD-10-CM

## 2023-12-04 LAB — VAS US ABI WITH/WO TBI
Left ABI: 0.52
Right ABI: 0.8

## 2023-12-04 NOTE — Progress Notes (Signed)
 Vascular and Vein Specialist of [facility]  Patient name: Tyler [name] MRN: [id number] DOB: [birth year] Sex: male   REASON FOR VISIT:    Follow-up  HISOTRY OF PRESENT ILLNESS:    The patient is back today for followup. He is a former patient of Dr. Madilyn Fireman. He is status post complex aortic reconstruction with an aortobifemoral bypass graft and bilateral renal artery bypass graft. He is also status post right femoral-popliteal bypass graft in 1995 and 2003. I performed a right carotid endarterectomy for asymptomatic stenosis. 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 He underwent angiography on 04/15/2014 to better define this. I found approximately 80% stenosis within the distal limb of the aortobifemoral graft. On 05/27/2014, he underwent drug coated balloon angioplasty of what appeared to be a stenosis within the limb of his aortobifemoral graft. There was a residual stenosis after angioplasty.  He began having worsening left leg symptoms, and so I sent him for CT scan which confirmed occlusion of the left limb of his graft.  This was in 2021.  He states that his limitations are tolerable, and was not interested in surgical intervention but did want to have this followed.   His leg will bother him with activity however with resting the discomfort goes away.  He does not have any open wounds   He is now taking Eliquis for atrial fibrillation.  He is on a statin for hypercholesterolemia.  He is medically managed for hypertension.    PAST MEDICAL HISTORY:   Past Medical History:  Diagnosis Date   Cancer [facility])    kidney   Carotid artery occlusion    CHF (congestive heart failure) (HCC)    COPD (chronic obstructive pulmonary disease) (HCC)    Coronary artery disease    DVT (deep venous thrombosis) (HCC)    Failure to thrive (0-17) 03/2019   Hyperlipidemia    Hypertension    Myocardial infarction [facility]) 2009   Peripheral  vascular disease (HCC)    Ulcer      FAMILY HISTORY:   Family History  Problem Relation Age of Onset   Heart disease Father        Aneurysm, Abdominal Aortic   Hypertension Other    Coronary artery disease Other    Alzheimer's disease Other    Other Other        AAA   Arthritis Other    Diabetes Other    Seizures Other     SOCIAL HISTORY:   Social History   Tobacco Use   Smoking status: Former    Current packs/day: 0.00    Average packs/day: 1 pack/day for 40.0 years (40.0 ttl pk-yrs)    Types: Cigarettes    Start date: 10/11/1959    Quit date: 10/11/1999    Years since quitting: 24.1   Smokeless tobacco: Never  Substance Use Topics   Alcohol use: No    Alcohol/week: 0.0 standard drinks of alcohol     ALLERGIES:   Allergies  Allergen Reactions   Adhesive [Tape] Other (See Comments)    TEARS AND BRUISES SKIN- PLEASE USE AN ALTERNATIVE LIKE COBAN WRAP!!   Buspirone Hives, Itching, Rash and Hypertension   Penicillins Swelling and Rash    Has patient had a PCN reaction causing immediate rash, facial/tongue/throat swelling, SOB or lightheadedness with hypotension: Yes Has patient had a PCN reaction causing severe rash involving mucus membranes or skin necrosis: No Has patient had a PCN reaction that required hospitalization No Has  patient had a PCN reaction occurring within the last 10 years: No If all of the above answers are "NO", then may proceed with Cephalosporin use.      CURRENT MEDICATIONS:   Current Outpatient Medications  Medication Sig Dispense Refill   amiodarone (PACERONE) 200 MG tablet Take 100 mg by mouth every other day.      apixaban (ELIQUIS) 2.5 MG TABS tablet Take by mouth.     aspirin EC 81 MG tablet Take 81 mg by mouth daily.     carboxymethylcellulose (REFRESH PLUS) 0.5 % SOLN Place 1-2 drops into both eyes See admin instructions. Instill 1-2 drops into each eye 2 times a day (morning and bedtime) and an additional 1-2 drops into each eye  daily as needed for dryness     carvedilol (COREG) 12.5 MG tablet Take 6.25 mg by mouth 2 (two) times daily with a meal.     clotrimazole-betamethasone (LOTRISONE) cream Apply topically 2 (two) times daily. 30 g 0   dicyclomine (BENTYL) 10 MG capsule Take 20 mg by mouth 2 (two) times a day.      finasteride (PROSCAR) 5 MG tablet TAKE ONE TABLET BY MOUTH AT BEDTIME PER CIV UROLOGY. FALLS RISKS DICUSSED WITH VET. HOLD IMDUR AT THIS TIME.     isosorbide mononitrate (IMDUR) 30 MG 24 hr tablet Take 30 mg by mouth 2 (two) times daily.     levothyroxine (SYNTHROID) 75 MCG tablet Take 75 mcg by mouth daily before breakfast.     nystatin cream (MYCOSTATIN) Apply 1 application topically See admin instructions. Apply to penis three times a day     omeprazole (PRILOSEC) 20 MG capsule Take 20 mg by mouth at bedtime.     rosuvastatin (CRESTOR) 20 MG tablet Take by mouth.     tamsulosin (FLOMAX) 0.4 MG CAPS capsule Take 1 capsule (0.4 mg total) by mouth daily after supper. 30 capsule 0   No current facility-administered medications for this visit.    REVIEW OF SYSTEMS:   [X]  denotes positive finding, [ ]  denotes negative finding Cardiac  Comments:  Chest pain or chest pressure:    Shortness of breath upon exertion:    Short of breath when lying flat:    Irregular heart rhythm:        Vascular    Pain in calf, thigh, or hip brought on by ambulation:    Pain in feet at night that wakes you up from your sleep:     Blood clot in your veins:    Leg swelling:         Pulmonary    Oxygen at home:    Productive cough:     Wheezing:         Neurologic    Sudden weakness in arms or legs:     Sudden numbness in arms or legs:     Sudden onset of difficulty speaking or slurred speech:    Temporary loss of vision in one eye:     Problems with dizziness:         Gastrointestinal    Blood in stool:     Vomited blood:         Genitourinary    Burning when urinating:     Blood in urine:         Psychiatric    Major depression:         Hematologic    Bleeding problems:    Problems with blood clotting too easily:  Skin    Rashes or ulcers:        Constitutional    Fever or chills:      PHYSICAL EXAM:   There were no vitals filed for this visit.  GENERAL: The patient is a well-nourished male, in no acute distress. The vital signs are documented above. CARDIAC: There is a regular rate and rhythm.  PULMONARY: Non-labored respirations ABDOMEN: Soft and non-tender  MUSCULOSKELETAL: There are no major deformities or cyanosis. NEUROLOGIC: No focal weakness or paresthesias are detected. SKIN: There are no ulcers or rashes noted. PSYCHIATRIC: The patient has a normal affect.  STUDIES:   I have reviewed the following   :  ABI/TBIToday's ABIToday's TBIPrevious ABIPrevious TBI  +-------+-----------+-----------+------------+------------+  Right 0.80       0.56       0.84        0.67          +-------+-----------+-----------+------------+------------+  Left  0.52       0.36       0.48        0.40          +-------+-----------+-----------+------------+------------+    MEDICAL ISSUES:   PAD: Patient' s ABIs remained stable  His symptoms are roughly the same although may be slightly worse, however he still is not interested in surgical intervention.  He knows to contact me should he change his mind or if he develops a nonhealing wound.  Otherwise he will follow-up in 6 months for repeat evaluation.  I will check carotid Doppler studies at his next visit.    Charlena Cross, MD, FACS Vascular and Vein Specialists of [facility] [phone] Pager [phone]

## 2023-12-07 ENCOUNTER — Other Ambulatory Visit: Payer: Self-pay | Admitting: *Deleted

## 2023-12-07 DIAGNOSIS — I70213 Atherosclerosis of native arteries of extremities with intermittent claudication, bilateral legs: Secondary | ICD-10-CM

## 2023-12-07 DIAGNOSIS — I6523 Occlusion and stenosis of bilateral carotid arteries: Secondary | ICD-10-CM

## 2024-06-03 ENCOUNTER — Encounter: Payer: Self-pay | Admitting: Surgery

## 2024-06-03 ENCOUNTER — Ambulatory Visit (HOSPITAL_BASED_OUTPATIENT_CLINIC_OR_DEPARTMENT_OTHER)
Admission: RE | Admit: 2024-06-03 | Discharge: 2024-06-03 | Disposition: A | Discharge status: Home / Self Care | Payer: BLUE CROSS/BLUE SHIELD | Source: Ambulatory Visit | Attending: Surgery | Admitting: Surgery

## 2024-06-03 ENCOUNTER — Ambulatory Visit (HOSPITAL_COMMUNITY)
Admission: RE | Admit: 2024-06-03 | Discharge: 2024-06-03 | Disposition: A | Discharge status: Home / Self Care | Payer: BLUE CROSS/BLUE SHIELD | Source: Ambulatory Visit | Attending: Surgery | Admitting: Surgery

## 2024-06-03 ENCOUNTER — Ambulatory Visit: Payer: BLUE CROSS/BLUE SHIELD | Admitting: Surgery

## 2024-06-03 VITALS — BP 118/68 | HR 81 | Temp 97.7°F | Ht 67.0 in | Wt 148.0 lb

## 2024-06-03 DIAGNOSIS — I70213 Atherosclerosis of native arteries of extremities with intermittent claudication, bilateral legs: Secondary | ICD-10-CM | POA: Insufficient documentation

## 2024-06-03 DIAGNOSIS — I6523 Occlusion and stenosis of bilateral carotid arteries: Secondary | ICD-10-CM | POA: Diagnosis present

## 2024-06-03 LAB — VAS US ABI WITH/WO TBI
Left ABI: 0.72
Right ABI: 0.84

## 2024-06-03 NOTE — Progress Notes (Signed)
 Vascular and Vein Specialist of [facility]  Patient name: Tyler [name] MRN: [id number] DOB: Sep 11, 1929 Sex: male   REASON FOR VISIT:    Follow-up  HISOTRY OF PRESENT ILLNESS:   The patient is back today for followup. He is a former patient of Dr. Dyane. He is status post complex aortic reconstruction with an aortobifemoral bypass graft and bilateral renal artery bypass graft. He is also status post right femoral-popliteal bypass graft in 1995 and 2003. I performed a right carotid endarterectomy for asymptomatic stenosis. 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 He underwent angiography on 04/15/2014 to better define this. I found approximately 80% stenosis within the distal limb of the aortobifemoral graft. On 05/27/2014, he underwent drug coated balloon angioplasty of what appeared to be a stenosis within the limb of his aortobifemoral graft. There was a residual stenosis after angioplasty.  He began having worsening left leg symptoms, and so I sent him for CT scan which confirmed occlusion of the left limb of his graft.  This was in 2021.  He states that his limitations are tolerable, and was not interested in surgical intervention but did want to have this followed.  He is now having worsening left leg symptoms to where he is becoming significantly challenging for him.  However he is also having issues with abdominal bloating, discomfort and weight loss.  He does not have rest pain or nonhealing wounds   His leg will bother him with activity however with resting the discomfort goes away.  He does not have any open wounds   He is now taking Eliquis for atrial fibrillation.  He is on a statin for hypercholesterolemia.  He is medically managed for hypertension.    PAST MEDICAL HISTORY:   Past Medical History:  Diagnosis Date   Cancer [facility])    kidney   Carotid artery occlusion    CHF (congestive heart failure) (HCC)    COPD  (chronic obstructive pulmonary disease) (HCC)    Coronary artery disease    DVT (deep venous thrombosis) (HCC)    Failure to thrive (0-17) 03/2019   Hyperlipidemia    Hypertension    Myocardial infarction [facility]) 2009   Peripheral vascular disease (HCC)    Ulcer      FAMILY HISTORY:   Family History  Problem Relation Age of Onset   Heart disease Father        Aneurysm, Abdominal Aortic   Hypertension Other    Coronary artery disease Other    Alzheimer's disease Other    Other Other        AAA   Arthritis Other    Diabetes Other    Seizures Other     SOCIAL HISTORY:   Social History   Tobacco Use   Smoking status: Former    Current packs/day: 0.00    Average packs/day: 1 pack/day for 40.0 years (40.0 ttl pk-yrs)    Types: Cigarettes    Start date: 10/11/1959    Quit date: 10/11/1999    Years since quitting: 24.6   Smokeless tobacco: Never  Substance Use Topics   Alcohol use: No    Alcohol/week: 0.0 standard drinks of alcohol     ALLERGIES:   Allergies  Allergen Reactions   Adhesive [Tape] Other (See Comments)    TEARS AND BRUISES SKIN- PLEASE USE AN ALTERNATIVE LIKE COBAN WRAP!!   Buspirone Hives, Itching, Rash and Hypertension   Penicillins Swelling and Rash    Has patient had  a PCN reaction causing immediate rash, facial/tongue/throat swelling, SOB or lightheadedness with hypotension: Yes Has patient had a PCN reaction causing severe rash involving mucus membranes or skin necrosis: No Has patient had a PCN reaction that required hospitalization No Has patient had a PCN reaction occurring within the last 10 years: No If all of the above answers are NO, then may proceed with Cephalosporin use.      CURRENT MEDICATIONS:   Current Outpatient Medications  Medication Sig Dispense Refill   apixaban (ELIQUIS) 2.5 MG TABS tablet Take by mouth.     aspirin  EC 81 MG tablet Take 81 mg by mouth daily.     carboxymethylcellulose (REFRESH PLUS) 0.5 % SOLN Place 1-2  drops into both eyes See admin instructions. Instill 1-2 drops into each eye 2 times a day (morning and bedtime) and an additional 1-2 drops into each eye daily as needed for dryness     carvedilol  (COREG ) 12.5 MG tablet Take 6.25 mg by mouth 2 (two) times daily with a meal.     clotrimazole -betamethasone  (LOTRISONE ) cream Apply topically 2 (two) times daily. 30 g 0   dicyclomine  (BENTYL ) 10 MG capsule Take 20 mg by mouth 2 (two) times a day.      finasteride (PROSCAR) 5 MG tablet TAKE ONE TABLET BY MOUTH AT BEDTIME PER CIV UROLOGY. FALLS RISKS DICUSSED WITH VET. HOLD IMDUR AT THIS TIME.     levothyroxine  (SYNTHROID ) 75 MCG tablet Take 75 mcg by mouth daily before breakfast.     nystatin cream (MYCOSTATIN) Apply 1 application topically See admin instructions. Apply to penis three times a day     omeprazole (PRILOSEC) 20 MG capsule Take 20 mg by mouth at bedtime.     rosuvastatin (CRESTOR) 20 MG tablet Take by mouth.     tamsulosin  (FLOMAX ) 0.4 MG CAPS capsule Take 1 capsule (0.4 mg total) by mouth daily after supper. 30 capsule 0   amiodarone (PACERONE) 200 MG tablet Take 100 mg by mouth every other day.  (Patient not taking: Reported on 06/03/2024)     isosorbide mononitrate (IMDUR) 30 MG 24 hr tablet Take 30 mg by mouth 2 (two) times daily. (Patient not taking: Reported on 06/03/2024)     No current facility-administered medications for this visit.    REVIEW OF SYSTEMS:   [X]  denotes positive finding, [ ]  denotes negative finding Cardiac  Comments:  Chest pain or chest pressure:    Shortness of breath upon exertion:    Short of breath when lying flat:    Irregular heart rhythm:        Vascular    Pain in calf, thigh, or hip brought on by ambulation:    Pain in feet at night that wakes you up from your sleep:     Blood clot in your veins:    Leg swelling:         Pulmonary    Oxygen at home:    Productive cough:     Wheezing:         Neurologic    Sudden weakness in arms or legs:      Sudden numbness in arms or legs:     Sudden onset of difficulty speaking or slurred speech:    Temporary loss of vision in one eye:     Problems with dizziness:         Gastrointestinal    Blood in stool:     Vomited blood:  Genitourinary    Burning when urinating:     Blood in urine:        Psychiatric    Major depression:         Hematologic    Bleeding problems:    Problems with blood clotting too easily:        Skin    Rashes or ulcers:        Constitutional    Fever or chills:      PHYSICAL EXAM:   Vitals:   06/03/24 1401  BP: 118/68  Pulse: 81  Temp: 97.7 F (36.5 C)  SpO2: 94%  Weight: 148 lb (67.1 kg)  Height: 5' 7 (1.702 m)    GENERAL: The patient is a well-nourished male, in no acute distress. The vital signs are documented above. CARDIAC: There is a regular rate and rhythm.  PULMONARY: Non-labored respirations MUSCULOSKELETAL: There are no major deformities or cyanosis. NEUROLOGIC: No focal weakness or paresthesias are detected. SKIN: There are no ulcers or rashes noted. PSYCHIATRIC: The patient has a normal affect.  STUDIES:   I have reviewed the following: ABI/TBIToday's ABIToday's TBIPrevious ABIPrevious TBI  +-------+-----------+-----------+------------+------------+  Right 0.84       0.51       0.80        0.56          +-------+-----------+-----------+------------+------------+  Left  0.72       0.38       0.52        0.36          +-------+-----------+-----------+------------+------------+   Carotid: Right Carotid: Velocities in the right ICA are consistent with a 1-39%  stenosis.   Left Carotid: Velocities in the left ICA are consistent with a 1-39%  stenosis.   Vertebrals: Bilateral vertebral arteries demonstrate antegrade flow.  Subclavians: Normal flow hemodynamics were seen in bilateral subclavian               arteries.   MEDICAL ISSUES:   PAD: The patient's quality of life is now being  significantly impacted by his claudication symptoms.  Unfortunately, he is also dealing with some GI issues leading to abdominal bloating and weight loss.  I have told him that he remains at high risk for surgery because of his age and so we have to have everything optimized before considering surgery.  He is scheduled to see GI the next month.  I will have him return in 6 months for further discussions.  If he has been optimized, the best operation would be a right to left femoral-femoral bypass  Carotid: No significant stenosis visualized.  He will need imaging again in 2 years    Malvina New, IV, MD, FACS [facility] [phone] Pager [phone]

## 2024-06-05 ENCOUNTER — Other Ambulatory Visit: Payer: Self-pay

## 2024-06-05 DIAGNOSIS — I70213 Atherosclerosis of native arteries of extremities with intermittent claudication, bilateral legs: Secondary | ICD-10-CM

## 2024-12-02 ENCOUNTER — Encounter (HOSPITAL_COMMUNITY)

## 2024-12-02 ENCOUNTER — Ambulatory Visit: Admitting: Surgery
